# Patient Record
Sex: Female | Born: 1950 | Race: White | Hispanic: No | Smoking: Never smoker
Health system: Southern US, Community
[De-identification: ages and names within clinical notes are randomized; demographics above are authoritative.]

## PROBLEM LIST (undated history)

## (undated) DIAGNOSIS — K148 Other diseases of tongue: Secondary | ICD-10-CM

## (undated) DIAGNOSIS — M87051 Idiopathic aseptic necrosis of right femur: Secondary | ICD-10-CM

## (undated) DIAGNOSIS — F1020 Alcohol dependence, uncomplicated: Secondary | ICD-10-CM

## (undated) DIAGNOSIS — I10 Essential (primary) hypertension: Secondary | ICD-10-CM

## (undated) DIAGNOSIS — M87052 Idiopathic aseptic necrosis of left femur: Secondary | ICD-10-CM

## (undated) DIAGNOSIS — D649 Anemia, unspecified: Secondary | ICD-10-CM

## (undated) DIAGNOSIS — E785 Hyperlipidemia, unspecified: Secondary | ICD-10-CM

## (undated) DIAGNOSIS — S0285XA Fracture of orbit, unspecified, initial encounter for closed fracture: Secondary | ICD-10-CM

## (undated) DIAGNOSIS — F01518 Vascular dementia, unspecified severity, with other behavioral disturbance: Secondary | ICD-10-CM

## (undated) DIAGNOSIS — F819 Developmental disorder of scholastic skills, unspecified: Secondary | ICD-10-CM

## (undated) HISTORY — PX: OTHER SURGICAL HISTORY: SHX169

---

## 1999-09-01 ENCOUNTER — Ambulatory Visit (HOSPITAL_COMMUNITY): Admission: RE | Admit: 1999-09-01 | Discharge: 1999-09-01 | Payer: Self-pay | Admitting: Family Medicine

## 1999-09-01 ENCOUNTER — Encounter: Payer: Self-pay | Admitting: Family Medicine

## 1999-09-05 ENCOUNTER — Other Ambulatory Visit: Admission: RE | Admit: 1999-09-05 | Discharge: 1999-09-05 | Payer: Self-pay | Admitting: *Deleted

## 1999-09-05 ENCOUNTER — Encounter: Admission: RE | Admit: 1999-09-05 | Discharge: 1999-09-05 | Payer: Self-pay | Admitting: Obstetrics & Gynecology

## 1999-09-06 ENCOUNTER — Ambulatory Visit (HOSPITAL_COMMUNITY): Admission: RE | Admit: 1999-09-06 | Discharge: 1999-09-06 | Payer: Self-pay | Admitting: Family Medicine

## 1999-09-06 ENCOUNTER — Encounter: Payer: Self-pay | Admitting: Family Medicine

## 2002-04-06 ENCOUNTER — Ambulatory Visit (HOSPITAL_COMMUNITY): Admission: RE | Admit: 2002-04-06 | Discharge: 2002-04-06 | Payer: Self-pay | Admitting: Family Medicine

## 2002-04-06 ENCOUNTER — Encounter: Payer: Self-pay | Admitting: Family Medicine

## 2002-04-28 ENCOUNTER — Ambulatory Visit (HOSPITAL_COMMUNITY): Admission: RE | Admit: 2002-04-28 | Discharge: 2002-04-28 | Payer: Self-pay | Admitting: Family Medicine

## 2002-04-28 ENCOUNTER — Encounter: Payer: Self-pay | Admitting: Family Medicine

## 2007-03-11 ENCOUNTER — Emergency Department (HOSPITAL_COMMUNITY): Admission: EM | Admit: 2007-03-11 | Discharge: 2007-03-11 | Payer: Self-pay | Admitting: Emergency Medicine

## 2012-05-21 ENCOUNTER — Other Ambulatory Visit (HOSPITAL_COMMUNITY): Payer: Self-pay | Admitting: Family Medicine

## 2012-05-21 DIAGNOSIS — Z1231 Encounter for screening mammogram for malignant neoplasm of breast: Secondary | ICD-10-CM

## 2012-06-05 ENCOUNTER — Ambulatory Visit (HOSPITAL_COMMUNITY): Payer: Self-pay

## 2012-06-18 ENCOUNTER — Ambulatory Visit (HOSPITAL_COMMUNITY)
Admission: RE | Admit: 2012-06-18 | Discharge: 2012-06-18 | Disposition: A | Discharge status: Home / Self Care | Payer: Medicare Other | Source: Ambulatory Visit | Attending: Family Medicine | Admitting: Family Medicine

## 2012-06-18 DIAGNOSIS — Z1231 Encounter for screening mammogram for malignant neoplasm of breast: Secondary | ICD-10-CM

## 2015-10-03 ENCOUNTER — Other Ambulatory Visit: Payer: Self-pay | Admitting: Family Medicine

## 2015-10-03 DIAGNOSIS — Z1231 Encounter for screening mammogram for malignant neoplasm of breast: Secondary | ICD-10-CM

## 2015-10-20 ENCOUNTER — Ambulatory Visit: Payer: Medicare Other

## 2016-10-17 DIAGNOSIS — I10 Essential (primary) hypertension: Secondary | ICD-10-CM | POA: Diagnosis not present

## 2017-02-20 ENCOUNTER — Encounter (HOSPITAL_COMMUNITY): Payer: Self-pay | Admitting: Emergency Medicine

## 2017-02-20 ENCOUNTER — Emergency Department (HOSPITAL_COMMUNITY)
Admission: EM | Admit: 2017-02-20 | Discharge: 2017-02-20 | Disposition: A | Discharge status: Home / Self Care | Payer: Medicare Other | Attending: Emergency Medicine | Admitting: Emergency Medicine

## 2017-02-20 ENCOUNTER — Emergency Department (HOSPITAL_COMMUNITY): Payer: Medicare Other

## 2017-02-20 DIAGNOSIS — Y939 Activity, unspecified: Secondary | ICD-10-CM | POA: Insufficient documentation

## 2017-02-20 DIAGNOSIS — M25551 Pain in right hip: Secondary | ICD-10-CM | POA: Diagnosis not present

## 2017-02-20 DIAGNOSIS — Y92009 Unspecified place in unspecified non-institutional (private) residence as the place of occurrence of the external cause: Secondary | ICD-10-CM | POA: Insufficient documentation

## 2017-02-20 DIAGNOSIS — M25552 Pain in left hip: Secondary | ICD-10-CM

## 2017-02-20 DIAGNOSIS — I10 Essential (primary) hypertension: Secondary | ICD-10-CM | POA: Insufficient documentation

## 2017-02-20 DIAGNOSIS — Y999 Unspecified external cause status: Secondary | ICD-10-CM | POA: Diagnosis not present

## 2017-02-20 DIAGNOSIS — W1839XA Other fall on same level, initial encounter: Secondary | ICD-10-CM | POA: Insufficient documentation

## 2017-02-20 DIAGNOSIS — S79911A Unspecified injury of right hip, initial encounter: Secondary | ICD-10-CM | POA: Diagnosis not present

## 2017-02-20 HISTORY — DX: Essential (primary) hypertension: I10

## 2017-02-20 HISTORY — DX: Hyperlipidemia, unspecified: E78.5

## 2017-02-20 LAB — CBC WITH DIFFERENTIAL/PLATELET
BASOS ABS: 0 10*3/uL (ref 0.0–0.1)
BASOS PCT: 1 %
Eosinophils Absolute: 0.3 10*3/uL (ref 0.0–0.7)
Eosinophils Relative: 5 %
HCT: 31.9 % — ABNORMAL LOW (ref 36.0–46.0)
HEMOGLOBIN: 10.5 g/dL — AB (ref 12.0–15.0)
LYMPHS PCT: 33 %
Lymphs Abs: 1.8 10*3/uL (ref 0.7–4.0)
MCH: 27.3 pg (ref 26.0–34.0)
MCHC: 32.9 g/dL (ref 30.0–36.0)
MCV: 82.9 fL (ref 78.0–100.0)
MONOS PCT: 12 %
Monocytes Absolute: 0.7 10*3/uL (ref 0.1–1.0)
NEUTROS ABS: 2.7 10*3/uL (ref 1.7–7.7)
Neutrophils Relative %: 49 %
Platelets: 290 10*3/uL (ref 150–400)
RBC: 3.85 MIL/uL — AB (ref 3.87–5.11)
RDW: 13.3 % (ref 11.5–15.5)
WBC: 5.5 10*3/uL (ref 4.0–10.5)

## 2017-02-20 LAB — BASIC METABOLIC PANEL
ANION GAP: 8 (ref 5–15)
BUN: 21 mg/dL — ABNORMAL HIGH (ref 6–20)
CHLORIDE: 99 mmol/L — AB (ref 101–111)
CO2: 25 mmol/L (ref 22–32)
Calcium: 9.4 mg/dL (ref 8.9–10.3)
Creatinine, Ser: 1.05 mg/dL — ABNORMAL HIGH (ref 0.44–1.00)
GFR calc non Af Amer: 54 mL/min — ABNORMAL LOW (ref >60)
Glucose, Bld: 111 mg/dL — ABNORMAL HIGH (ref 65–99)
POTASSIUM: 4.4 mmol/L (ref 3.5–5.1)
SODIUM: 132 mmol/L — AB (ref 135–145)

## 2017-02-20 LAB — SEDIMENTATION RATE: SED RATE: 28 mm/h — AB (ref 0–22)

## 2017-02-20 NOTE — Progress Notes (Signed)
CS[facility].

## 2017-02-20 NOTE — Discharge Instructions (Signed)
I recommend taking 600 mg ibuprofen every 6 hours as needed for pain relief. You may also apply ice to affected area for 10-15 minutes 3-4 times daily. I recommend fine up with your primary care provider within the next week for follow-up evaluation. I have also listed the contact information for an orthopedic clinic below where recommend calling to schedule a follow-up appointment for follow-up evaluation and further management of your hip pain related to osteoarthritis. Return to emergency department if symptoms worsen or new onset of fever, redness, swelling, numbness, weakness, decreased range of motion, unable to ambulate.

## 2017-02-20 NOTE — ED Provider Notes (Signed)
[facility]-EMERGENCY DEPT Provider Note   CSN: [id number] Arrival date & time: 02/20/17  1459     History   Chief Complaint Chief Complaint  Patient presents with  . Hip Pain  . Alcohol Problem    HPI Norma Ayers is a 66 y.o. female.  HPI   Patient is a 66 year old female with history of hypertension, hyperlipidemia, alcohol abuse and mental impairment who presents the ED accompanied by her sister with complaint of right hip pain. Sister reports patient has been complaining of right hip pain for the past few weeks after she had a fall while drinking at home. Patient was evaluated by her PCP yesterday when she had an x-ray performed. Syrup reports they were notified by her PCP to come to the ED for further imaging of her right hip and evaluation of her alcohol abuse. Patient reports only having pain to her right hip with movement or when bearing weight. Denies radiation. Denies headache, neck pain, back pain, chest pain, abdominal pain, numbness, weakness. Patient's sister otherwise denies any recent falls or injury. Denies any use of anticoagulants. Patient denies taking any medications at home for her symptoms.  Level V caveat- mental impairment  Past Medical History:  Diagnosis Date  . Hyperlipidemia   . Hypertension     There are no active problems to display for this patient.   No past surgical history on file.  OB History    No data available       Home Medications    Prior to Admission medications   Medication Sig Start Date End Date Taking? Authorizing Provider  hydrOXYzine (ATARAX/VISTARIL) 25 MG tablet Take 25 mg by mouth daily.   Yes [provider]  lisinopril-hydrochlorothiazide (PRINZIDE,ZESTORETIC) 20-25 MG tablet Take 1 tablet by mouth daily.   Yes [provider]  lovastatin (MEVACOR) 40 MG tablet Take 40 mg by mouth daily.   Yes [provider]  meloxicam (MOBIC) 15 MG tablet Take 15 mg by mouth daily.   Yes [provider]    Family History No family history on file.  Social History Social History  Substance Use Topics  . Smoking status: Not on file  . Smokeless tobacco: Not on file  . Alcohol use Not on file     Allergies   Patient has no known allergies.   Review of Systems Review of Systems  Musculoskeletal: Positive for arthralgias (right hip).  All other systems reviewed and are negative.    Physical Exam Updated Vital Signs BP (!) 158/85 (BP Location: Right Arm)   Pulse 97   Temp 97.7 F (36.5 C) (Oral)   Resp 18   SpO2 100%   Physical Exam  Constitutional: She appears well-developed and well-nourished. No distress.  HENT:  Head: Normocephalic and atraumatic.  Eyes: Conjunctivae and EOM are normal. Right eye exhibits no discharge. Left eye exhibits no discharge. No scleral icterus.  Neck: Normal range of motion. Neck supple.  Cardiovascular: Normal rate, regular rhythm, normal heart sounds and intact distal pulses.   Pulmonary/Chest: Effort normal and breath sounds normal. No respiratory distress. She has no wheezes. She has no rales. She exhibits no tenderness.  Abdominal: Soft. Bowel sounds are normal. She exhibits no distension and no mass. There is no tenderness. There is no rebound and no guarding. No hernia.  Musculoskeletal: She exhibits tenderness. She exhibits no edema or deformity.       Right hip: She exhibits decreased range of motion, tenderness and crepitus. She  exhibits normal strength, no swelling, no deformity and no laceration.       Right knee: Normal.       Right ankle: Normal.       Right upper leg: Normal.       Right lower leg: Normal.       Right foot: Normal.  No midline C, T, or L tenderness. Full range of motion of neck and back. Full range of motion of bilateral upper and left lower extremities, with 5/5 strength. Mild TTP over right lateral hip, dec external rotation of right hip with crepitus present over right hip, FROM with internal rotation,  flexion and extension. 5/5 strength of right LE. Sensation intact. 2+ radial and PT pulses. Cap refill <2 seconds. Patient able to stand and ambulate without assistance but limps due to reported pain.    Neurological: She is alert.  Alert and oriented to person and place only, sister reports is pt's baseline.  Skin: Skin is warm and dry. She is not diaphoretic.  Nursing note and vitals reviewed.    ED Treatments / Results  Labs (all labs ordered are listed, but only abnormal results are displayed) Labs Reviewed  CBC WITH DIFFERENTIAL/PLATELET - Abnormal; Notable for the following:       Result Value   RBC 3.85 (*)    Hemoglobin 10.5 (*)    HCT 31.9 (*)    All other components within normal limits  BASIC METABOLIC PANEL - Abnormal; Notable for the following:    Sodium 132 (*)    Chloride 99 (*)    Glucose, Bld 111 (*)    BUN 21 (*)    Creatinine, Ser 1.05 (*)    GFR calc non Af Amer 54 (*)    All other components within normal limits  SEDIMENTATION RATE - Abnormal; Notable for the following:    Sed Rate 28 (*)    All other components within normal limits    EKG  EKG Interpretation None       Radiology Ct Hip Right Wo Contrast  Result Date: 02/20/2017 CLINICAL DATA:  Right hip pain after fall last week. EXAM: CT OF THE RIGHT HIP WITHOUT CONTRAST TECHNIQUE: Multidetector CT imaging of the right hip was performed according to the standard protocol. Multiplanar CT image reconstructions were also generated. COMPARISON:  Radiographs earlier this day. Pelvic radiograph 02/28/2016 FINDINGS: Bones/Joint/Cartilage Complete femoroacetabular joint space loss with subchondral cysts and scleroses sac and flattening of the femoral head. There is periarticular fragmentation. No evidence of acute fracture. Small to moderate joint effusion. Pubic rami are intact. Ligaments Suboptimally assessed by CT. Muscles and Tendons No intramuscular fluid collection. Soft tissues Mild lateral soft tissue  edema, no confluent soft tissue hematoma. Mild atherosclerosis of visualized vascular structures. IMPRESSION: 1. No evidence of acute fracture of the right hip. 2. Progression of joint space loss, subchondral cystic change and scleroses over the past year. This may be progressive osteoarthritis, or sequela of avascular necrosis. Septic arthropathy is not excluded on imaging findings alone, recommend clinical correlation. There is a nonspecific joint effusion. Electronically Signed   By: Rubye Oaks M.D.   On: 02/20/2017 18:12    Procedures Procedures (including critical care time)  Medications Ordered in ED Medications - No data to display   Initial Impression / Assessment and Plan / ED Course  I have reviewed the triage vital signs and the nursing notes.  Pertinent labs & imaging results that were available during my care of the patient  were reviewed by me and considered in my medical decision making (see chart for details).    Pt presents with continued right hip pain since falling a few weeks ago. Reports having xray performed by PCP today and was advised to come to ED for further imaging. Denies fever, weakness, numbness. VSS. Exam showed TTP over right lateral hip with dec ROM with external rotation and crepitus present to right hip. BLE otherwise neurovascularly intact.   Chart review shows right hip xray performed on 02/20/17- "Grossly abnormal appearance of the right hip with flattening and fragmentation of the femoral head, and superior subluxation. The constellation of findings may represent progression of severe osteoarthritis, sequela of prior osteomyelitis or avascular necrosis of the femoral head. Acute fracture however superimposed on severe arthritic changes is difficult to exclude. If the patient continues to be symptomatic, and more detailed evaluation of the right hip is desired, CT of the hip may be considered." PCP also advised for pt to come to ED for social services  since pt had hx of alcohol abuse with multiple falls and lives at home by herself.   CT right hip showed no acute fx, progression of joint space loss, subchondral cystic changes and scleroses over past year suggesting progressive arthritis, nonspecific small joint effusion present. No not suspect septic joint, pt has remained afebrile in ED and no erythema warmth or swelling present on exam. Consulted social worker regarding placement as requested by pt's sister (POA). Family given resources. Discussed pt with Dr. Freida BusmanAllen. Plan to order basic labs for further evaluation of possible septic joint. No leukocytosis. Mild elevation of sed rate, however due to pt remaining afebrile without swelling, erythema or warmth on exam and pt reporting constant unchanged pain for past 3 weeks I do not suspect septic joint. Suspect pt's findings are due to inflammatory changes related to osteoarthritis. Plan to d/c pt home with symptomatic tx including NSAIDs and ortho follow up. discussed results and plan for d/c with pt and family.   Final Clinical Impressions(s) / ED Diagnoses   Final diagnoses:  Left hip pain    New Prescriptions Discharge Medication List as of 02/20/2017  9:39 PM       Barrett HenleNadeau, Rashmi Tallent Elizabeth, PA-C 02/20/17 2150    Lorre NickAllen, Anthony, MD 02/21/17 1710

## 2017-02-20 NOTE — ED Triage Notes (Signed)
Pt c/o right hip pain after fall last week. Family member states PCP obtained x-ray but x-ray was unable to visualize hip sufficiently, may need MRI, per family member. Family has paper from PCP stating pt is danger to herself, abuses alcohol. A&O to self and location.

## 2017-03-01 DIAGNOSIS — M167 Other unilateral secondary osteoarthritis of hip: Secondary | ICD-10-CM | POA: Diagnosis not present

## 2017-03-01 DIAGNOSIS — M87051 Idiopathic aseptic necrosis of right femur: Secondary | ICD-10-CM | POA: Diagnosis not present

## 2017-09-07 ENCOUNTER — Emergency Department (HOSPITAL_COMMUNITY): Payer: Medicare Other

## 2017-09-07 ENCOUNTER — Inpatient Hospital Stay (HOSPITAL_COMMUNITY)
Admission: EM | Admit: 2017-09-07 | Discharge: 2017-09-12 | DRG: 641 | Disposition: A | Discharge status: Skilled Nursing Facility | Payer: Medicare Other | Attending: Internal Medicine | Admitting: Internal Medicine

## 2017-09-07 ENCOUNTER — Encounter (HOSPITAL_COMMUNITY): Payer: Self-pay | Admitting: Internal Medicine

## 2017-09-07 DIAGNOSIS — F79 Unspecified intellectual disabilities: Secondary | ICD-10-CM | POA: Diagnosis present

## 2017-09-07 DIAGNOSIS — F819 Developmental disorder of scholastic skills, unspecified: Secondary | ICD-10-CM | POA: Diagnosis not present

## 2017-09-07 DIAGNOSIS — Y92009 Unspecified place in unspecified non-institutional (private) residence as the place of occurrence of the external cause: Secondary | ICD-10-CM | POA: Diagnosis not present

## 2017-09-07 DIAGNOSIS — W010XXA Fall on same level from slipping, tripping and stumbling without subsequent striking against object, initial encounter: Secondary | ICD-10-CM | POA: Diagnosis present

## 2017-09-07 DIAGNOSIS — S0231XA Fracture of orbital floor, right side, initial encounter for closed fracture: Secondary | ICD-10-CM | POA: Diagnosis present

## 2017-09-07 DIAGNOSIS — H219 Unspecified disorder of iris and ciliary body: Secondary | ICD-10-CM | POA: Diagnosis not present

## 2017-09-07 DIAGNOSIS — S79911A Unspecified injury of right hip, initial encounter: Secondary | ICD-10-CM | POA: Diagnosis not present

## 2017-09-07 DIAGNOSIS — E785 Hyperlipidemia, unspecified: Secondary | ICD-10-CM | POA: Diagnosis present

## 2017-09-07 DIAGNOSIS — S0993XA Unspecified injury of face, initial encounter: Secondary | ICD-10-CM | POA: Diagnosis not present

## 2017-09-07 DIAGNOSIS — M87851 Other osteonecrosis, right femur: Secondary | ICD-10-CM | POA: Diagnosis present

## 2017-09-07 DIAGNOSIS — S0230XB Fracture of orbital floor, unspecified side, initial encounter for open fracture: Secondary | ICD-10-CM | POA: Diagnosis not present

## 2017-09-07 DIAGNOSIS — F0390 Unspecified dementia without behavioral disturbance: Secondary | ICD-10-CM | POA: Diagnosis present

## 2017-09-07 DIAGNOSIS — S0990XA Unspecified injury of head, initial encounter: Secondary | ICD-10-CM | POA: Diagnosis not present

## 2017-09-07 DIAGNOSIS — E876 Hypokalemia: Secondary | ICD-10-CM | POA: Diagnosis present

## 2017-09-07 DIAGNOSIS — D509 Iron deficiency anemia, unspecified: Secondary | ICD-10-CM | POA: Diagnosis present

## 2017-09-07 DIAGNOSIS — S0285XA Fracture of orbit, unspecified, initial encounter for closed fracture: Secondary | ICD-10-CM

## 2017-09-07 DIAGNOSIS — E878 Other disorders of electrolyte and fluid balance, not elsewhere classified: Secondary | ICD-10-CM | POA: Diagnosis present

## 2017-09-07 DIAGNOSIS — F1020 Alcohol dependence, uncomplicated: Secondary | ICD-10-CM | POA: Diagnosis present

## 2017-09-07 DIAGNOSIS — H9190 Unspecified hearing loss, unspecified ear: Secondary | ICD-10-CM | POA: Diagnosis present

## 2017-09-07 DIAGNOSIS — Z8249 Family history of ischemic heart disease and other diseases of the circulatory system: Secondary | ICD-10-CM

## 2017-09-07 DIAGNOSIS — T502X5A Adverse effect of carbonic-anhydrase inhibitors, benzothiadiazides and other diuretics, initial encounter: Secondary | ICD-10-CM | POA: Diagnosis present

## 2017-09-07 DIAGNOSIS — E860 Dehydration: Secondary | ICD-10-CM | POA: Diagnosis present

## 2017-09-07 DIAGNOSIS — K148 Other diseases of tongue: Secondary | ICD-10-CM | POA: Diagnosis not present

## 2017-09-07 DIAGNOSIS — E871 Hypo-osmolality and hyponatremia: Principal | ICD-10-CM | POA: Diagnosis present

## 2017-09-07 DIAGNOSIS — H5704 Mydriasis: Secondary | ICD-10-CM | POA: Diagnosis not present

## 2017-09-07 DIAGNOSIS — M87852 Other osteonecrosis, left femur: Secondary | ICD-10-CM | POA: Diagnosis present

## 2017-09-07 DIAGNOSIS — Z791 Long term (current) use of non-steroidal anti-inflammatories (NSAID): Secondary | ICD-10-CM | POA: Diagnosis not present

## 2017-09-07 DIAGNOSIS — G8911 Acute pain due to trauma: Secondary | ICD-10-CM | POA: Diagnosis not present

## 2017-09-07 DIAGNOSIS — E7800 Pure hypercholesterolemia, unspecified: Secondary | ICD-10-CM | POA: Diagnosis not present

## 2017-09-07 DIAGNOSIS — S01111A Laceration without foreign body of right eyelid and periocular area, initial encounter: Secondary | ICD-10-CM | POA: Diagnosis present

## 2017-09-07 DIAGNOSIS — S098XXA Other specified injuries of head, initial encounter: Secondary | ICD-10-CM | POA: Diagnosis not present

## 2017-09-07 DIAGNOSIS — R7989 Other specified abnormal findings of blood chemistry: Secondary | ICD-10-CM | POA: Diagnosis present

## 2017-09-07 DIAGNOSIS — M160 Bilateral primary osteoarthritis of hip: Secondary | ICD-10-CM | POA: Diagnosis present

## 2017-09-07 DIAGNOSIS — K1321 Leukoplakia of oral mucosa, including tongue: Secondary | ICD-10-CM | POA: Diagnosis present

## 2017-09-07 DIAGNOSIS — I10 Essential (primary) hypertension: Secondary | ICD-10-CM | POA: Diagnosis present

## 2017-09-07 DIAGNOSIS — S0500XA Injury of conjunctiva and corneal abrasion without foreign body, unspecified eye, initial encounter: Secondary | ICD-10-CM | POA: Diagnosis not present

## 2017-09-07 DIAGNOSIS — R220 Localized swelling, mass and lump, head: Secondary | ICD-10-CM | POA: Diagnosis not present

## 2017-09-07 DIAGNOSIS — S0181XA Laceration without foreign body of other part of head, initial encounter: Secondary | ICD-10-CM | POA: Diagnosis not present

## 2017-09-07 DIAGNOSIS — E782 Mixed hyperlipidemia: Secondary | ICD-10-CM | POA: Diagnosis present

## 2017-09-07 DIAGNOSIS — Z79899 Other long term (current) drug therapy: Secondary | ICD-10-CM

## 2017-09-07 DIAGNOSIS — M87052 Idiopathic aseptic necrosis of left femur: Secondary | ICD-10-CM

## 2017-09-07 DIAGNOSIS — M87051 Idiopathic aseptic necrosis of right femur: Secondary | ICD-10-CM

## 2017-09-07 DIAGNOSIS — S199XXA Unspecified injury of neck, initial encounter: Secondary | ICD-10-CM | POA: Diagnosis not present

## 2017-09-07 DIAGNOSIS — M25552 Pain in left hip: Secondary | ICD-10-CM | POA: Diagnosis not present

## 2017-09-07 DIAGNOSIS — S299XXA Unspecified injury of thorax, initial encounter: Secondary | ICD-10-CM | POA: Diagnosis not present

## 2017-09-07 DIAGNOSIS — M25551 Pain in right hip: Secondary | ICD-10-CM | POA: Diagnosis not present

## 2017-09-07 HISTORY — DX: Idiopathic aseptic necrosis of right femur: M87.051

## 2017-09-07 HISTORY — DX: Idiopathic aseptic necrosis of left femur: M87.052

## 2017-09-07 HISTORY — DX: Fracture of orbit, unspecified, initial encounter for closed fracture: S02.85XA

## 2017-09-07 HISTORY — DX: Other diseases of tongue: K14.8

## 2017-09-07 HISTORY — DX: Developmental disorder of scholastic skills, unspecified: F81.9

## 2017-09-07 HISTORY — DX: Alcohol dependence, uncomplicated: F10.20

## 2017-09-07 LAB — BASIC METABOLIC PANEL
ANION GAP: 13 (ref 5–15)
ANION GAP: 11 (ref 5–15)
BUN: 21 mg/dL — ABNORMAL HIGH (ref 6–20)
BUN: 19 mg/dL (ref 6–20)
CALCIUM: 8.4 mg/dL — AB (ref 8.9–10.3)
CHLORIDE: 85 mmol/L — AB (ref 101–111)
CO2: 23 mmol/L (ref 22–32)
CO2: 20 mmol/L — AB (ref 22–32)
CREATININE: 1.18 mg/dL — AB (ref 0.44–1.00)
Calcium: 8.9 mg/dL (ref 8.9–10.3)
Chloride: 80 mmol/L — ABNORMAL LOW (ref 101–111)
Creatinine, Ser: 1.04 mg/dL — ABNORMAL HIGH (ref 0.44–1.00)
GFR calc Af Amer: 54 mL/min — ABNORMAL LOW (ref >60)
GFR calc Af Amer: >60 mL/min (ref >60)
GFR calc non Af Amer: 46 mL/min — ABNORMAL LOW (ref >60)
GFR calc non Af Amer: 54 mL/min — ABNORMAL LOW (ref >60)
GLUCOSE: 103 mg/dL — AB (ref 65–99)
Glucose, Bld: 105 mg/dL — ABNORMAL HIGH (ref 65–99)
POTASSIUM: 3.2 mmol/L — AB (ref 3.5–5.1)
Potassium: 3.2 mmol/L — ABNORMAL LOW (ref 3.5–5.1)
SODIUM: 116 mmol/L — AB (ref 135–145)
Sodium: 116 mmol/L — CL (ref 135–145)

## 2017-09-07 LAB — URINALYSIS, ROUTINE W REFLEX MICROSCOPIC
Bacteria, UA: NONE SEEN
Bilirubin Urine: NEGATIVE
GLUCOSE, UA: NEGATIVE mg/dL
Hgb urine dipstick: NEGATIVE
KETONES UR: 5 mg/dL — AB
Nitrite: NEGATIVE
PH: 5 (ref 5.0–8.0)
Protein, ur: NEGATIVE mg/dL
Specific Gravity, Urine: 1.01 (ref 1.005–1.030)

## 2017-09-07 LAB — CK: CK TOTAL: 437 U/L — AB (ref 38–234)

## 2017-09-07 LAB — CBC WITH DIFFERENTIAL/PLATELET
BASOS ABS: 0 10*3/uL (ref 0.0–0.1)
Basophils Relative: 0 %
EOS ABS: 0.1 10*3/uL (ref 0.0–0.7)
EOS PCT: 1 %
HCT: 30.4 % — ABNORMAL LOW (ref 36.0–46.0)
Hemoglobin: 10.8 g/dL — ABNORMAL LOW (ref 12.0–15.0)
LYMPHS PCT: 13 %
Lymphs Abs: 0.9 10*3/uL (ref 0.7–4.0)
MCH: 27.1 pg (ref 26.0–34.0)
MCHC: 35.5 g/dL (ref 30.0–36.0)
MCV: 76.2 fL — AB (ref 78.0–100.0)
Monocytes Absolute: 0.5 10*3/uL (ref 0.1–1.0)
Monocytes Relative: 6 %
NEUTROS PCT: 80 %
Neutro Abs: 6.1 10*3/uL (ref 1.7–7.7)
PLATELETS: 298 10*3/uL (ref 150–400)
RBC: 3.99 MIL/uL (ref 3.87–5.11)
RDW: 12.2 % (ref 11.5–15.5)
WBC: 7.5 10*3/uL (ref 4.0–10.5)

## 2017-09-07 LAB — CBC
HCT: 28 % — ABNORMAL LOW (ref 36.0–46.0)
Hemoglobin: 10 g/dL — ABNORMAL LOW (ref 12.0–15.0)
MCH: 27.2 pg (ref 26.0–34.0)
MCHC: 35.7 g/dL (ref 30.0–36.0)
MCV: 76.1 fL — AB (ref 78.0–100.0)
PLATELETS: 252 10*3/uL (ref 150–400)
RBC: 3.68 MIL/uL — ABNORMAL LOW (ref 3.87–5.11)
RDW: 11.8 % (ref 11.5–15.5)
WBC: 6.6 10*3/uL (ref 4.0–10.5)

## 2017-09-07 LAB — SODIUM, URINE, RANDOM: SODIUM UR: 14 mmol/L

## 2017-09-07 LAB — OSMOLALITY, URINE: Osmolality, Ur: 345 mOsm/kg (ref 300–900)

## 2017-09-07 MED ORDER — FOLIC ACID 1 MG PO TABS
1.0000 mg | ORAL_TABLET | Freq: Every day | ORAL | Status: DC
  Administered 2017-09-08 – 2017-09-12 (×5): 1 mg via ORAL
  Filled 2017-09-07 (×5): qty 1

## 2017-09-07 MED ORDER — SODIUM CHLORIDE 0.9 % IV BOLUS (SEPSIS)
500.0000 mL | Freq: Once | INTRAVENOUS | Status: AC
  Administered 2017-09-07: 500 mL via INTRAVENOUS

## 2017-09-07 MED ORDER — ONDANSETRON HCL 4 MG/2ML IJ SOLN: 4.0000 mg | Freq: Four times a day (QID) | INTRAMUSCULAR | Status: DC | PRN

## 2017-09-07 MED ORDER — ACETAMINOPHEN 325 MG PO TABS
650.0000 mg | ORAL_TABLET | Freq: Four times a day (QID) | ORAL | Status: DC | PRN
  Administered 2017-09-08: 650 mg via ORAL
  Filled 2017-09-07: qty 2

## 2017-09-07 MED ORDER — ACETAMINOPHEN 650 MG RE SUPP: 650.0000 mg | Freq: Four times a day (QID) | RECTAL | Status: DC | PRN

## 2017-09-07 MED ORDER — VITAMIN B-1 100 MG PO TABS
100.0000 mg | ORAL_TABLET | Freq: Every day | ORAL | Status: DC
  Administered 2017-09-08 – 2017-09-12 (×5): 100 mg via ORAL
  Filled 2017-09-07 (×5): qty 1

## 2017-09-07 MED ORDER — ONDANSETRON HCL 4 MG PO TABS: 4.0000 mg | ORAL_TABLET | Freq: Four times a day (QID) | ORAL | Status: DC | PRN

## 2017-09-07 MED ORDER — LACTATED RINGERS IV BOLUS (SEPSIS): 1000.0000 mL | Freq: Once | INTRAVENOUS | Status: DC

## 2017-09-07 MED ORDER — DESMOPRESSIN ACETATE 4 MCG/ML IJ SOLN
1.0000 ug | Freq: Three times a day (TID) | INTRAMUSCULAR | Status: DC
  Administered 2017-09-08 – 2017-09-09 (×6): 1 ug via SUBCUTANEOUS
  Filled 2017-09-07 (×4): qty 1

## 2017-09-07 MED ORDER — HEPARIN SODIUM (PORCINE) 5000 UNIT/ML IJ SOLN
5000.0000 [IU] | Freq: Three times a day (TID) | INTRAMUSCULAR | Status: DC
  Administered 2017-09-08: 5000 [IU] via SUBCUTANEOUS
  Filled 2017-09-07: qty 1

## 2017-09-07 MED ORDER — SODIUM CHLORIDE 0.9 % IV SOLN
Freq: Once | INTRAVENOUS | Status: AC
Start: 2017-09-07 — End: 2017-09-07
  Administered 2017-09-07: 20:00:00 via INTRAVENOUS

## 2017-09-07 MED ORDER — SODIUM CHLORIDE 0.9 % IV SOLN
INTRAVENOUS | Status: AC
  Administered 2017-09-08 (×2): via INTRAVENOUS

## 2017-09-07 MED ORDER — LACTATED RINGERS IV BOLUS (SEPSIS)
500.0000 mL | Freq: Once | INTRAVENOUS | Status: AC
  Administered 2017-09-07: 500 mL via INTRAVENOUS

## 2017-09-07 MED ORDER — ADULT MULTIVITAMIN W/MINERALS CH
1.0000 | ORAL_TABLET | Freq: Every day | ORAL | Status: DC
  Administered 2017-09-08 – 2017-09-12 (×5): 1 via ORAL
  Filled 2017-09-07 (×5): qty 1

## 2017-09-07 NOTE — ED Notes (Signed)
Family at bedside. EDP notified.

## 2017-09-07 NOTE — ED Notes (Signed)
Patient transported to CT 

## 2017-09-07 NOTE — ED Provider Notes (Signed)
[facility]   CSN: [id number] Arrival date & time: 09/07/17  1517     History   Chief Complaint Chief Complaint  Patient presents with  . Fall    HPI Norma Ayers is a 66 y.o. female.  The history is provided by the patient and the EMS personnel.  66yoF with hx of HLD and HTN who is not on anticoagulation presenting after an unwitnessed fall last night.  Per the family the patient is.  Patient is mentally disabled and does not answer questions appropriately and is altered at baseline.  The patient will only say what her name is but is unable to give any other history or information.  She lives alone and her son found her this morning with bruising to the right side of her face.  Does not know how she fell or if there was any loss of consciousness.  Unsure of when this actually happened.  She has not complained of pain to the family.  Per the family she is currently acting abdominal and answering questions at her baseline.  She has not had any recent fevers that they are aware of.  Unsure if she had any symptoms prior to the fall.  Past Medical History:  Diagnosis Date  . Hyperlipidemia   . Hypertension     Patient Active Problem List   Diagnosis Date Noted  . Hyponatremia 09/07/2017    OB History    No data available       Home Medications    Prior to Admission medications   Medication Sig Start Date End Date Taking? Authorizing Provider  hydrOXYzine (ATARAX/VISTARIL) 25 MG tablet Take 25 mg by mouth daily.    [provider]  lisinopril-hydrochlorothiazide (PRINZIDE,ZESTORETIC) 20-25 MG tablet Take 1 tablet by mouth daily.    [provider]  lovastatin (MEVACOR) 40 MG tablet Take 40 mg by mouth daily.    [provider]  meloxicam (MOBIC) 15 MG tablet Take 15 mg by mouth daily.    [provider]    Family History No family history on file.  Social History Social History     Tobacco Use  . Smoking status: Not on file  Substance Use Topics  . Alcohol use: Not on file  . Drug use: Not on file     Allergies   Patient has no known allergies.   Review of Systems Review of Systems  Unable to perform ROS: Other  Pt unable to answer questions appropriately   Physical Exam Updated Vital Signs BP (!) 144/85   Pulse 89   Temp 98.5 F (36.9 C) (Oral)   Resp 17   SpO2 100%   Physical Exam  Constitutional: She appears well-developed and well-nourished. No distress.  HENT:  Head: Normocephalic. Head is with contusion.  eccymosis and swelling to R orbit. No proptosis noted.  TTP to the R orbit no TTP elsewhere. 2cm laceration to R upper eyelid that is hemostatic and appears healing.  Eyes: Conjunctivae are normal. Right pupil is reactive. Left pupil is not reactive. Pupils are unequal.  Pt difficult to examine as she does not follow commands, does move the right eye in all directions but not far. No obvious signs of entrapment.  Neck: Trachea normal. Neck supple. No spinous process tenderness present. No neck rigidity. No tracheal deviation present.  Cardiovascular: Normal rate, regular rhythm, S1 normal, S2 normal, normal heart sounds, intact distal pulses and normal pulses.  Pulmonary/Chest:  Effort normal and breath sounds normal. No tachypnea. No respiratory distress. She has no decreased breath sounds. She has no rhonchi. She exhibits no tenderness and no crepitus.  Abdominal: Soft. She exhibits no distension. There is no tenderness.  Musculoskeletal:  No obvious trauma to the extremities. No pelvic instability. Does have mild TTP to the R hip.  Neurological: She is alert. She is disoriented. No cranial nerve deficit. GCS eye subscore is 4. GCS verbal subscore is 4. GCS motor subscore is 5.  Moves all extremities appropriately but does not follow commands.  Nursing note and vitals reviewed.    ED Treatments / Results  Labs (all labs ordered are  listed, but only abnormal results are displayed) Labs Reviewed  CBC WITH DIFFERENTIAL/PLATELET - Abnormal; Notable for the following components:      Result Value   Hemoglobin 10.8 (*)    HCT 30.4 (*)    MCV 76.2 (*)    All other components within normal limits  BASIC METABOLIC PANEL - Abnormal; Notable for the following components:   Sodium 116 (*)    Potassium 3.2 (*)    Chloride 80 (*)    Glucose, Bld 105 (*)    BUN 21 (*)    Creatinine, Ser 1.18 (*)    GFR calc non Af Amer 46 (*)    GFR calc Af Amer 54 (*)    All other components within normal limits  CK - Abnormal; Notable for the following components:   Total CK 437 (*)    All other components within normal limits  URINALYSIS, ROUTINE W REFLEX MICROSCOPIC  BASIC METABOLIC PANEL  SODIUM, URINE, RANDOM  OSMOLALITY, URINE    EKG  EKG Interpretation None       Radiology Dg Chest 1 View  Result Date: 09/07/2017 CLINICAL DATA:  66 year old female status post fall last night. EXAM: CHEST 1 VIEW COMPARISON:  None. FINDINGS: The heart size and mediastinal contours are within normal limits. Both lungs are clear. The visualized skeletal structures are unremarkable. IMPRESSION: No active disease. Electronically Signed   By: Sande BrothersSerena  Chacko M.D.   On: 09/07/2017 16:42   Ct Head Wo Contrast  Result Date: 09/07/2017 CLINICAL DATA:  Patient fell last evening with bruising and swelling of the right eye. Laceration of the eyebrow. EXAM: CT HEAD WITHOUT CONTRAST CT MAXILLOFACIAL WITHOUT CONTRAST CT CERVICAL SPINE WITHOUT CONTRAST TECHNIQUE: Multidetector CT imaging of the head, cervical spine, and maxillofacial structures were performed using the standard protocol without intravenous contrast. Multiplanar CT image reconstructions of the cervical spine and maxillofacial structures were also generated. COMPARISON:  None. FINDINGS: CT HEAD FINDINGS Brain: Mild sulcal and moderate ventricular prominence consistent with superficial and  central atrophy. Chronic appearing mild to moderate small vessel ischemic disease of periventricular white matter. No acute intracranial hemorrhage, midline shift or edema. No large vascular territory infarct. No extra-axial collections. Vascular: No hyperdense vessels. Moderate atherosclerosis of the cavernous internal carotids and both vertebral arteries. Skull: Intact zygomatic arches.  Intact bony calvarium. Other: None CT MAXILLOFACIAL FINDINGS Osseous: Acute right orbital floor fracture with fracture also involving the canal for the right maxillary nerve. 3 mm of caudal displacement of the fracture fragment is identified. Herniation of orbital fat through the fracture is identified with slight herniation of the nerve as well. Associated medial wall fracture of the orbit is also noted. Intact zygomatic arches. Intact pterygoid plates. The temporomandibular joints are maintained. No mandibular fracture. Nasal bones are intact. Anterior maxillary process is intact. Orbits:  Medial right orbital extraconal hemorrhage/edema measuring 2 mm in thickness, series 13, image 31. Small focus of intraconal hemorrhage also noted, series 13, image 34 with retrobulbar or mild edema. The globes appear intact. Sinuses: Hyperdense blood products and herniated retrobulbar fat is noted within the right maxillary sinus. Soft tissues: Right periorbital soft tissue swelling is noted. CT CERVICAL SPINE FINDINGS Alignment: Slight straightening of cervical lordosis. Minimal grade 1 anterolisthesis of C4 on C5. Intact craniocervical relationship. Osteoarthritis of the atlantodental interval. Skull base and vertebrae: No acute fracture. No primary bone lesion or focal pathologic process. Soft tissues and spinal canal: No prevertebral fluid or swelling. No visible canal hematoma. Disc levels: Mild disc space narrowing C4-5, C5-6 and C6-7. No focal disc herniations or significant canal stenosis. Facet arthropathy on the left with posterior  marginal osteophytes at C4-5 contribute to left-sided neural foraminal encroachment. Upper chest: No acute abnormality Other: None IMPRESSION: 1. Acute right orbital floor and medial orbital wall fractures with fracture involving the canal for the right maxillary nerve seen along the medial orbital floor. There is 3 mm of caudal displacement of the orbital floor fracture with herniation of periorbital fat and minimal herniation of the right maxillary nerve. 2. Associated with the right orbital fractures are a small amount of medial extraconal hemorrhage/fluid as well as small amount of lateral intraconal hemorrhage. Hyperdense fluid in the right maxillary sinus would be in keeping with hemorrhage/blood products. Right periorbital soft tissue swelling is noted. 3. No acute intracranial abnormality. 4. Mild cervical spondylosis without acute cervical spine fracture. Electronically Signed   By: Tollie Eth M.D.   On: 09/07/2017 16:46   Ct Cervical Spine Wo Contrast  Result Date: 09/07/2017 CLINICAL DATA:  Patient fell last evening with bruising and swelling of the right eye. Laceration of the eyebrow. EXAM: CT HEAD WITHOUT CONTRAST CT MAXILLOFACIAL WITHOUT CONTRAST CT CERVICAL SPINE WITHOUT CONTRAST TECHNIQUE: Multidetector CT imaging of the head, cervical spine, and maxillofacial structures were performed using the standard protocol without intravenous contrast. Multiplanar CT image reconstructions of the cervical spine and maxillofacial structures were also generated. COMPARISON:  None. FINDINGS: CT HEAD FINDINGS Brain: Mild sulcal and moderate ventricular prominence consistent with superficial and central atrophy. Chronic appearing mild to moderate small vessel ischemic disease of periventricular white matter. No acute intracranial hemorrhage, midline shift or edema. No large vascular territory infarct. No extra-axial collections. Vascular: No hyperdense vessels. Moderate atherosclerosis of the cavernous  internal carotids and both vertebral arteries. Skull: Intact zygomatic arches.  Intact bony calvarium. Other: None CT MAXILLOFACIAL FINDINGS Osseous: Acute right orbital floor fracture with fracture also involving the canal for the right maxillary nerve. 3 mm of caudal displacement of the fracture fragment is identified. Herniation of orbital fat through the fracture is identified with slight herniation of the nerve as well. Associated medial wall fracture of the orbit is also noted. Intact zygomatic arches. Intact pterygoid plates. The temporomandibular joints are maintained. No mandibular fracture. Nasal bones are intact. Anterior maxillary process is intact. Orbits: Medial right orbital extraconal hemorrhage/edema measuring 2 mm in thickness, series 13, image 31. Small focus of intraconal hemorrhage also noted, series 13, image 34 with retrobulbar or mild edema. The globes appear intact. Sinuses: Hyperdense blood products and herniated retrobulbar fat is noted within the right maxillary sinus. Soft tissues: Right periorbital soft tissue swelling is noted. CT CERVICAL SPINE FINDINGS Alignment: Slight straightening of cervical lordosis. Minimal grade 1 anterolisthesis of C4 on C5. Intact craniocervical relationship. Osteoarthritis of the atlantodental  interval. Skull base and vertebrae: No acute fracture. No primary bone lesion or focal pathologic process. Soft tissues and spinal canal: No prevertebral fluid or swelling. No visible canal hematoma. Disc levels: Mild disc space narrowing C4-5, C5-6 and C6-7. No focal disc herniations or significant canal stenosis. Facet arthropathy on the left with posterior marginal osteophytes at C4-5 contribute to left-sided neural foraminal encroachment. Upper chest: No acute abnormality Other: None IMPRESSION: 1. Acute right orbital floor and medial orbital wall fractures with fracture involving the canal for the right maxillary nerve seen along the medial orbital floor. There  is 3 mm of caudal displacement of the orbital floor fracture with herniation of periorbital fat and minimal herniation of the right maxillary nerve. 2. Associated with the right orbital fractures are a small amount of medial extraconal hemorrhage/fluid as well as small amount of lateral intraconal hemorrhage. Hyperdense fluid in the right maxillary sinus would be in keeping with hemorrhage/blood products. Right periorbital soft tissue swelling is noted. 3. No acute intracranial abnormality. 4. Mild cervical spondylosis without acute cervical spine fracture. Electronically Signed   By: Tollie Eth M.D.   On: 09/07/2017 16:46   Ct Pelvis Wo Contrast  Result Date: 09/07/2017 CLINICAL DATA:  Bilateral hip pain after fall yesterday. EXAM: CT PELVIS WITHOUT CONTRAST TECHNIQUE: Multidetector CT imaging of the pelvis was performed following the standard protocol without intravenous contrast. COMPARISON:  Radiograph earlier this day.  Right hip CT 02/20/2017 FINDINGS: Urinary Tract: Bladder physiologically distended. Distal ureters are decompressed. Bowel: No acute abnormality. Distal colonic diverticulosis. No bowel inflammation. Vascular/Lymphatic: Aorto bi-iliac atherosclerosis. No distal aneurysm. Small bilateral external iliac nodes. Reproductive: Unchanged 2.1 cm cyst in the right ovary. Uterine calcification likely fibroid. Left ovary is quiescent. Other:  No pelvic free fluid. Musculoskeletal: No acute fracture. Advanced arthropathy of bilateral hips with femoral head flattening and collapse, subchondral cystic change and scleroses, and bony fragmentation. Bilateral hip joint effusions. Findings in the right hip have progressed from prior CT. Pubic rami are intact. Sacroiliac joints and pubic symphysis are congruent. IMPRESSION: 1. No acute fracture of the pelvis or hips. 2. Bilateral hip AVN and advanced osteoarthritis, findings have progressed in the right hip from CT 6 months prior. Electronically Signed    By: Rubye Oaks M.D.   On: 09/07/2017 19:56   Dg Hip Unilat W Or Wo Pelvis 2-3 Views Right  Result Date: 09/07/2017 CLINICAL DATA:  66 year old female with history of trauma from a fall yesterday evening. Right-sided hip pain. EXAM: DG HIP (WITH OR WITHOUT PELVIS) 2-3V RIGHT COMPARISON:  02/20/2017. 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 Flattening of the left femoral head has significantly progressed compared to the prior study. There is also extensive joint space narrowing, subchondral sclerosis, subchondral cyst formation and osteophyte formation in the hip joints bilaterally compatible with advanced bilateral osteoarthritis. Chronic superior and lateral subluxation of the right femoral head is similar to the prior study. No acute displaced fracture. IMPRESSION: 1. No acute abnormality of the bony pelvis or the right hip. 2. 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 Electronically Signed   By: Trudie Reed M.D.   On: 09/07/2017 16:52   Ct Maxillofacial Wo Contrast  Result Date: 09/07/2017 CLINICAL DATA:  Patient fell last evening with bruising and swelling of the right eye. Laceration of the eyebrow. EXAM: CT HEAD WITHOUT CONTRAST CT MAXILLOFACIAL WITHOUT CONTRAST CT CERVICAL SPINE WITHOUT CONTRAST TECHNIQUE: Multidetector CT imaging of the head, cervical spine, and maxillofacial structures were performed using the standard protocol without intravenous contrast. Multiplanar CT image reconstructions of the cervical spine and maxillofacial structures were also generated. COMPARISON:  None. FINDINGS: CT HEAD FINDINGS Brain: Mild  sulcal and moderate ventricular prominence consistent with superficial and central atrophy. Chronic appearing mild to moderate small vessel ischemic disease of periventricular white matter. No acute intracranial hemorrhage, midline shift or edema. No large vascular territory infarct. No extra-axial collections. Vascular: No hyperdense vessels. Moderate atherosclerosis of the cavernous internal carotids and both vertebral arteries. Skull: Intact zygomatic arches.  Intact bony calvarium. Other: None CT MAXILLOFACIAL FINDINGS Osseous: Acute right orbital floor fracture with fracture also involving the canal for the right maxillary nerve. 3 mm of caudal displacement of the fracture fragment is identified. Herniation of orbital fat through the fracture is identified with slight herniation of the nerve as well. Associated medial wall fracture of the orbit is also noted. Intact zygomatic arches. Intact pterygoid plates. The temporomandibular joints are maintained. No mandibular fracture. Nasal bones are intact. Anterior maxillary process is intact. Orbits: Medial right orbital extraconal hemorrhage/edema measuring 2 mm in thickness, series 13, image 31. Small focus of intraconal hemorrhage also noted, series 13, image 34 with retrobulbar or mild edema. The globes appear intact. Sinuses: Hyperdense blood products and herniated retrobulbar fat is noted within the right maxillary sinus. Soft tissues: Right periorbital soft tissue swelling is noted. CT CERVICAL SPINE FINDINGS Alignment: Slight straightening of cervical lordosis. Minimal grade 1 anterolisthesis of C4 on C5. Intact craniocervical relationship. Osteoarthritis of the atlantodental interval. Skull base and vertebrae: No acute fracture. No primary bone lesion or focal pathologic process. Soft tissues and spinal canal: No prevertebral fluid or swelling. No visible canal hematoma. Disc levels: Mild disc space narrowing C4-5, C5-6 and C6-7. No focal disc herniations or  significant canal stenosis. Facet arthropathy on the left with posterior marginal osteophytes at C4-5 contribute to left-sided neural foraminal encroachment. Upper chest: No acute abnormality Other: None IMPRESSION: 1. Acute right orbital floor and medial orbital wall fractures with fracture involving the canal for the right maxillary nerve seen along the medial orbital floor. There is 3 mm of caudal displacement of the orbital floor fracture with herniation of periorbital fat and minimal herniation of the right maxillary nerve. 2. Associated with the right orbital fractures are a small amount of medial extraconal hemorrhage/fluid as well as small amount of lateral intraconal hemorrhage. Hyperdense fluid in the right maxillary sinus would be in keeping with hemorrhage/blood products. Right periorbital soft tissue swelling is noted. 3. No acute intracranial abnormality. 4. Mild cervical spondylosis without acute cervical spine fracture. Electronically Signed   By: Tollie Eth M.D.   On: 09/07/2017 16:46    Procedures Procedures (including critical care time)  Medications Ordered in ED Medications  lactated ringers bolus 500 mL (0 mLs Intravenous Stopped 09/07/17 2019)  0.9 %  sodium chloride infusion ( Intravenous New Bag/Given 09/07/17 2020)  sodium chloride 0.9 % bolus 500 mL (500 mLs Intravenous Bolus from Bag 09/07/17 2149)     Initial Impression / Assessment and Plan / ED Course  I have reviewed the triage vital signs and the nursing notes.  Pertinent labs & imaging results that were available during  my care of the patient were reviewed by me and considered in my medical decision making (see chart for details).     66 year old female with a history of mental handicap per the family presenting with an unwitnessed fall sometime yesterday.  Unsure when she fell.  Unknown if there was loss of consciousness.  On arrival she is afebrile and hemodynamic stable.  ABCs intact.  Exam is as above.  The  patient is unable to provide any further history, CT of the head, face, C-spine as well as x-ray of the chest and hip ordered.  CBC, BMP, CK and UA ordered.  Family at bedside states that she is mentally at her baseline and acting normally.  She has a 2 cm laceration to her right eyelid that is hemostatic and appears that it is healing and more than 1 day old therefore will not be repaired.  Skin is notable for a right orbital floor and medial orbital wall fracture with 3 mm of caudal displacement of the orbital floor.  No obvious signs of entrapment on exam although patient does not participate in the exam very well.  ENT trauma consulted and will see the patient during her admission. Chest x-ray and hip x-ray unremarkable although she still complained of hip pain therefore a CT pelvis ordered. Labs notable for hyponatremia to 116, hypochloremia.  CK 437. Initially an LR bolus was ordered by accident however the patient did not receive any LR and the order was changed to normal saline.  She received a 500 cc saline bolus.   Patient discussed with the hospital service and will be admitted for further management of hyponatremia. Family amenable with plan.     Final Clinical Impressions(s) / ED Diagnoses   Final diagnoses:  Hyponatremia    ED Discharge Orders    None       Krithika Tome [place], MD 09/07/17 1610    Blane Ohara, MD 09/07/17 2326

## 2017-09-07 NOTE — ED Notes (Signed)
Delay in lab draw,  MD at bedside. 

## 2017-09-07 NOTE — H&P (Signed)
History and Physical    Norma Ayers ZOX:[id number] DOB: 14-Oct-1951 DOA: 09/07/2017  PCP: Richmond Campbell., PA-C  Patient coming from: Home   Chief Complaint: found down  HPI: Norma Ayers is a 66 y.o. female with medical history significant of HTN, HLD, chronic itching who presents after being found down.  Norma Ayers lives alone and was found down by her son today.  The family thinks that she was down for about a day.  Patient reports that she tripped and fell face first and then could not get up.  The patient is extremely hard of hearing and responds yes to most questions.  Family also has a difficult time getting her to understand them.  There is also reported cognitive delay.  Sister was in the room, [name], and reported that the patient has been having chronic hip pain from bad osteoarthritis and the family thinks this is what caused her to trip.  From what she knows of having seen her over the holidays, Norma Ayers had expressed no recent illness, no vomiting, fever, chills or any issues.  Norma Ayers notes that Norma Ayers does not eat well and she is an alcoholic.  The family has been trying to cut down on her ETOH intake, but they know she continues to drink at least 2 times per week.  Her son is also known to bring her ETOH semi regularly, so it is not clear if she is drinking daily currently.  This has been an ongoing issue for at least 1 year.  She does appear to be taking her medications, [name] will fill a pill box for her weekly and it is returned without the medications.  Her medications include a thiazide diuretic.    ED Course: In the ED, she was found to have orbital fractures on the right.  She was also found to have a Na of 116, chronicity unclear at this time.  Her last Na from 6 months ago was 132.  She had a CK of around 400.    Review of Systems: As per HPI otherwise 10 point review of systems negative, though this was completed with the help of her sister who had not seen her in at  least 24 hours.   Past Medical History:  Diagnosis Date  . Hyperlipidemia   . Hypertension     Past Surgical History:  Procedure Laterality Date  . gum tumor     removed     reports that  has never smoked. She does not have any smokeless tobacco history on file. She reports that she drinks alcohol. She reports that she does not use drugs.  No Known Allergies  Family History  Problem Relation Age of Onset  . Emphysema Mother   . Hypertension Sister   . Heart attack Brother     Prior to Admission medications   Medication Sig Start Date End Date Taking? Authorizing Provider  hydrOXYzine (ATARAX/VISTARIL) 25 MG tablet Take 25 mg by mouth daily.    [provider]  lisinopril-hydrochlorothiazide (PRINZIDE,ZESTORETIC) 20-25 MG tablet Take 1 tablet by mouth daily.    [provider]  lovastatin (MEVACOR) 40 MG tablet Take 40 mg by mouth daily.    [provider]  meloxicam (MOBIC) 15 MG tablet Take 15 mg by mouth daily.    [provider]    Physical Exam: Vitals:   09/07/17 2200 09/07/17 2215 09/07/17 2230 09/07/17 2258  BP: (!) 148/86 (!) 149/85 127/81 (!) 147/87  Pulse: Marland Kitchen)  101 88 91 81  Resp: 20 15 16 18   Temp:    98.5 F (36.9 C)  TempSrc:    Oral  SpO2: 99% 100% 97% 100%    Constitutional: Patient lying in bed, seems comfortable, very hard of hearing, answering yes to most questions.  Per family, at baseline.  Vitals:   09/07/17 2200 09/07/17 2215 09/07/17 2230 09/07/17 2258  BP: (!) 148/86 (!) 149/85 127/81 (!) 147/87  Pulse: (!) 101 88 91 81  Resp: 20 15 16 18   Temp:    98.5 F (36.9 C)  TempSrc:    Oral  SpO2: 99% 100% 97% 100%   Eyes: She has ecchymosis around right eye with swelling.  Eye moves with manual opening of eyelid, possible restriction of lateral movement, but patient was not obeying many commands.  Mild ecchymosis under left eye.  ENMT: Mucous membranes are moist. Posterior pharynx clear of any exudate or  lesions.  She has a patch of leukoplakia on the left lateral tongue, does not scrape off.   Neck: normal, supple, no masses, no thyromegaly Respiratory: clear to auscultation bilaterally. Normal respiratory effort. No accessory muscle use.  Cardiovascular: Regular rate and rhythm, no murmurs / rubs / gallops. No extremity edema. 2+ pedal pulses.  Abdomen: mild tenderness over the suprapubic area. Bowel sounds positive.  Musculoskeletal: no clubbing / cyanosis. Normal muscle tone.  Skin: no rashes, lesions, ulcers.  Neurologic: Grossly intact, moving all extremities.   Psychiatric: difficult to assess, she answers most questions yes, knew she was in hospital and who her sister was.  Remembered some aspects of falling.    Labs on Admission: I have personally reviewed following labs and imaging studies  CBC: Recent Labs  Lab 09/07/17 1720  WBC 7.5  NEUTROABS 6.1  HGB 10.8*  HCT 30.4*  MCV 76.2*  PLT 298   Basic Metabolic Panel: Recent Labs  Lab 09/07/17 1720 09/07/17 2203  NA 116* 116*  K 3.2* 3.2*  CL 80* 85*  CO2 23 20*  GLUCOSE 105* 103*  BUN 21* 19  CREATININE 1.18* 1.04*  CALCIUM 8.9 8.4*   GFR: CrCl cannot be calculated (Unknown ideal weight.). Liver Function Tests: No results for input(s): AST, ALT, ALKPHOS, BILITOT, PROT, ALBUMIN in the last 168 hours. No results for input(s): LIPASE, AMYLASE in the last 168 hours. No results for input(s): AMMONIA in the last 168 hours. Coagulation Profile: No results for input(s): INR, PROTIME in the last 168 hours. Cardiac Enzymes: Recent Labs  Lab 09/07/17 1807  CKTOTAL 437*   BNP (last 3 results) No results for input(s): PROBNP in the last 8760 hours. HbA1C: No results for input(s): HGBA1C in the last 72 hours. CBG: No results for input(s): GLUCAP in the last 168 hours. Lipid Profile: No results for input(s): CHOL, HDL, LDLCALC, TRIG, CHOLHDL, LDLDIRECT in the last 72 hours. Thyroid Function Tests: No results for  input(s): TSH, T4TOTAL, FREET4, T3FREE, THYROIDAB in the last 72 hours. Anemia Panel: No results for input(s): VITAMINB12, FOLATE, FERRITIN, TIBC, IRON, RETICCTPCT in the last 72 hours. Urine analysis:    Component Value Date/Time   COLORURINE YELLOW 09/07/2017 1741   APPEARANCEUR CLEAR 09/07/2017 1741   LABSPEC 1.010 09/07/2017 1741   PHURINE 5.0 09/07/2017 1741   GLUCOSEU NEGATIVE 09/07/2017 1741   HGBUR NEGATIVE 09/07/2017 1741   BILIRUBINUR NEGATIVE 09/07/2017 1741   KETONESUR 5 (A) 09/07/2017 1741   PROTEINUR NEGATIVE 09/07/2017 1741   NITRITE NEGATIVE 09/07/2017 1741   LEUKOCYTESUR TRACE (A)  09/07/2017 1741    Radiological Exams on Admission: Dg Chest 1 View  Result Date: 09/07/2017 CLINICAL DATA:  66 year old female status post fall last night. EXAM: CHEST 1 VIEW COMPARISON:  None. FINDINGS: The heart size and mediastinal contours are within normal limits. Both lungs are clear. The visualized skeletal structures are unremarkable. IMPRESSION: No active disease. Electronically Signed   By: Sande Brothers M.D.   On: 09/07/2017 16:42   Ct Head Wo Contrast  Result Date: 09/07/2017 CLINICAL DATA:  Patient fell last evening with bruising and swelling of the right eye. Laceration of the eyebrow. EXAM: CT HEAD WITHOUT CONTRAST CT MAXILLOFACIAL WITHOUT CONTRAST CT CERVICAL SPINE WITHOUT CONTRAST TECHNIQUE: Multidetector CT imaging of the head, cervical spine, and maxillofacial structures were performed using the standard protocol without intravenous contrast. Multiplanar CT image reconstructions of the cervical spine and maxillofacial structures were also generated. COMPARISON:  None. FINDINGS: CT HEAD FINDINGS Brain: Mild sulcal and moderate ventricular prominence consistent with superficial and central atrophy. Chronic appearing mild to moderate small vessel ischemic disease of periventricular white matter. No acute intracranial hemorrhage, midline shift or edema. No large vascular  territory infarct. No extra-axial collections. Vascular: No hyperdense vessels. Moderate atherosclerosis of the cavernous internal carotids and both vertebral arteries. Skull: Intact zygomatic arches.  Intact bony calvarium. Other: None CT MAXILLOFACIAL FINDINGS Osseous: Acute right orbital floor fracture with fracture also involving the canal for the right maxillary nerve. 3 mm of caudal displacement of the fracture fragment is identified. Herniation of orbital fat through the fracture is identified with slight herniation of the nerve as well. Associated medial wall fracture of the orbit is also noted. Intact zygomatic arches. Intact pterygoid plates. The temporomandibular joints are maintained. No mandibular fracture. Nasal bones are intact. Anterior maxillary process is intact. Orbits: Medial right orbital extraconal hemorrhage/edema measuring 2 mm in thickness, series 13, image 31. Small focus of intraconal hemorrhage also noted, series 13, image 34 with retrobulbar or mild edema. The globes appear intact. Sinuses: Hyperdense blood products and herniated retrobulbar fat is noted within the right maxillary sinus. Soft tissues: Right periorbital soft tissue swelling is noted. CT CERVICAL SPINE FINDINGS Alignment: Slight straightening of cervical lordosis. Minimal grade 1 anterolisthesis of C4 on C5. Intact craniocervical relationship. Osteoarthritis of the atlantodental interval. Skull base and vertebrae: No acute fracture. No primary bone lesion or focal pathologic process. Soft tissues and spinal canal: No prevertebral fluid or swelling. No visible canal hematoma. Disc levels: Mild disc space narrowing C4-5, C5-6 and C6-7. No focal disc herniations or significant canal stenosis. Facet arthropathy on the left with posterior marginal osteophytes at C4-5 contribute to left-sided neural foraminal encroachment. Upper chest: No acute abnormality Other: None IMPRESSION: 1. Acute right orbital floor and medial orbital  wall fractures with fracture involving the canal for the right maxillary nerve seen along the medial orbital floor. There is 3 mm of caudal displacement of the orbital floor fracture with herniation of periorbital fat and minimal herniation of the right maxillary nerve. 2. Associated with the right orbital fractures are a small amount of medial extraconal hemorrhage/fluid as well as small amount of lateral intraconal hemorrhage. Hyperdense fluid in the right maxillary sinus would be in keeping with hemorrhage/blood products. Right periorbital soft tissue swelling is noted. 3. No acute intracranial abnormality. 4. Mild cervical spondylosis without acute cervical spine fracture. Electronically Signed   By: Tollie Eth M.D.   On: 09/07/2017 16:46   Ct Cervical Spine Wo Contrast  Result Date: 09/07/2017  CLINICAL DATA:  Patient fell last evening with bruising and swelling of the right eye. Laceration of the eyebrow. EXAM: CT HEAD WITHOUT CONTRAST CT MAXILLOFACIAL WITHOUT CONTRAST CT CERVICAL SPINE WITHOUT CONTRAST TECHNIQUE: Multidetector CT imaging of the head, cervical spine, and maxillofacial structures were performed using the standard protocol without intravenous contrast. Multiplanar CT image reconstructions of the cervical spine and maxillofacial structures were also generated. COMPARISON:  None. FINDINGS: CT HEAD FINDINGS Brain: Mild sulcal and moderate ventricular prominence consistent with superficial and central atrophy. Chronic appearing mild to moderate small vessel ischemic disease of periventricular white matter. No acute intracranial hemorrhage, midline shift or edema. No large vascular territory infarct. No extra-axial collections. Vascular: No hyperdense vessels. Moderate atherosclerosis of the cavernous internal carotids and both vertebral arteries. Skull: Intact zygomatic arches.  Intact bony calvarium. Other: None CT MAXILLOFACIAL FINDINGS Osseous: Acute right orbital floor fracture with  fracture also involving the canal for the right maxillary nerve. 3 mm of caudal displacement of the fracture fragment is identified. Herniation of orbital fat through the fracture is identified with slight herniation of the nerve as well. Associated medial wall fracture of the orbit is also noted. Intact zygomatic arches. Intact pterygoid plates. The temporomandibular joints are maintained. No mandibular fracture. Nasal bones are intact. Anterior maxillary process is intact. Orbits: Medial right orbital extraconal hemorrhage/edema measuring 2 mm in thickness, series 13, image 31. Small focus of intraconal hemorrhage also noted, series 13, image 34 with retrobulbar or mild edema. The globes appear intact. Sinuses: Hyperdense blood products and herniated retrobulbar fat is noted within the right maxillary sinus. Soft tissues: Right periorbital soft tissue swelling is noted. CT CERVICAL SPINE FINDINGS Alignment: Slight straightening of cervical lordosis. Minimal grade 1 anterolisthesis of C4 on C5. Intact craniocervical relationship. Osteoarthritis of the atlantodental interval. Skull base and vertebrae: No acute fracture. No primary bone lesion or focal pathologic process. Soft tissues and spinal canal: No prevertebral fluid or swelling. No visible canal hematoma. Disc levels: Mild disc space narrowing C4-5, C5-6 and C6-7. No focal disc herniations or significant canal stenosis. Facet arthropathy on the left with posterior marginal osteophytes at C4-5 contribute to left-sided neural foraminal encroachment. Upper chest: No acute abnormality Other: None IMPRESSION: 1. Acute right orbital floor and medial orbital wall fractures with fracture involving the canal for the right maxillary nerve seen along the medial orbital floor. There is 3 mm of caudal displacement of the orbital floor fracture with herniation of periorbital fat and minimal herniation of the right maxillary nerve. 2. Associated with the right orbital  fractures are a small amount of medial extraconal hemorrhage/fluid as well as small amount of lateral intraconal hemorrhage. Hyperdense fluid in the right maxillary sinus would be in keeping with hemorrhage/blood products. Right periorbital soft tissue swelling is noted. 3. No acute intracranial abnormality. 4. Mild cervical spondylosis without acute cervical spine fracture. Electronically Signed   By: Tollie Ethavid  Kwon M.D.   On: 09/07/2017 16:46   Ct Pelvis Wo Contrast  Result Date: 09/07/2017 CLINICAL DATA:  Bilateral hip pain after fall yesterday. EXAM: CT PELVIS WITHOUT CONTRAST TECHNIQUE: Multidetector CT imaging of the pelvis was performed following the standard protocol without intravenous contrast. COMPARISON:  Radiograph earlier this day.  Right hip CT 02/20/2017 FINDINGS: Urinary Tract: Bladder physiologically distended. Distal ureters are decompressed. Bowel: No acute abnormality. Distal colonic diverticulosis. No bowel inflammation. Vascular/Lymphatic: Aorto bi-iliac atherosclerosis. No distal aneurysm. Small bilateral external iliac nodes. Reproductive: Unchanged 2.1 cm cyst in the right ovary. Uterine calcification  likely fibroid. Left ovary is quiescent. Other:  No pelvic free fluid. Musculoskeletal: No acute fracture. Advanced arthropathy of bilateral hips with femoral head flattening and collapse, subchondral cystic change and scleroses, and bony fragmentation. Bilateral hip joint effusions. Findings in the right hip have progressed from prior CT. Pubic rami are intact. Sacroiliac joints and pubic symphysis are congruent. IMPRESSION: 1. No acute fracture of the pelvis or hips. 2. Bilateral hip AVN and advanced osteoarthritis, findings have progressed in the right hip from CT 6 months prior. Electronically Signed   By: Rubye Oaks M.D.   On: 09/07/2017 19:56   Dg Hip Unilat W Or Wo Pelvis 2-3 Views Right  Result Date: 09/07/2017 CLINICAL DATA:  66 year old female with history of trauma from  a fall yesterday evening. Right-sided hip pain. EXAM: DG HIP (WITH OR WITHOUT PELVIS) 2-3V RIGHT COMPARISON:  02/20/2017. 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 Flattening of the left femoral head has significantly progressed compared to the prior study. There is also extensive joint space narrowing, subchondral sclerosis, subchondral cyst formation and osteophyte formation in the hip joints bilaterally compatible with advanced bilateral osteoarthritis. Chronic superior and lateral subluxation of the right femoral head is similar to the prior study. No acute displaced fracture. IMPRESSION: 1. No acute abnormality of the bony pelvis or the right hip. 2. 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 Electronically Signed   By: Trudie Reed M.D.   On: 09/07/2017 16:52   Ct Maxillofacial Wo Contrast  Result Date: 09/07/2017 CLINICAL DATA:  Patient fell last evening with bruising and swelling of the right eye. Laceration of the eyebrow. EXAM: CT HEAD WITHOUT CONTRAST CT MAXILLOFACIAL WITHOUT CONTRAST CT CERVICAL SPINE WITHOUT CONTRAST TECHNIQUE: Multidetector CT imaging of the head, cervical spine, and maxillofacial structures were performed using the standard protocol without intravenous contrast. Multiplanar CT image reconstructions of the cervical spine and maxillofacial structures were also generated. COMPARISON:  None. FINDINGS: CT HEAD FINDINGS Brain: Mild sulcal and moderate ventricular prominence consistent with superficial and central atrophy. Chronic appearing mild to moderate small vessel ischemic disease of periventricular white matter.  No acute intracranial hemorrhage, midline shift or edema. No large vascular territory infarct. No extra-axial collections. Vascular: No hyperdense vessels. Moderate atherosclerosis of the cavernous internal carotids and both vertebral arteries. Skull: Intact zygomatic arches.  Intact bony calvarium. Other: None CT MAXILLOFACIAL FINDINGS Osseous: Acute right orbital floor fracture with fracture also involving the canal for the right maxillary nerve. 3 mm of caudal displacement of the fracture fragment is identified. Herniation of orbital fat through the fracture is identified with slight herniation of the nerve as well. Associated medial wall fracture of the orbit is also noted. Intact zygomatic arches. Intact pterygoid plates. The temporomandibular joints are maintained. No mandibular fracture. Nasal bones are intact. Anterior maxillary process is intact. Orbits: Medial right orbital extraconal hemorrhage/edema measuring 2 mm in thickness, series 13, image 31. Small focus of intraconal hemorrhage also noted, series 13, image 34 with retrobulbar or mild edema. The globes appear intact. Sinuses: Hyperdense blood products and herniated retrobulbar fat is noted within the right maxillary sinus. Soft tissues: Right periorbital soft tissue swelling is noted. CT CERVICAL SPINE FINDINGS Alignment:  Slight straightening of cervical lordosis. Minimal grade 1 anterolisthesis of C4 on C5. Intact craniocervical relationship. Osteoarthritis of the atlantodental interval. Skull base and vertebrae: No acute fracture. No primary bone lesion or focal pathologic process. Soft tissues and spinal canal: No prevertebral fluid or swelling. No visible canal hematoma. Disc levels: Mild disc space narrowing C4-5, C5-6 and C6-7. No focal disc herniations or significant canal stenosis. Facet arthropathy on the left with posterior marginal osteophytes at C4-5 contribute to left-sided neural foraminal encroachment. Upper chest: No acute  abnormality Other: None IMPRESSION: 1. Acute right orbital floor and medial orbital wall fractures with fracture involving the canal for the right maxillary nerve seen along the medial orbital floor. There is 3 mm of caudal displacement of the orbital floor fracture with herniation of periorbital fat and minimal herniation of the right maxillary nerve. 2. Associated with the right orbital fractures are a small amount of medial extraconal hemorrhage/fluid as well as small amount of lateral intraconal hemorrhage. Hyperdense fluid in the right maxillary sinus would be in keeping with hemorrhage/blood products. Right periorbital soft tissue swelling is noted. 3. No acute intracranial abnormality. 4. Mild cervical spondylosis without acute cervical spine fracture. Electronically Signed   By: Tollie Ethavid  Kwon M.D.   On: 09/07/2017 16:46      Assessment/Plan  Severe Hyponatremia - Na 116, confirmed on repeat BMET.  Urine Na 14, urnine - Received 500cc of NS in the ED and possibly 100cc of LR which was discontinued.   - Urine Na and Osm sent, likely causes included alcoholism vs. Low solute diet.  Could also be SIADH or other cause.  Urine Na 14 - Given severe nature, treatment could be hypertonic saline.  I called and discussed patient at length with critical care team.  She has a somewhat difficult to assess mental status, but is at baseline per family.  She has a poor diet, is noted to be an alcoholic per family and also is on a thiazide diuretic.  It is likely safe to improve her sodium slowly with DDAVP and normal saline with q 4 hour bmets in the SDU (5W progressive unit).  Plan will be NS at 50cc/hr, DDAVP 1mcg q 8 hours and q 4 hour NA evaluations.  Will also consider free water restriction.  If Na starts correcting too rapidly will stop DDAVP.  If patient does not improve or mental status worsens overnight, call critical care team back (discussed with Dr. Delton CoombesByrum) - Neuro checks q 4 hours.   Elevated CK -  Mildly elevated after fall at 437 - Unable to give high rate of fluids at this time given Na - Trend, will check in the AM  Hypokalemia - Replete with oral K - 40 meq    Hypertension - Hold lisinopril-hctz and monitor blood pressure - Hydralazine PRN for SBP > 180    Hyperlipidemia - Hold statin until improved  ETOH use - CIWA protocol  - Folate, thiamine and MVI  Microcytic anemia - Iron studies, possibly from malnutrition  Orbital fracture - [facility] - Dr. Kenney Housemanrab, consulted by ED and will see patient in the AM  Leukoplakia of tongue  - Consider review by OMFS - may need evaluation as an outpatient.    DVT prophylaxis: Heparin SQ Code Status: Full Family Communication: Sister at bedside Disposition Plan: Admit to SDU Consults called: Discussed case with Dr. Delton CoombesByrum in CCM Admission status: SDU, telemetry   Debe CoderMULLEN, Toniann Dickerson MD [facility] Pager [phone]  If 7PM-7AM, please  contact night-coverage [email or web address] Password [facility]  09/07/2017, 11:24 PM

## 2017-09-07 NOTE — ED Triage Notes (Signed)
Pt arrived from home after falling last night. Pt has bruising and swelling to the right eye. Laceration also noted to eyebrow but the wound is no longer bleeding. Pt states she tripped and fell. Pt is alert; oriented to place and name only.

## 2017-09-08 ENCOUNTER — Encounter (HOSPITAL_COMMUNITY): Payer: Self-pay

## 2017-09-08 ENCOUNTER — Other Ambulatory Visit: Payer: Self-pay

## 2017-09-08 DIAGNOSIS — F1020 Alcohol dependence, uncomplicated: Secondary | ICD-10-CM

## 2017-09-08 DIAGNOSIS — K148 Other diseases of tongue: Secondary | ICD-10-CM

## 2017-09-08 DIAGNOSIS — M87051 Idiopathic aseptic necrosis of right femur: Secondary | ICD-10-CM

## 2017-09-08 DIAGNOSIS — S0285XA Fracture of orbit, unspecified, initial encounter for closed fracture: Secondary | ICD-10-CM | POA: Insufficient documentation

## 2017-09-08 DIAGNOSIS — F819 Developmental disorder of scholastic skills, unspecified: Secondary | ICD-10-CM

## 2017-09-08 DIAGNOSIS — M87052 Idiopathic aseptic necrosis of left femur: Secondary | ICD-10-CM

## 2017-09-08 HISTORY — DX: Alcohol dependence, uncomplicated: F10.20

## 2017-09-08 HISTORY — DX: Idiopathic aseptic necrosis of right femur: M87.051

## 2017-09-08 HISTORY — DX: Developmental disorder of scholastic skills, unspecified: F81.9

## 2017-09-08 HISTORY — DX: Fracture of orbit, unspecified, initial encounter for closed fracture: S02.85XA

## 2017-09-08 HISTORY — DX: Other diseases of tongue: K14.8

## 2017-09-08 LAB — BASIC METABOLIC PANEL
ANION GAP: 11 (ref 5–15)
ANION GAP: 10 (ref 5–15)
ANION GAP: 9 (ref 5–15)
ANION GAP: 8 (ref 5–15)
ANION GAP: 7 (ref 5–15)
BUN: 19 mg/dL (ref 6–20)
BUN: 17 mg/dL (ref 6–20)
BUN: 16 mg/dL (ref 6–20)
BUN: 16 mg/dL (ref 6–20)
BUN: 13 mg/dL (ref 6–20)
CALCIUM: 8.2 mg/dL — AB (ref 8.9–10.3)
CALCIUM: 8 mg/dL — AB (ref 8.9–10.3)
CHLORIDE: 87 mmol/L — AB (ref 101–111)
CHLORIDE: 87 mmol/L — AB (ref 101–111)
CHLORIDE: 85 mmol/L — AB (ref 101–111)
CO2: 22 mmol/L (ref 22–32)
CO2: 21 mmol/L — AB (ref 22–32)
CO2: 22 mmol/L (ref 22–32)
CO2: 20 mmol/L — ABNORMAL LOW (ref 22–32)
CO2: 20 mmol/L — ABNORMAL LOW (ref 22–32)
Calcium: 8.4 mg/dL — ABNORMAL LOW (ref 8.9–10.3)
Calcium: 7.8 mg/dL — ABNORMAL LOW (ref 8.9–10.3)
Calcium: 7.7 mg/dL — ABNORMAL LOW (ref 8.9–10.3)
Chloride: 84 mmol/L — ABNORMAL LOW (ref 101–111)
Chloride: 88 mmol/L — ABNORMAL LOW (ref 101–111)
Creatinine, Ser: 1.06 mg/dL — ABNORMAL HIGH (ref 0.44–1.00)
Creatinine, Ser: 0.99 mg/dL (ref 0.44–1.00)
Creatinine, Ser: 0.96 mg/dL (ref 0.44–1.00)
Creatinine, Ser: 0.96 mg/dL (ref 0.44–1.00)
Creatinine, Ser: 0.99 mg/dL (ref 0.44–1.00)
GFR calc Af Amer: >60 mL/min (ref >60)
GFR calc Af Amer: >60 mL/min (ref >60)
GFR calc Af Amer: >60 mL/min (ref >60)
GFR calc non Af Amer: 57 mL/min — ABNORMAL LOW (ref >60)
GFR calc non Af Amer: 59 mL/min — ABNORMAL LOW (ref >60)
GFR calc non Af Amer: 59 mL/min — ABNORMAL LOW (ref >60)
GFR, EST NON AFRICAN AMERICAN: 53 mL/min — AB (ref >60)
GFR, EST NON AFRICAN AMERICAN: 57 mL/min — AB (ref >60)
GLUCOSE: 91 mg/dL (ref 65–99)
GLUCOSE: 89 mg/dL (ref 65–99)
GLUCOSE: 89 mg/dL (ref 65–99)
Glucose, Bld: 92 mg/dL (ref 65–99)
Glucose, Bld: 92 mg/dL (ref 65–99)
POTASSIUM: 3.4 mmol/L — AB (ref 3.5–5.1)
POTASSIUM: 4.3 mmol/L (ref 3.5–5.1)
POTASSIUM: 4.8 mmol/L (ref 3.5–5.1)
Potassium: 2.9 mmol/L — ABNORMAL LOW (ref 3.5–5.1)
Potassium: 3.4 mmol/L — ABNORMAL LOW (ref 3.5–5.1)
SODIUM: 115 mmol/L — AB (ref 135–145)
SODIUM: 112 mmol/L — AB (ref 135–145)
Sodium: 117 mmol/L — CL (ref 135–145)
Sodium: 119 mmol/L — CL (ref 135–145)
Sodium: 118 mmol/L — CL (ref 135–145)

## 2017-09-08 LAB — GLUCOSE, CAPILLARY: Glucose-Capillary: 98 mg/dL (ref 65–99)

## 2017-09-08 LAB — TSH: TSH: 1.378 u[IU]/mL (ref 0.350–4.500)

## 2017-09-08 LAB — CREATININE, SERUM
Creatinine, Ser: 1.08 mg/dL — ABNORMAL HIGH (ref 0.44–1.00)
GFR calc Af Amer: 60 mL/min — ABNORMAL LOW (ref >60)
GFR calc non Af Amer: 52 mL/min — ABNORMAL LOW (ref >60)

## 2017-09-08 LAB — MAGNESIUM: Magnesium: 1.2 mg/dL — ABNORMAL LOW (ref 1.7–2.4)

## 2017-09-08 MED ORDER — LORAZEPAM 2 MG/ML IJ SOLN: 2.0000 mg | INTRAMUSCULAR | Status: DC | PRN

## 2017-09-08 MED ORDER — MAGNESIUM SULFATE 2 GM/50ML IV SOLN
2.0000 g | Freq: Once | INTRAVENOUS | Status: AC
  Administered 2017-09-08: 2 g via INTRAVENOUS
  Filled 2017-09-08: qty 50

## 2017-09-08 MED ORDER — POTASSIUM CHLORIDE CRYS ER 20 MEQ PO TBCR
40.0000 meq | EXTENDED_RELEASE_TABLET | ORAL | Status: AC
  Administered 2017-09-08 (×3): 40 meq via ORAL
  Filled 2017-09-08 (×4): qty 2

## 2017-09-08 MED ORDER — HYDRALAZINE HCL 20 MG/ML IJ SOLN
5.0000 mg | Freq: Three times a day (TID) | INTRAMUSCULAR | Status: DC | PRN
  Administered 2017-09-10: 5 mg via INTRAVENOUS
  Filled 2017-09-08: qty 1

## 2017-09-08 MED ORDER — SODIUM CHLORIDE 0.9 % IV SOLN
INTRAVENOUS | Status: DC
  Administered 2017-09-08: 22:00:00 via INTRAVENOUS

## 2017-09-08 NOTE — Progress Notes (Signed)
CRITICAL VALUE ALERT  Critical Value:  Sodium: 119  Date & Time Notied: 09/08/17 0837  Provider Notified: Dr. Irene LimboGoodrich   Orders Received/Actions taken: Awaiting orders.
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
## 2017-09-08 NOTE — Progress Notes (Signed)
CRITICAL VALUE ALERT  Critical Value:  [id number]  Date & Time Notied:  09/08/17 & 2:40 am  Provider Notified: T. Opyd  Orders Received/Actions taken: New orders received; continue to monitor

## 2017-09-08 NOTE — Progress Notes (Signed)
Patient received from ED. Alert and oriented x2. Vital signs are stable.skin assessment done with another nurse. Patient given instruction about call bell and phone.bed in low position and side rail up x2. Call bell in reach.
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
## 2017-09-08 NOTE — Progress Notes (Addendum)
CRITICAL VALUE ALERT  Critical Value:  Sodium 118  Date & Time Notied:  11:45 09/08/2017  Provider Notified: Dr. Irene LimboGoodrich  Orders Received/Actions taken: Will continue to monitor

## 2017-09-08 NOTE — Plan of Care (Signed)
Patient progressing 

## 2017-09-08 NOTE — Progress Notes (Signed)
CRITICAL VALUE ALERT  Critical Value:  Sodium 115  Date & Time Notied:  1735 09/08/2017  Provider Notified: Dr. Irene LimboGoodrich  Orders Received/Actions taken: Awaiting orders

## 2017-09-08 NOTE — Plan of Care (Signed)
  Health Behavior/Discharge Planning: Ability to manage health-related needs will improve 09/08/2017 2243 - Progressing by Ruel FavorsBrown-Staples, Lamberto Dinapoli, RN   Pain Managment: General experience of comfort will improve 09/08/2017 2243 - Progressing by Ruel FavorsBrown-Staples, Larinda Herter, RN   Safety: Ability to remain free from injury will improve 09/08/2017 2243 - Progressing by Ruel FavorsBrown-Staples, Annaelle Kasel, RN   Skin Integrity: Risk for impaired skin integrity will decrease 09/08/2017 2243 - Progressing by Ruel FavorsBrown-Staples, Jerlisa Diliberto, RN
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
## 2017-09-08 NOTE — Progress Notes (Signed)
Patient transfer to [facility] [address] as per MD order. Report given to RN taking care of patient.

## 2017-09-09 ENCOUNTER — Other Ambulatory Visit: Payer: Self-pay

## 2017-09-09 LAB — BASIC METABOLIC PANEL
ANION GAP: 6 (ref 5–15)
ANION GAP: 6 (ref 5–15)
ANION GAP: 7 (ref 5–15)
BUN: 9 mg/dL (ref 6–20)
BUN: 7 mg/dL (ref 6–20)
BUN: 7 mg/dL (ref 6–20)
CALCIUM: 8 mg/dL — AB (ref 8.9–10.3)
CALCIUM: 8.2 mg/dL — AB (ref 8.9–10.3)
CALCIUM: 7.7 mg/dL — AB (ref 8.9–10.3)
CHLORIDE: 87 mmol/L — AB (ref 101–111)
CHLORIDE: 85 mmol/L — AB (ref 101–111)
CO2: 22 mmol/L (ref 22–32)
CO2: 23 mmol/L (ref 22–32)
CO2: 22 mmol/L (ref 22–32)
Chloride: 85 mmol/L — ABNORMAL LOW (ref 101–111)
Creatinine, Ser: 0.82 mg/dL (ref 0.44–1.00)
Creatinine, Ser: 0.76 mg/dL (ref 0.44–1.00)
Creatinine, Ser: 0.73 mg/dL (ref 0.44–1.00)
GFR calc non Af Amer: >60 mL/min (ref >60)
GFR calc non Af Amer: >60 mL/min (ref >60)
GFR calc non Af Amer: >60 mL/min (ref >60)
GLUCOSE: 98 mg/dL (ref 65–99)
GLUCOSE: 95 mg/dL (ref 65–99)
Glucose, Bld: 96 mg/dL (ref 65–99)
POTASSIUM: 4.1 mmol/L (ref 3.5–5.1)
POTASSIUM: 4.3 mmol/L (ref 3.5–5.1)
POTASSIUM: 3.7 mmol/L (ref 3.5–5.1)
Sodium: 115 mmol/L — CL (ref 135–145)
Sodium: 114 mmol/L — CL (ref 135–145)
Sodium: 114 mmol/L — CL (ref 135–145)

## 2017-09-09 LAB — SODIUM, URINE, RANDOM: SODIUM UR: 104 mmol/L

## 2017-09-09 LAB — GLUCOSE, CAPILLARY: Glucose-Capillary: 80 mg/dL (ref 65–99)

## 2017-09-09 LAB — MAGNESIUM
MAGNESIUM: 1.5 mg/dL — AB (ref 1.7–2.4)
Magnesium: 2.2 mg/dL (ref 1.7–2.4)

## 2017-09-09 LAB — PHOSPHORUS: PHOSPHORUS: 1.9 mg/dL — AB (ref 2.5–4.6)

## 2017-09-09 LAB — CORTISOL: Cortisol, Plasma: 15.1 ug/dL

## 2017-09-09 LAB — OSMOLALITY: OSMOLALITY: 237 mosm/kg — AB (ref 275–295)

## 2017-09-09 LAB — OSMOLALITY, URINE: OSMOLALITY UR: 287 mosm/kg — AB (ref 300–900)

## 2017-09-09 LAB — URIC ACID: URIC ACID, SERUM: 4.2 mg/dL (ref 2.3–6.6)

## 2017-09-09 MED ORDER — FLUORESCEIN-BENOXINATE 0.25-0.4 % OP SOLN
2.0000 [drp] | Freq: Once | OPHTHALMIC | Status: AC
  Administered 2017-09-09: 2 [drp] via OPHTHALMIC
  Filled 2017-09-09 (×2): qty 5

## 2017-09-09 MED ORDER — DESMOPRESSIN ACETATE 4 MCG/ML IJ SOLN
1.0000 ug | Freq: Three times a day (TID) | INTRAMUSCULAR | Status: DC
  Filled 2017-09-09: qty 1

## 2017-09-09 MED ORDER — MAGNESIUM SULFATE 4 GM/100ML IV SOLN
4.0000 g | Freq: Once | INTRAVENOUS | Status: AC
  Administered 2017-09-09: 4 g via INTRAVENOUS
  Filled 2017-09-09: qty 100

## 2017-09-09 MED ORDER — SODIUM CHLORIDE 1 G PO TABS
2.0000 g | ORAL_TABLET | Freq: Three times a day (TID) | ORAL | Status: DC
  Administered 2017-09-09 – 2017-09-11 (×8): 2 g via ORAL
  Filled 2017-09-09 (×9): qty 2

## 2017-09-09 MED ORDER — DESMOPRESSIN ACETATE 4 MCG/ML IJ SOLN
1.0000 ug | Freq: Three times a day (TID) | INTRAMUSCULAR | Status: DC
  Administered 2017-09-10: 1 ug via SUBCUTANEOUS
  Filled 2017-09-09 (×2): qty 1

## 2017-09-09 MED ORDER — K PHOS MONO-SOD PHOS DI & MONO 155-852-130 MG PO TABS
250.0000 mg | ORAL_TABLET | Freq: Three times a day (TID) | ORAL | Status: DC
  Administered 2017-09-09 – 2017-09-10 (×6): 250 mg via ORAL
  Filled 2017-09-09 (×7): qty 1

## 2017-09-09 NOTE — Progress Notes (Addendum)
CRITICAL VALUE ALERT  Critical Value:  Serum osmo 237  Date & Time Notied:  09/09/17, 1205  Provider Notified: Leodis BinetP. Patel, MD  Orders Received/Actions taken: new orders received
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
## 2017-09-09 NOTE — Progress Notes (Addendum)
CRITICAL VALUE ALERT  Critical Value:  Sodium 114  Date & Time Notied:  09/09/17, 1205  Provider Notified: Leodis BinetP. Patel, MD  Orders Received/Actions taken: new orders received
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
## 2017-09-10 LAB — BASIC METABOLIC PANEL
ANION GAP: 6 (ref 5–15)
ANION GAP: 7 (ref 5–15)
ANION GAP: 6 (ref 5–15)
Anion gap: 9 (ref 5–15)
Anion gap: 6 (ref 5–15)
BUN: 5 mg/dL — ABNORMAL LOW (ref 6–20)
BUN: <5 mg/dL — ABNORMAL LOW (ref 6–20)
CALCIUM: 7.9 mg/dL — AB (ref 8.9–10.3)
CALCIUM: 8 mg/dL — AB (ref 8.9–10.3)
CALCIUM: 7.6 mg/dL — AB (ref 8.9–10.3)
CALCIUM: 7.8 mg/dL — AB (ref 8.9–10.3)
CO2: 21 mmol/L — ABNORMAL LOW (ref 22–32)
CO2: 22 mmol/L (ref 22–32)
CO2: 23 mmol/L (ref 22–32)
CO2: 23 mmol/L (ref 22–32)
CO2: 22 mmol/L (ref 22–32)
CREATININE: 0.69 mg/dL (ref 0.44–1.00)
Calcium: 7.8 mg/dL — ABNORMAL LOW (ref 8.9–10.3)
Chloride: 84 mmol/L — ABNORMAL LOW (ref 101–111)
Chloride: 84 mmol/L — ABNORMAL LOW (ref 101–111)
Chloride: 86 mmol/L — ABNORMAL LOW (ref 101–111)
Chloride: 86 mmol/L — ABNORMAL LOW (ref 101–111)
Chloride: 86 mmol/L — ABNORMAL LOW (ref 101–111)
Creatinine, Ser: 0.76 mg/dL (ref 0.44–1.00)
Creatinine, Ser: 0.68 mg/dL (ref 0.44–1.00)
Creatinine, Ser: 0.67 mg/dL (ref 0.44–1.00)
Creatinine, Ser: 0.72 mg/dL (ref 0.44–1.00)
GFR calc Af Amer: >60 mL/min (ref >60)
GFR calc Af Amer: >60 mL/min (ref >60)
GFR calc non Af Amer: >60 mL/min (ref >60)
GLUCOSE: 91 mg/dL (ref 65–99)
Glucose, Bld: 121 mg/dL — ABNORMAL HIGH (ref 65–99)
Glucose, Bld: 91 mg/dL (ref 65–99)
Glucose, Bld: 103 mg/dL — ABNORMAL HIGH (ref 65–99)
Glucose, Bld: 99 mg/dL (ref 65–99)
POTASSIUM: 3.3 mmol/L — AB (ref 3.5–5.1)
POTASSIUM: 3.4 mmol/L — AB (ref 3.5–5.1)
POTASSIUM: 3.5 mmol/L (ref 3.5–5.1)
POTASSIUM: 3.5 mmol/L (ref 3.5–5.1)
Potassium: 3.4 mmol/L — ABNORMAL LOW (ref 3.5–5.1)
SODIUM: 115 mmol/L — AB (ref 135–145)
SODIUM: 116 mmol/L — AB (ref 135–145)
SODIUM: 114 mmol/L — AB (ref 135–145)
Sodium: 114 mmol/L — CL (ref 135–145)
Sodium: 112 mmol/L — CL (ref 135–145)

## 2017-09-10 LAB — CBC
HEMATOCRIT: 26.6 % — AB (ref 36.0–46.0)
Hemoglobin: 9.6 g/dL — ABNORMAL LOW (ref 12.0–15.0)
MCH: 27.3 pg (ref 26.0–34.0)
MCHC: 36.1 g/dL — AB (ref 30.0–36.0)
MCV: 75.6 fL — ABNORMAL LOW (ref 78.0–100.0)
Platelets: 260 10*3/uL (ref 150–400)
RBC: 3.52 MIL/uL — ABNORMAL LOW (ref 3.87–5.11)
RDW: 11.7 % (ref 11.5–15.5)
WBC: 7.7 10*3/uL (ref 4.0–10.5)

## 2017-09-10 MED ORDER — SODIUM CHLORIDE 0.9 % IV SOLN
INTRAVENOUS | Status: DC
  Administered 2017-09-10 – 2017-09-11 (×2): via INTRAVENOUS

## 2017-09-10 NOTE — Consult Note (Signed)
Reason for Consult: Orbital fractures Referring Physician: ED  Norma Ayers is an 66 y.o. female.  HPI: 80yoF with hx of HLD and HTN who is not on anticoagulation presenting after an unwitnessed fall last night.  Per the family the patient is mentally disabled and does not answer questions appropriately and is altered at baseline. Per ED provider, stated that exam was difficult due to patient cooperation. CT scan c/w right orbital fractures of the floor and medial wall. OMS consulted for evaluation of orbital fractures. Currently, patient does not acknowledge any complaints, though nursing states that she answers yes to all questions.    Past Medical History:  Diagnosis Date  . Alcoholism ([place]) 09/08/2017  . Avascular necrosis of bones of both hips ([place]) 09/08/2017  . Cognitive developmental delay 09/08/2017  . Hyperlipidemia   . Hypertension   . Right orbit fracture ([place]) 09/08/2017  . Tongue lesion 09/08/2017    Past Surgical History:  Procedure Laterality Date  . gum tumor     removed    Family History  Problem Relation Age of Onset  . Emphysema Mother   . Hypertension Sister   . Heart attack Brother     Social History:  reports that  has never smoked. she has never used smokeless tobacco. She reports that she drinks alcohol. She reports that she does not use drugs.  Allergies: Not on File  Medications: I have reviewed the patient's current medications.  Results for orders placed or performed during the hospital encounter of 09/07/17 (from the past 48 hour(s))  Basic metabolic panel     Status: Abnormal   Collection Time: 09/08/17 10:17 PM  Result Value Ref Range   Sodium 112 (LL) 135 - 145 mmol/L    Comment: CRITICAL RESULT CALLED TO, READ BACK BY AND VERIFIED WITH: GARDNER D,RN 09/08/17 2257 WAYK    Potassium 4.8 3.5 - 5.1 mmol/L   Chloride 85 (L) 101 - 111 mmol/L   CO2 20 (L) 22 - 32 mmol/L   Glucose, Bld 92 65 - 99 mg/dL   BUN 13 6 - 20 mg/dL   Creatinine,  Ser 0.99 0.44 - 1.00 mg/dL   Calcium 7.7 (L) 8.9 - 10.3 mg/dL   GFR calc non Af Amer 57 (L) >60 mL/min   GFR calc Af Amer >60 >60 mL/min    Comment: (NOTE) The eGFR has been calculated using the CKD EPI equation. This calculation has not been validated in all clinical situations. eGFR's persistently <60 mL/min signify possible Chronic Kidney Disease.    Anion gap 7 5 - 15  Magnesium     Status: Abnormal   Collection Time: 09/09/17  5:24 AM  Result Value Ref Range   Magnesium 1.5 (L) 1.7 - 2.4 mg/dL  Phosphorus     Status: Abnormal   Collection Time: 09/09/17  5:24 AM  Result Value Ref Range   Phosphorus 1.9 (L) 2.5 - 4.6 mg/dL  Basic metabolic panel     Status: Abnormal   Collection Time: 09/09/17  5:24 AM  Result Value Ref Range   Sodium 115 (LL) 135 - 145 mmol/L    Comment: CRITICAL RESULT CALLED TO, READ BACK BY AND VERIFIED WITH: T.IRBY,RN 2353 09/09/17 CLARK,S    Potassium 4.1 3.5 - 5.1 mmol/L   Chloride 87 (L) 101 - 111 mmol/L   CO2 22 22 - 32 mmol/L   Glucose, Bld 98 65 - 99 mg/dL   BUN 9 6 - 20 mg/dL   Creatinine, Ser  0.82 0.44 - 1.00 mg/dL   Calcium 8.0 (L) 8.9 - 10.3 mg/dL   GFR calc non Af Amer >60 >60 mL/min   GFR calc Af Amer >60 >60 mL/min    Comment: (NOTE) The eGFR has been calculated using the CKD EPI equation. This calculation has not been validated in all clinical situations. eGFR's persistently <60 mL/min signify possible Chronic Kidney Disease.    Anion gap 6 5 - 15  Glucose, capillary     Status: None   Collection Time: 09/09/17  6:11 AM  Result Value Ref Range   Glucose-Capillary 80 65 - 99 mg/dL  Cortisol     Status: None   Collection Time: 09/09/17  8:41 AM  Result Value Ref Range   Cortisol, Plasma 15.1 ug/dL    Comment: (NOTE) AM    6.7 - 22.6 ug/dL PM   <10.0       ug/dL   Basic metabolic panel     Status: Abnormal   Collection Time: 09/09/17  9:59 AM  Result Value Ref Range   Sodium 114 (LL) 135 - 145 mmol/L    Comment: CRITICAL  RESULT CALLED TO, READ BACK BY AND VERIFIED WITH: JENNIFER JOYCE,RN AT 1203 09/09/17 BY ZBEECH.    Potassium 4.3 3.5 - 5.1 mmol/L   Chloride 85 (L) 101 - 111 mmol/L   CO2 23 22 - 32 mmol/L   Glucose, Bld 95 65 - 99 mg/dL   BUN 7 6 - 20 mg/dL   Creatinine, Ser 0.76 0.44 - 1.00 mg/dL   Calcium 8.2 (L) 8.9 - 10.3 mg/dL   GFR calc non Af Amer >60 >60 mL/min   GFR calc Af Amer >60 >60 mL/min    Comment: (NOTE) The eGFR has been calculated using the CKD EPI equation. This calculation has not been validated in all clinical situations. eGFR's persistently <60 mL/min signify possible Chronic Kidney Disease.    Anion gap 6 5 - 15  Osmolality     Status: Abnormal   Collection Time: 09/09/17  9:59 AM  Result Value Ref Range   Osmolality 237 (LL) 275 - 295 mOsm/kg    Comment: REPEATED TO VERIFY CRITICAL RESULT CALLED TO, READ BACK BY AND VERIFIED WITH: CALLED TO J. JOYCE,RN AT 1156 10/09/2017 BY V THOMPSON   Uric acid     Status: None   Collection Time: 09/09/17  9:59 AM  Result Value Ref Range   Uric Acid, Serum 4.2 2.3 - 6.6 mg/dL  Osmolality, urine     Status: Abnormal   Collection Time: 09/09/17  4:42 PM  Result Value Ref Range   Osmolality, Ur 287 (L) 300 - 900 mOsm/kg  Sodium, urine, random     Status: None   Collection Time: 09/09/17  4:43 PM  Result Value Ref Range   Sodium, Ur 104 mmol/L  Basic metabolic panel     Status: Abnormal   Collection Time: 09/09/17  7:04 PM  Result Value Ref Range   Sodium 114 (LL) 135 - 145 mmol/L    Comment: CRITICAL RESULT CALLED TO, READ BACK BY AND VERIFIED WITH: R AGUIRRE,RN 1956 09/09/17 D BRADLEY    Potassium 3.7 3.5 - 5.1 mmol/L   Chloride 85 (L) 101 - 111 mmol/L   CO2 22 22 - 32 mmol/L   Glucose, Bld 96 65 - 99 mg/dL   BUN 7 6 - 20 mg/dL   Creatinine, Ser 0.73 0.44 - 1.00 mg/dL   Calcium 7.7 (L) 8.9 - 10.3 mg/dL  GFR calc non Af Amer >60 >60 mL/min   GFR calc Af Amer >60 >60 mL/min    Comment: (NOTE) The eGFR has been  calculated using the CKD EPI equation. This calculation has not been validated in all clinical situations. eGFR's persistently <60 mL/min signify possible Chronic Kidney Disease.    Anion gap 7 5 - 15  Magnesium     Status: None   Collection Time: 09/09/17  7:04 PM  Result Value Ref Range   Magnesium 2.2 1.7 - 2.4 mg/dL  Basic metabolic panel     Status: Abnormal   Collection Time: 09/10/17  2:43 AM  Result Value Ref Range   Sodium 114 (LL) 135 - 145 mmol/L    Comment: CRITICAL RESULT CALLED TO, READ BACK BY AND VERIFIED WITH: AGUIRRE R,RN 09/10/17 0322 WAYK    Potassium 3.3 (L) 3.5 - 5.1 mmol/L   Chloride 84 (L) 101 - 111 mmol/L   CO2 21 (L) 22 - 32 mmol/L   Glucose, Bld 91 65 - 99 mg/dL   BUN <5 (L) 6 - 20 mg/dL   Creatinine, Ser 0.76 0.44 - 1.00 mg/dL   Calcium 7.9 (L) 8.9 - 10.3 mg/dL   GFR calc non Af Amer >60 >60 mL/min   GFR calc Af Amer >60 >60 mL/min    Comment: (NOTE) The eGFR has been calculated using the CKD EPI equation. This calculation has not been validated in all clinical situations. eGFR's persistently <60 mL/min signify possible Chronic Kidney Disease.    Anion gap 9 5 - 15  CBC     Status: Abnormal   Collection Time: 09/10/17  2:43 AM  Result Value Ref Range   WBC 7.7 4.0 - 10.5 K/uL   RBC 3.52 (L) 3.87 - 5.11 MIL/uL   Hemoglobin 9.6 (L) 12.0 - 15.0 g/dL   HCT 26.6 (L) 36.0 - 46.0 %   MCV 75.6 (L) 78.0 - 100.0 fL   MCH 27.3 26.0 - 34.0 pg   MCHC 36.1 (H) 30.0 - 36.0 g/dL   RDW 11.7 11.5 - 15.5 %   Platelets 260 150 - 400 K/uL  Basic metabolic panel     Status: Abnormal   Collection Time: 09/10/17  9:57 AM  Result Value Ref Range   Sodium 112 (LL) 135 - 145 mmol/L    Comment: CRITICAL RESULT CALLED TO, READ BACK BY AND VERIFIED WITH: J.JOYCE,RN 09/10/17 1056 B.DAVIS    Potassium 3.4 (L) 3.5 - 5.1 mmol/L   Chloride 84 (L) 101 - 111 mmol/L   CO2 22 22 - 32 mmol/L   Glucose, Bld 121 (H) 65 - 99 mg/dL   BUN <5 (L) 6 - 20 mg/dL   Creatinine, Ser  0.69 0.44 - 1.00 mg/dL   Calcium 8.0 (L) 8.9 - 10.3 mg/dL   GFR calc non Af Amer >60 >60 mL/min   GFR calc Af Amer >60 >60 mL/min    Comment: (NOTE) The eGFR has been calculated using the CKD EPI equation. This calculation has not been validated in all clinical situations. eGFR's persistently <60 mL/min signify possible Chronic Kidney Disease.    Anion gap 6 5 - 15  Basic metabolic panel     Status: Abnormal   Collection Time: 09/10/17  2:51 PM  Result Value Ref Range   Sodium 115 (LL) 135 - 145 mmol/L    Comment: CRITICAL RESULT CALLED TO, READ BACK BY AND VERIFIED WITH: Lavada Mesi 1602 09/10/2017 WBOND    Potassium 3.4 (L) 3.5 -  5.1 mmol/L   Chloride 86 (L) 101 - 111 mmol/L   CO2 23 22 - 32 mmol/L   Glucose, Bld 91 65 - 99 mg/dL   BUN 5 (L) 6 - 20 mg/dL   Creatinine, Ser 0.68 0.44 - 1.00 mg/dL   Calcium 7.6 (L) 8.9 - 10.3 mg/dL   GFR calc non Af Amer >60 >60 mL/min   GFR calc Af Amer >60 >60 mL/min    Comment: (NOTE) The eGFR has been calculated using the CKD EPI equation. This calculation has not been validated in all clinical situations. eGFR's persistently <60 mL/min signify possible Chronic Kidney Disease.    Anion gap 6 5 - 15  Basic metabolic panel     Status: Abnormal   Collection Time: 09/10/17  6:44 PM  Result Value Ref Range   Sodium 116 (LL) 135 - 145 mmol/L    Comment: CRITICAL RESULT CALLED TO, READ BACK BY AND VERIFIED WITH: AGUIRRE,R RN 09/10/2017 2017 JORDANS    Potassium 3.5 3.5 - 5.1 mmol/L   Chloride 86 (L) 101 - 111 mmol/L   CO2 23 22 - 32 mmol/L   Glucose, Bld 103 (H) 65 - 99 mg/dL   BUN <5 (L) 6 - 20 mg/dL   Creatinine, Ser 0.67 0.44 - 1.00 mg/dL   Calcium 7.8 (L) 8.9 - 10.3 mg/dL   GFR calc non Af Amer >60 >60 mL/min   GFR calc Af Amer >60 >60 mL/min    Comment: (NOTE) The eGFR has been calculated using the CKD EPI equation. This calculation has not been validated in all clinical situations. eGFR's persistently <60 mL/min signify possible  Chronic Kidney Disease.    Anion gap 7 5 - 15    No results found.  ROS: unable to perform Blood pressure 129/89, pulse 82, temperature 98.2 F (36.8 C), temperature source Axillary, resp. rate 17, height 5' 2"  (1.575 m), weight 74.4 kg (164 lb 0.4 oz), SpO2 99 %. Physical Exam  Gen: patient appears alert, nad HEENT: mild-mod right periorbital and right malar edema with contusion. There appears to be a healing laceration approx 2 cm on the right brow. No appreciable periorbital bony stepoff defects. No proptosis. Mod right subconjuctival heme. anisocoria note with right dilated pupil with minimal reaction. Left pupil is reactive. EOM difficult to assess as she does not follow commands. Able to move right eye, but appears limited in right lateral gaze. Unable to assess visual acuity.  Maxilla/mandible stable. Neck supple, trachea midline.  Assessment/Plan: Mildly diplaced right orbital floor and medial wall fractures. No obvious entrapment on scan. There is no acute surgical intervention warranted for the orbital fractures. Movements limited likely due to edema. Recommend ophthalmology consult. At this time recommend decadron 27m q8h x 3 doses to combat edema given CT findings. Patient to follow up in office in 7-10 days to reassess EOM once edema has resolved.   *contact [phone] questions/consers regarding her orbital fractures. Thank you.  JMichael Litter DMD Oral & Maxillofacial Surgery 09/10/2017, 8:31 PM

## 2017-09-10 NOTE — Progress Notes (Addendum)
CRITICAL VALUE ALERT  Critical Value:  Sodium 112  Date & Time Notied:  09/10/17, 1110  Provider Notified: Leodis BinetP. Patel MD  Orders Received/Actions taken: new orders received as necessary
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
## 2017-09-10 NOTE — Progress Notes (Addendum)
CRITICAL VALUE ALERT  Critical Value:Na -114  Date & Time Notied:09/10/17/,0320  Provider Notified: K Schorr  Orders Received/Actions taken:None
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
## 2017-09-11 LAB — BASIC METABOLIC PANEL
Anion gap: 7 (ref 5–15)
Anion gap: 5 (ref 5–15)
Anion gap: 6 (ref 5–15)
BUN: <5 mg/dL — ABNORMAL LOW (ref 6–20)
BUN: 5 mg/dL — AB (ref 6–20)
BUN: 6 mg/dL (ref 6–20)
CALCIUM: 8.1 mg/dL — AB (ref 8.9–10.3)
CHLORIDE: 100 mmol/L — AB (ref 101–111)
CHLORIDE: 101 mmol/L (ref 101–111)
CO2: 22 mmol/L (ref 22–32)
CO2: 23 mmol/L (ref 22–32)
CO2: 23 mmol/L (ref 22–32)
CREATININE: 0.67 mg/dL (ref 0.44–1.00)
CREATININE: 0.84 mg/dL (ref 0.44–1.00)
Calcium: 8 mg/dL — ABNORMAL LOW (ref 8.9–10.3)
Calcium: 8.1 mg/dL — ABNORMAL LOW (ref 8.9–10.3)
Chloride: 89 mmol/L — ABNORMAL LOW (ref 101–111)
Creatinine, Ser: 0.83 mg/dL (ref 0.44–1.00)
GFR calc Af Amer: >60 mL/min (ref >60)
GFR calc Af Amer: >60 mL/min (ref >60)
GFR calc non Af Amer: >60 mL/min (ref >60)
GFR calc non Af Amer: >60 mL/min (ref >60)
GFR calc non Af Amer: >60 mL/min (ref >60)
GLUCOSE: 120 mg/dL — AB (ref 65–99)
Glucose, Bld: 88 mg/dL (ref 65–99)
Glucose, Bld: 138 mg/dL — ABNORMAL HIGH (ref 65–99)
POTASSIUM: 3.8 mmol/L (ref 3.5–5.1)
Potassium: 3.5 mmol/L (ref 3.5–5.1)
Potassium: 3.9 mmol/L (ref 3.5–5.1)
SODIUM: 128 mmol/L — AB (ref 135–145)
Sodium: 118 mmol/L — CL (ref 135–145)
Sodium: 130 mmol/L — ABNORMAL LOW (ref 135–145)

## 2017-09-11 LAB — CBC
HCT: 25.8 % — ABNORMAL LOW (ref 36.0–46.0)
Hemoglobin: 9.1 g/dL — ABNORMAL LOW (ref 12.0–15.0)
MCH: 26.8 pg (ref 26.0–34.0)
MCHC: 35.3 g/dL (ref 30.0–36.0)
MCV: 75.9 fL — ABNORMAL LOW (ref 78.0–100.0)
PLATELETS: 277 10*3/uL (ref 150–400)
RBC: 3.4 MIL/uL — AB (ref 3.87–5.11)
RDW: 11.8 % (ref 11.5–15.5)
WBC: 7.1 10*3/uL (ref 4.0–10.5)

## 2017-09-11 LAB — MAGNESIUM
MAGNESIUM: 1.2 mg/dL — AB (ref 1.7–2.4)
Magnesium: 2.1 mg/dL (ref 1.7–2.4)

## 2017-09-11 LAB — PHOSPHORUS: Phosphorus: 3.5 mg/dL (ref 2.5–4.6)

## 2017-09-11 LAB — GLUCOSE, CAPILLARY: Glucose-Capillary: 85 mg/dL (ref 65–99)

## 2017-09-11 MED ORDER — MAGNESIUM SULFATE 4 GM/100ML IV SOLN
4.0000 g | Freq: Once | INTRAVENOUS | Status: AC
  Administered 2017-09-11: 4 g via INTRAVENOUS
  Filled 2017-09-11: qty 100

## 2017-09-11 MED ORDER — POTASSIUM CHLORIDE CRYS ER 20 MEQ PO TBCR
40.0000 meq | EXTENDED_RELEASE_TABLET | Freq: Once | ORAL | Status: AC
  Administered 2017-09-11: 40 meq via ORAL
  Filled 2017-09-11: qty 2

## 2017-09-11 MED ORDER — ENSURE ENLIVE PO LIQD
237.0000 mL | Freq: Three times a day (TID) | ORAL | Status: DC
  Administered 2017-09-11: 237 mL via ORAL

## 2017-09-11 NOTE — Care Management Important Message (Signed)
Important Message  Patient Details  Name: Norma Ayers MRN: [id number] Date of Birth: 04/14/1951   Medicare Important Message Given:  Yes    Galo Sayed Abena 09/11/2017, 12:30 PM
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
## 2017-09-11 NOTE — Consult Note (Signed)
Reason for Consult: Orbital Floor Fracture Right Eye  Referring Physician: Dr. Tilda Burrow is an 66 y.o. female.  HPI: 66 y.o female who was admitted after falling at home. Review of the records reveal that the patient has a pmh of cognitive impairment and alcoholism. The patient has profound hyponatremia. Patient was imaged and was found to have a right orbital floor fracture. Currently the patient has no complaints of double vision but the patient reports that her eye's hurt upon questioning.   Past Medical History:  Diagnosis Date  . Alcoholism ([place]) 09/08/2017  . Avascular necrosis of bones of both hips ([place]) 09/08/2017  . Cognitive developmental delay 09/08/2017  . Hyperlipidemia   . Hypertension   . Right orbit fracture ([place]) 09/08/2017  . Tongue lesion 09/08/2017    Past Surgical History:  Procedure Laterality Date  . gum tumor     removed    Family History  Problem Relation Age of Onset  . Emphysema Mother   . Hypertension Sister   . Heart attack Brother     Social History:  reports that  has never smoked. she has never used smokeless tobacco. She reports that she drinks alcohol. She reports that she does not use drugs.  Allergies: Not on File  Medications: I have reviewed the patient's current medications.  Results for orders placed or performed during the hospital encounter of 09/07/17 (from the past 48 hour(s))  Osmolality, urine     Status: Abnormal   Collection Time: 09/09/17  4:42 PM  Result Value Ref Range   Osmolality, Ur 287 (L) 300 - 900 mOsm/kg  Sodium, urine, random     Status: None   Collection Time: 09/09/17  4:43 PM  Result Value Ref Range   Sodium, Ur 104 mmol/L  Basic metabolic panel     Status: Abnormal   Collection Time: 09/09/17  7:04 PM  Result Value Ref Range   Sodium 114 (LL) 135 - 145 mmol/L    Comment: CRITICAL RESULT CALLED TO, READ BACK BY AND VERIFIED WITH: R AGUIRRE,RN 1956 09/09/17 D BRADLEY    Potassium 3.7 3.5 - 5.1  mmol/L   Chloride 85 (L) 101 - 111 mmol/L   CO2 22 22 - 32 mmol/L   Glucose, Bld 96 65 - 99 mg/dL   BUN 7 6 - 20 mg/dL   Creatinine, Ser 0.73 0.44 - 1.00 mg/dL   Calcium 7.7 (L) 8.9 - 10.3 mg/dL   GFR calc non Af Amer >60 >60 mL/min   GFR calc Af Amer >60 >60 mL/min    Comment: (NOTE) The eGFR has been calculated using the CKD EPI equation. This calculation has not been validated in all clinical situations. eGFR's persistently <60 mL/min signify possible Chronic Kidney Disease.    Anion gap 7 5 - 15  Magnesium     Status: None   Collection Time: 09/09/17  7:04 PM  Result Value Ref Range   Magnesium 2.2 1.7 - 2.4 mg/dL  Basic metabolic panel     Status: Abnormal   Collection Time: 09/10/17  2:43 AM  Result Value Ref Range   Sodium 114 (LL) 135 - 145 mmol/L    Comment: CRITICAL RESULT CALLED TO, READ BACK BY AND VERIFIED WITH: AGUIRRE R,RN 09/10/17 0322 WAYK    Potassium 3.3 (L) 3.5 - 5.1 mmol/L   Chloride 84 (L) 101 - 111 mmol/L   CO2 21 (L) 22 - 32 mmol/L   Glucose, Bld 91 65 -  99 mg/dL   BUN <5 (L) 6 - 20 mg/dL   Creatinine, Ser 0.76 0.44 - 1.00 mg/dL   Calcium 7.9 (L) 8.9 - 10.3 mg/dL   GFR calc non Af Amer >60 >60 mL/min   GFR calc Af Amer >60 >60 mL/min    Comment: (NOTE) The eGFR has been calculated using the CKD EPI equation. This calculation has not been validated in all clinical situations. eGFR's persistently <60 mL/min signify possible Chronic Kidney Disease.    Anion gap 9 5 - 15  CBC     Status: Abnormal   Collection Time: 09/10/17  2:43 AM  Result Value Ref Range   WBC 7.7 4.0 - 10.5 K/uL   RBC 3.52 (L) 3.87 - 5.11 MIL/uL   Hemoglobin 9.6 (L) 12.0 - 15.0 g/dL   HCT 26.6 (L) 36.0 - 46.0 %   MCV 75.6 (L) 78.0 - 100.0 fL   MCH 27.3 26.0 - 34.0 pg   MCHC 36.1 (H) 30.0 - 36.0 g/dL   RDW 11.7 11.5 - 15.5 %   Platelets 260 150 - 400 K/uL  Basic metabolic panel     Status: Abnormal   Collection Time: 09/10/17  9:57 AM  Result Value Ref Range   Sodium 112  (LL) 135 - 145 mmol/L    Comment: CRITICAL RESULT CALLED TO, READ BACK BY AND VERIFIED WITH: J.JOYCE,RN 09/10/17 1056 B.DAVIS    Potassium 3.4 (L) 3.5 - 5.1 mmol/L   Chloride 84 (L) 101 - 111 mmol/L   CO2 22 22 - 32 mmol/L   Glucose, Bld 121 (H) 65 - 99 mg/dL   BUN <5 (L) 6 - 20 mg/dL   Creatinine, Ser 0.69 0.44 - 1.00 mg/dL   Calcium 8.0 (L) 8.9 - 10.3 mg/dL   GFR calc non Af Amer >60 >60 mL/min   GFR calc Af Amer >60 >60 mL/min    Comment: (NOTE) The eGFR has been calculated using the CKD EPI equation. This calculation has not been validated in all clinical situations. eGFR's persistently <60 mL/min signify possible Chronic Kidney Disease.    Anion gap 6 5 - 15  Basic metabolic panel     Status: Abnormal   Collection Time: 09/10/17  2:51 PM  Result Value Ref Range   Sodium 115 (LL) 135 - 145 mmol/L    Comment: CRITICAL RESULT CALLED TO, READ BACK BY AND VERIFIED WITH: Lavada Mesi 1602 09/10/2017 WBOND    Potassium 3.4 (L) 3.5 - 5.1 mmol/L   Chloride 86 (L) 101 - 111 mmol/L   CO2 23 22 - 32 mmol/L   Glucose, Bld 91 65 - 99 mg/dL   BUN 5 (L) 6 - 20 mg/dL   Creatinine, Ser 0.68 0.44 - 1.00 mg/dL   Calcium 7.6 (L) 8.9 - 10.3 mg/dL   GFR calc non Af Amer >60 >60 mL/min   GFR calc Af Amer >60 >60 mL/min    Comment: (NOTE) The eGFR has been calculated using the CKD EPI equation. This calculation has not been validated in all clinical situations. eGFR's persistently <60 mL/min signify possible Chronic Kidney Disease.    Anion gap 6 5 - 15  Basic metabolic panel     Status: Abnormal   Collection Time: 09/10/17  6:44 PM  Result Value Ref Range   Sodium 116 (LL) 135 - 145 mmol/L    Comment: CRITICAL RESULT CALLED TO, READ BACK BY AND VERIFIED WITH: AGUIRRE,R RN 09/10/2017 2017 JORDANS    Potassium 3.5 3.5 -  5.1 mmol/L   Chloride 86 (L) 101 - 111 mmol/L   CO2 23 22 - 32 mmol/L   Glucose, Bld 103 (H) 65 - 99 mg/dL   BUN <5 (L) 6 - 20 mg/dL   Creatinine, Ser 0.67 0.44 - 1.00  mg/dL   Calcium 7.8 (L) 8.9 - 10.3 mg/dL   GFR calc non Af Amer >60 >60 mL/min   GFR calc Af Amer >60 >60 mL/min    Comment: (NOTE) The eGFR has been calculated using the CKD EPI equation. This calculation has not been validated in all clinical situations. eGFR's persistently <60 mL/min signify possible Chronic Kidney Disease.    Anion gap 7 5 - 15  Basic metabolic panel     Status: Abnormal   Collection Time: 09/10/17  9:47 PM  Result Value Ref Range   Sodium 114 (LL) 135 - 145 mmol/L    Comment: CRITICAL RESULT CALLED TO, READ BACK BY AND VERIFIED WITH: R AGURRIE,RN 2238 09/10/2017 WBOND    Potassium 3.5 3.5 - 5.1 mmol/L   Chloride 86 (L) 101 - 111 mmol/L   CO2 22 22 - 32 mmol/L   Glucose, Bld 99 65 - 99 mg/dL   BUN <5 (L) 6 - 20 mg/dL   Creatinine, Ser 0.72 0.44 - 1.00 mg/dL   Calcium 7.8 (L) 8.9 - 10.3 mg/dL   GFR calc non Af Amer >60 >60 mL/min   GFR calc Af Amer >60 >60 mL/min    Comment: (NOTE) The eGFR has been calculated using the CKD EPI equation. This calculation has not been validated in all clinical situations. eGFR's persistently <60 mL/min signify possible Chronic Kidney Disease.    Anion gap 6 5 - 15  Basic metabolic panel     Status: Abnormal   Collection Time: 09/11/17  4:06 AM  Result Value Ref Range   Sodium 118 (LL) 135 - 145 mmol/L    Comment: CRITICAL RESULT CALLED TO, READ BACK BY AND VERIFIED WITH: R.AGUIRRE,RN 0458 09/11/17 M.CAMPBELL    Potassium 3.5 3.5 - 5.1 mmol/L   Chloride 89 (L) 101 - 111 mmol/L   CO2 22 22 - 32 mmol/L   Glucose, Bld 88 65 - 99 mg/dL   BUN <5 (L) 6 - 20 mg/dL   Creatinine, Ser 0.67 0.44 - 1.00 mg/dL   Calcium 8.1 (L) 8.9 - 10.3 mg/dL   GFR calc non Af Amer >60 >60 mL/min   GFR calc Af Amer >60 >60 mL/min    Comment: (NOTE) The eGFR has been calculated using the CKD EPI equation. This calculation has not been validated in all clinical situations. eGFR's persistently <60 mL/min signify possible Chronic  Kidney Disease.    Anion gap 7 5 - 15  CBC     Status: Abnormal   Collection Time: 09/11/17  4:06 AM  Result Value Ref Range   WBC 7.1 4.0 - 10.5 K/uL   RBC 3.40 (L) 3.87 - 5.11 MIL/uL   Hemoglobin 9.1 (L) 12.0 - 15.0 g/dL   HCT 25.8 (L) 36.0 - 46.0 %   MCV 75.9 (L) 78.0 - 100.0 fL   MCH 26.8 26.0 - 34.0 pg   MCHC 35.3 30.0 - 36.0 g/dL   RDW 11.8 11.5 - 15.5 %   Platelets 277 150 - 400 K/uL  Magnesium     Status: Abnormal   Collection Time: 09/11/17  4:06 AM  Result Value Ref Range   Magnesium 1.2 (L) 1.7 - 2.4 mg/dL  Phosphorus  Status: None   Collection Time: 09/11/17  4:06 AM  Result Value Ref Range   Phosphorus 3.5 2.5 - 4.6 mg/dL  Glucose, capillary     Status: None   Collection Time: 09/11/17  6:00 AM  Result Value Ref Range   Glucose-Capillary 85 65 - 99 mg/dL    No results found.   IMAGING:  CT MAX FACE IMPRESSION: 1. Acute right orbital floor and medial orbital wall fractures with fracture involving the canal for the right maxillary nerve seen along the medial orbital floor. There is 3 mm of caudal displacement of the orbital floor fracture with herniation of periorbital fat and minimal herniation of the right maxillary nerve. 2. Associated with the right orbital fractures are a small amount of medial extraconal hemorrhage/fluid as well as small amount of lateral intraconal hemorrhage. Hyperdense fluid in the right maxillary sinus would be in keeping with hemorrhage/blood products. Right periorbital soft tissue swelling is noted. 3. No acute intracranial abnormality. 4. Mild cervical spondylosis without acute cervical spine fracture.   Review of Systems  Eyes: Positive for pain. Negative for blurred vision, double vision, discharge and redness.   Blood pressure 117/77, pulse 80, temperature 98.7 F (37.1 C), temperature source Oral, resp. rate (!) 22, height 5' 2"  (1.575 m), weight 75.7 kg (166 lb 14.2 oz), SpO2 99 %. Physical Exam  Eyes: Right eye  exhibits chemosis and discharge. Right eye exhibits no exudate and no hordeolum. No foreign body present in the right eye. Left eye exhibits no chemosis, no discharge, no exudate and no hordeolum. No foreign body present in the left eye. Right conjunctiva is injected. Right conjunctiva has a hemorrhage. Left conjunctiva is not injected. Left conjunctiva has no hemorrhage. No scleral icterus. Right eye exhibits abnormal extraocular motion. Right eye exhibits no nystagmus. Left eye exhibits abnormal extraocular motion. Left eye exhibits no nystagmus. Right pupil is not reactive. Right pupil is round. Left pupil is not round. Left pupil is reactive. Pupils are unequal.  Fundoscopic exam:      The right eye shows no arteriolar narrowing, no AV nicking, no exudate, no hemorrhage and no papilledema. The right eye shows red reflex.       The left eye shows no arteriolar narrowing, no AV nicking, no exudate, no hemorrhage and no papilledema. The left eye shows red reflex.  Slit lamp exam:      The right eye shows fluorescein uptake. The right eye shows no corneal abrasion, no corneal flare, no corneal ulcer, no foreign body, no hyphema, no hypopyon and no anterior chamber bulge.       The left eye shows no corneal abrasion, no corneal flare, no corneal ulcer, no foreign body, no hyphema, no hypopyon, no fluorescein uptake and no anterior chamber bulge.  Poor compliance, attention wavered and inconsistent answers IOP - OD 25 with patient squeezing, patient would not allow me to check IOP OS SLE- OS tear drop appearance to pupil with constriction OU VA 20/800 [place] with near card but when checked separately the patient reported that she could not see anything EOM- OD limit in abduction and elevation, OS EOM full grossly  Fundus- hard to fully evaluate with patients cooperation OU nerve cd .3 with healthy rim  slight vascular tortuosity noted ou otherwise wnl         Assessment/Plan: Right Orbital Floor  Fracture - upon review of the imaging there appears to be mild entrapment of the inferior rectus muscle. On exam the patient has  some abduction limitation as well as supra duction deficit consistent with imaging. This may resolve with time as swelling decreases -Recommend repeat exam in 3 weeks after discharge for a repeat exam to evaluate for sequela of the fracture, patient does not seem to be symptomatic at this point but with patient's state of mind not completely sure if that is the case Right Non Reactive Pupil unsure wether this was present prior to admission, nerve appears to be healthy but if the injury was recent in can take several days to appreciate true pallor, but no papilledema is noted and neuro imaging was wnl.  OS abnormal pupil appears to be a sphincter tear inferiorly Will observe for now.  Recommend f/u w/ ophthalmology in 3 weeks after discharge for reassesment   Clista Bernhardt 09/11/2017, 2:26 PM

## 2017-09-11 NOTE — [place] FL2 (Signed)
[place] MEDICAID FL2 LEVEL OF CARE SCREENING TOOL     IDENTIFICATION  Patient Name: Norma Ayers Birthdate: 11/12/1950 Sex: female Admission Date (Current Location): 09/07/2017  [place]   Facility and Address:  [facility] [place], [address], [place], [place] [address]      Provider Number: [id number]  Attending Physician Name and Address:  Rolly SalterPatel, Pranav M, MD  Relative Name and Phone Number:  Kandis Nabeggy Kline, [phone]    Current Level of Care: Hospital Recommended Level of Care: Skilled Nursing Facility Prior Approval Number:    Date Approved/Denied:   PASRR Number: [id number]  Discharge Plan: SNF    Current Diagnoses: Patient Active Problem List   Diagnosis Date Noted  . Right orbit fracture (HCC) 09/08/2017  . Alcoholism (HCC) 09/08/2017  . Tongue lesion 09/08/2017  . Cognitive developmental delay 09/08/2017  . Avascular necrosis of bones of both hips (HCC) 09/08/2017  . Hyponatremia 09/07/2017  . Hyperlipidemia     Orientation RESPIRATION BLADDER Height & Weight     Self(has cognitive delay)  Normal External catheter Weight: 166 lb 14.2 oz (75.7 kg) Height:  5\' 2"  (157.5 cm)  BEHAVIORAL SYMPTOMS/MOOD NEUROLOGICAL BOWEL NUTRITION STATUS      Continent Diet(regular)  AMBULATORY STATUS COMMUNICATION OF NEEDS Skin     Verbally(answers yes to everything and has delays) Bruising(bruising left side of face)                       Personal Care Assistance Level of Assistance              Functional Limitations Info  Sight, Hearing, Speech Sight Info: Impaired Hearing Info: Impaired Speech Info: Impaired    SPECIAL CARE FACTORS FREQUENCY  PT (By licensed PT), OT (By licensed OT)     PT Frequency: 5x wk OT Frequency: 5x wk            Contractures Contractures Info: Not present    Additional Factors Info  Code Status, Allergies Code Status Info: Full Code Allergies Info: Not on  file           Current Medications (09/11/2017):  This is the current hospital active medication list Current Facility-Administered Medications  Medication Dose Route Frequency Provider Last Rate Last Dose  . 0.9 %  sodium chloride infusion   Intravenous Continuous Rolly SalterPatel, Pranav M, MD 75 mL/hr at 09/11/17 1040    . acetaminophen (TYLENOL) tablet 650 mg  650 mg Oral Q6H PRN Debe CoderMullen, Emily B, MD   650 mg at 09/08/17 1552   Or  . acetaminophen (TYLENOL) suppository 650 mg  650 mg Rectal Q6H PRN Inez CatalinaMullen, Emily B, MD      . folic acid (FOLVITE) tablet 1 mg  1 mg Oral Daily Debe CoderMullen, Emily B, MD   1 mg at 09/11/17 0841  . hydrALAZINE (APRESOLINE) injection 5 mg  5 mg Intravenous Q8H PRN Debe CoderMullen, Emily B, MD   5 mg at 09/10/17 0500  . LORazepam (ATIVAN) injection 2-3 mg  2-3 mg Intravenous Q1H PRN Debe CoderMullen, Emily B, MD      . multivitamin with minerals tablet 1 tablet  1 tablet Oral Daily Inez CatalinaMullen, Emily B, MD   1 tablet at 09/11/17 0840  . ondansetron (ZOFRAN) tablet 4 mg  4 mg Oral Q6H PRN Inez CatalinaMullen, Emily B, MD       Or  . ondansetron [facility](ZOFRAN) injection 4 mg  4 mg Intravenous Q6H PRN  Debe CoderMullen, Emily B, MD      . sodium chloride tablet 2 g  2 g Oral TID WC Rolly SalterPatel, Pranav M, MD   2 g at 09/11/17 0840  . thiamine (VITAMIN B-1) tablet 100 mg  100 mg Oral Daily Debe CoderMullen, Emily B, MD   100 mg at 09/11/17 0840     Discharge Medications: Please see discharge summary for a list of discharge medications.  Relevant Imaging Results:  Relevant Lab Results:   Additional Information SSN: [id number]  Abigail ButtsSusan Khira Cudmore, LCSW
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
## 2017-09-11 NOTE — Plan of Care (Signed)
Care plan updated.

## 2017-09-11 NOTE — Progress Notes (Addendum)
Initial Nutrition Assessment  DOCUMENTATION CODES:   Not applicable  INTERVENTION:    Ensure Enlive po TID, each supplement provides 350 kcal and 20 grams of protein  NUTRITION DIAGNOSIS:   Increased nutrient needs related to chronic illness as evidenced by estimated needs  GOAL:   Patient will meet greater than or equal to 90% of their needs  MONITOR:   PO intake, Supplement acceptance, Labs, Skin, I & O's  REASON FOR ASSESSMENT:   Consult Assessment of nutrition requirement/status  ASSESSMENT:   66 yo with PMH of cognitive impairment, alcoholism presented after reported fall at home. Workup revealed right orbital fxs and profound hyponatremia.   RD briefly spoke with pt's sister. Case Manager in with pt at time of visit. Sister reports pt has had poor PO intake PTA. States "she doesn't eat, she drinks". Also reveals she'll try and bring pt her favorite foods but pt still does not consume.   RD suspects some degree of malnutrition. Unable to identify at this time. Medications reviewed and include folvite, MVI and Vitamin B-1. Labs reviewed. Na 118 (L). Mg 1.2 (L). CBG's 80-85.  NUTRITION - FOCUSED PHYSICAL EXAM:    Most Recent Value  Orbital Region  Unable to assess  Upper Arm Region  Unable to assess  Thoracic and Lumbar Region  Unable to assess  Buccal Region  Unable to assess  Temple Region  Unable to assess  Clavicle Bone Region  Unable to assess  Clavicle and Acromion Bone Region  Unable to assess  Scapular Bone Region  Unable to assess  Dorsal Hand  Unable to assess  Patellar Region  Unable to assess  Anterior Thigh Region  Unable to assess  Posterior Calf Region  Unable to assess  Edema (RD Assessment)  Unable to assess    Diet Order:  Diet regular Room service appropriate? Yes; Fluid consistency: Thin; Fluid restriction: Other (see comments)  EDUCATION NEEDS:   No education needs have been identified at this time  Skin:  Skin Assessment:  Reviewed RN Assessment  Last BM:  11/25  Height:   Ht Readings from Last 1 Encounters:  09/09/17 5\' 2"  (1.575 m)   Weight:   Wt Readings from Last 1 Encounters:  09/11/17 166 lb 14.2 oz (75.7 kg)   Ideal Body Weight:  50 kg  BMI:  Body mass index is 30.52 kg/m.  Estimated Nutritional Needs:   Kcal:  1800-2000  Protein:  90-105 gm  Fluid:  1.8-2.0 L  Maureen ChattersKatie Bryna Razavi, RD, LDN Pager #: [phone] After-Hours Pager #: [phone]

## 2017-09-12 DIAGNOSIS — F819 Developmental disorder of scholastic skills, unspecified: Secondary | ICD-10-CM

## 2017-09-12 DIAGNOSIS — S0230XB Fracture of orbital floor, unspecified side, initial encounter for open fracture: Secondary | ICD-10-CM | POA: Diagnosis not present

## 2017-09-12 DIAGNOSIS — F1020 Alcohol dependence, uncomplicated: Secondary | ICD-10-CM

## 2017-09-12 DIAGNOSIS — M87052 Idiopathic aseptic necrosis of left femur: Secondary | ICD-10-CM

## 2017-09-12 DIAGNOSIS — I10 Essential (primary) hypertension: Secondary | ICD-10-CM

## 2017-09-12 DIAGNOSIS — M87051 Idiopathic aseptic necrosis of right femur: Secondary | ICD-10-CM

## 2017-09-12 DIAGNOSIS — E871 Hypo-osmolality and hyponatremia: Principal | ICD-10-CM

## 2017-09-12 DIAGNOSIS — H219 Unspecified disorder of iris and ciliary body: Secondary | ICD-10-CM | POA: Diagnosis not present

## 2017-09-12 DIAGNOSIS — H5704 Mydriasis: Secondary | ICD-10-CM | POA: Diagnosis not present

## 2017-09-12 LAB — BASIC METABOLIC PANEL
Anion gap: 5 (ref 5–15)
BUN: 6 mg/dL (ref 6–20)
CO2: 24 mmol/L (ref 22–32)
CREATININE: 0.87 mg/dL (ref 0.44–1.00)
Calcium: 8.6 mg/dL — ABNORMAL LOW (ref 8.9–10.3)
Chloride: 101 mmol/L (ref 101–111)
Glucose, Bld: 104 mg/dL — ABNORMAL HIGH (ref 65–99)
Potassium: 4.1 mmol/L (ref 3.5–5.1)
SODIUM: 130 mmol/L — AB (ref 135–145)

## 2017-09-12 LAB — CBC
HEMATOCRIT: 25.2 % — AB (ref 36.0–46.0)
Hemoglobin: 8.9 g/dL — ABNORMAL LOW (ref 12.0–15.0)
MCH: 27.7 pg (ref 26.0–34.0)
MCHC: 35.3 g/dL (ref 30.0–36.0)
MCV: 78.5 fL (ref 78.0–100.0)
Platelets: 275 10*3/uL (ref 150–400)
RBC: 3.21 MIL/uL — AB (ref 3.87–5.11)
RDW: 12.2 % (ref 11.5–15.5)
WBC: 6.9 10*3/uL (ref 4.0–10.5)

## 2017-09-12 LAB — GLUCOSE, CAPILLARY
Glucose-Capillary: 101 mg/dL — ABNORMAL HIGH (ref 65–99)
Glucose-Capillary: 111 mg/dL — ABNORMAL HIGH (ref 65–99)

## 2017-09-12 MED ORDER — THIAMINE HCL 100 MG PO TABS: 100.0000 mg | ORAL_TABLET | Freq: Every day | ORAL | Status: DC

## 2017-09-12 MED ORDER — FOLIC ACID 1 MG PO TABS: 1.0000 mg | ORAL_TABLET | Freq: Every day | ORAL | Status: AC
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
## 2017-09-12 NOTE — Progress Notes (Signed)
Patient will discharge to [facility]. Anticipated discharge date: 09/12/17 Family notified: Kandis Nabeggy Kline, sister Transportation by: [facility]  Nurse to call report to [phone]. Patient will go to room 219B at the facility.   CSW signing off.  Abigail ButtsSusan Saban Heinlen, LCSWA  Clinical Social Worker

## 2017-09-12 NOTE — Clinical Social Work Placement (Signed)
   CLINICAL SOCIAL WORK PLACEMENT  NOTE  Date:  09/12/2017  Patient Details  Name: Norma Ayers MRN: [id number] Date of Birth: 04/17/1951  Clinical Social Work is seeking post-discharge placement for this patient at the Skilled  Nursing Facility level of care (*CSW will initial, date and re-position this form in  chart as items are completed):  Yes   Patient/family provided with [facility] Clinical Social Work Department's list of facilities offering this level of care within the geographic area requested by the patient (or if unable, by the patient's family).  Yes   Patient/family informed of their freedom to choose among providers that offer the needed level of care, that participate in Medicare, Medicaid or managed care program needed by the patient, have an available bed and are willing to accept the patient.  Yes   Patient/family informed of [facility]'s ownership interest in [facility] and [facility], as well as of the fact that they are under no obligation to receive care at these facilities.  PASRR submitted to EDS on 09/11/17     PASRR number received on 09/11/17     Existing PASRR number confirmed on       FL2 transmitted to all facilities in geographic area requested by pt/family on 09/11/17     FL2 transmitted to all facilities within larger geographic area on       Patient informed that his/her managed care company has contracts with or will negotiate with certain facilities, including the following:  [facility]         Patient/family informed of bed offers received.  [facility]     Physician recommends and patient chooses bed at      [facility] on 09/12/17.  Patient to be transferred to facility by [facility]     Patient family notified on 09/12/17 of transfer.  Name of family member notified:  Kandis Nabeggy Kline,  sister     PHYSICIAN Please prepare priority discharge summary, including medications, Please prepare prescriptions     Additional Comment:    _______________________________________________ Abigail ButtsSusan Erza Mothershead, LCSW 09/12/2017, 11:36 AM

## 2017-09-12 NOTE — Plan of Care (Signed)
Patient discharged to SNF. PIV x1 removed per protocol. Telemetry removed and CCMD notified. All belongings sent with patient's family member. Report to be called to SNF.  Leanna BattlesEckelmann, Hayden Mabin Eileen, RN
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
## 2017-09-12 NOTE — Discharge Summary (Signed)
Physician Discharge Summary  Norma Ayers ZOX:[id number] DOB: Apr 14, 1951 DOA: 09/07/2017  PCP: Richmond Campbell., PA-C  Admit date: 09/07/2017 Discharge date: 09/12/2017  Time spent: 35 minutes  Recommendations for Outpatient Follow-up:  1. Please ensure follow-up with ophthalmology Dr.Abugo in 3 weeks 2. 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 Workup revealed right orbital fxs and profound hyponatremia    Hospital Course:   Hyponatremia,  multifactorial including dehydration, HCTZ and probable "beer drinker's potomania".  - mental status at baseline per family, hence asymptomatic, -improved with hydration, stopping HCTZ and a dose of demeclocycline -Plasma cortisol normal.  TSH normal.  -Na 130 and stable now  Right orbital fractures - ENT consult appreciated.  Done by Dr. Kenney Houseman.  No further workup and treatment recommended at present -Ophthalmology evaluation.Dr. Harvel Quale consulted, exam limited due to difficulty following commands, she recommended FU with her in 3weeks  Elevated CK s/p reported fall -improved with hydration  Alcoholism - no withdrawal noted, counseled  Leukoplakia tongue - outpatient ENT eval for biopsy  Essential HTN - stable, continue lisinopril. HCTZ discontinued  Cognitive delay/Dementia suspected - at baseline per sister. Patient lives alone. Family takes patient shopping  and enables her to buy alcohol. -pleasantly confused for the most part  Hypokalemia, hypomagnesemia, hypophosphatemia. -replaced  Severe degenerative disease, bilateral hip AVN   In addition to acute issues there is concern for living arrangements, enabling alcoholism. No definite abuse though some vague history suggested by sister involving son  Consulted PT, OT, dietician, CM and CSW. Request APS report be made.   Now plan for DC to SNF  Consultations:  Ophthalmology Dr.Abugo  Discharge Exam: Vitals:   09/12/17 0743 09/12/17 1223  BP: 114/83 (!) 123/91  Pulse: 94 89  Resp: 17 18  Temp: 97.9 F (36.6 C) 98.4 F (36.9 C)  SpO2: 98% 100%    General: alert, awake, pleasantly confused Cardiovascular: S1S2/RRR Respiratory: CTAB  Discharge Instructions   Discharge Instructions    Diet - low sodium heart healthy   Complete by:  As directed    Increase activity slowly   Complete by:  As directed      Allergies as of 09/12/2017   Not on File     Medication List    STOP taking these medications   hydrOXYzine 25 MG tablet Commonly known as:  ATARAX/VISTARIL   lisinopril-hydrochlorothiazide 20-25 MG tablet Commonly known as:  PRINZIDE,ZESTORETIC   meloxicam 15 MG tablet Commonly known as:  MOBIC     TAKE these medications   folic acid 1 MG tablet Commonly known as:  FOLVITE Take 1 tablet (1 mg total) by mouth daily. Start taking on:  09/13/2017   HYDROCORTISONE (TOPICAL) 1 % Gel Apply 1 application topically daily as needed (itching).   lovastatin 40 MG tablet Commonly known as:  MEVACOR Take 40 mg by mouth daily.  thiamine 100 MG tablet Take 1 tablet (100 mg total) by mouth daily. Start taking on:  09/13/2017      Not on File Contact information for after-discharge care    Destination    [facility] .   Service:  Skilled Nursing Contact information: [address] [place]  [phone]               The results of significant diagnostics from this hospitalization (including imaging, microbiology, ancillary and laboratory) are listed below for reference.    Significant Diagnostic Studies: Dg Chest 1 View  Result Date: 09/07/2017 CLINICAL DATA:  66 year old female status post fall last night. EXAM: CHEST 1 VIEW COMPARISON:  None. FINDINGS: The heart size and mediastinal contours are within normal limits. Both lungs are clear. The visualized skeletal structures are unremarkable. IMPRESSION: No active disease. Electronically Signed   By: Sande BrothersSerena  Chacko M.D.   On: 09/07/2017 16:42   Ct Head Wo Contrast  Result Date: 09/07/2017 CLINICAL DATA:  Patient fell last evening with bruising and swelling of the right eye. Laceration of the eyebrow. EXAM: CT HEAD WITHOUT CONTRAST CT MAXILLOFACIAL WITHOUT CONTRAST CT CERVICAL SPINE WITHOUT CONTRAST TECHNIQUE: Multidetector CT imaging of the head, cervical spine, and maxillofacial structures were performed using the standard protocol without intravenous contrast. Multiplanar CT image reconstructions of the cervical spine and maxillofacial structures were also generated. COMPARISON:  None. FINDINGS: CT HEAD FINDINGS Brain: Mild sulcal and moderate ventricular prominence consistent with superficial and central atrophy. Chronic appearing mild to moderate small vessel ischemic disease of periventricular white matter. No acute intracranial hemorrhage, midline shift or edema. No large vascular territory infarct. No extra-axial collections. Vascular: No hyperdense vessels. Moderate atherosclerosis of the cavernous internal carotids and both vertebral arteries. Skull: Intact zygomatic arches.  Intact bony calvarium. Other: None CT MAXILLOFACIAL FINDINGS Osseous: Acute right orbital floor fracture with fracture also involving the canal for the right maxillary nerve. 3 mm of caudal displacement of the fracture fragment is identified.  Herniation of orbital fat through the fracture is identified with slight herniation of the nerve as well. Associated medial wall fracture of the orbit is also noted. Intact zygomatic arches. Intact pterygoid plates. The temporomandibular joints are maintained. No mandibular fracture. Nasal bones are intact. Anterior maxillary process is intact. Orbits: Medial right orbital extraconal hemorrhage/edema measuring 2 mm in thickness, series 13, image 31. Small focus of intraconal hemorrhage also noted, series 13, image 34 with retrobulbar or mild edema. The globes appear intact. Sinuses: Hyperdense blood products and herniated retrobulbar fat is noted within the right maxillary sinus. Soft tissues: Right periorbital soft tissue swelling is noted. CT CERVICAL SPINE FINDINGS Alignment: Slight straightening of cervical lordosis. Minimal grade 1 anterolisthesis of C4 on C5. Intact craniocervical relationship. Osteoarthritis of the atlantodental interval. Skull base and vertebrae: No acute fracture. No primary bone lesion or focal pathologic process. Soft tissues and spinal canal: No prevertebral fluid or swelling. No visible canal hematoma. Disc levels: Mild disc space narrowing C4-5, C5-6 and C6-7. No focal disc herniations or significant canal stenosis. Facet arthropathy on the left with posterior marginal osteophytes at C4-5 contribute to left-sided neural foraminal encroachment. Upper chest: No acute abnormality Other: None IMPRESSION: 1. Acute right orbital floor and medial orbital wall fractures with fracture involving the canal for the right maxillary nerve seen along the medial orbital floor. There is 3 mm of caudal displacement of the orbital floor fracture with herniation of periorbital fat  and minimal herniation of the right maxillary nerve. 2. Associated with the right orbital fractures are a small amount of medial extraconal hemorrhage/fluid as well as small amount of lateral intraconal hemorrhage. Hyperdense  fluid in the right maxillary sinus would be in keeping with hemorrhage/blood products. Right periorbital soft tissue swelling is noted. 3. No acute intracranial abnormality. 4. Mild cervical spondylosis without acute cervical spine fracture. Electronically Signed   By: Tollie Ethavid  Kwon M.D.   On: 09/07/2017 16:46   Ct Cervical Spine Wo Contrast  Result Date: 09/07/2017 CLINICAL DATA:  Patient fell last evening with bruising and swelling of the right eye. Laceration of the eyebrow. EXAM: CT HEAD WITHOUT CONTRAST CT MAXILLOFACIAL WITHOUT CONTRAST CT CERVICAL SPINE WITHOUT CONTRAST TECHNIQUE: Multidetector CT imaging of the head, cervical spine, and maxillofacial structures were performed using the standard protocol without intravenous contrast. Multiplanar CT image reconstructions of the cervical spine and maxillofacial structures were also generated. COMPARISON:  None. FINDINGS: CT HEAD FINDINGS Brain: Mild sulcal and moderate ventricular prominence consistent with superficial and central atrophy. Chronic appearing mild to moderate small vessel ischemic disease of periventricular white matter. No acute intracranial hemorrhage, midline shift or edema. No large vascular territory infarct. No extra-axial collections. Vascular: No hyperdense vessels. Moderate atherosclerosis of the cavernous internal carotids and both vertebral arteries. Skull: Intact zygomatic arches.  Intact bony calvarium. Other: None CT MAXILLOFACIAL FINDINGS Osseous: Acute right orbital floor fracture with fracture also involving the canal for the right maxillary nerve. 3 mm of caudal displacement of the fracture fragment is identified. Herniation of orbital fat through the fracture is identified with slight herniation of the nerve as well. Associated medial wall fracture of the orbit is also noted. Intact zygomatic arches. Intact pterygoid plates. The temporomandibular joints are maintained. No mandibular fracture. Nasal bones are intact. Anterior  maxillary process is intact. Orbits: Medial right orbital extraconal hemorrhage/edema measuring 2 mm in thickness, series 13, image 31. Small focus of intraconal hemorrhage also noted, series 13, image 34 with retrobulbar or mild edema. The globes appear intact. Sinuses: Hyperdense blood products and herniated retrobulbar fat is noted within the right maxillary sinus. Soft tissues: Right periorbital soft tissue swelling is noted. CT CERVICAL SPINE FINDINGS Alignment: Slight straightening of cervical lordosis. Minimal grade 1 anterolisthesis of C4 on C5. Intact craniocervical relationship. Osteoarthritis of the atlantodental interval. Skull base and vertebrae: No acute fracture. No primary bone lesion or focal pathologic process. Soft tissues and spinal canal: No prevertebral fluid or swelling. No visible canal hematoma. Disc levels: Mild disc space narrowing C4-5, C5-6 and C6-7. No focal disc herniations or significant canal stenosis. Facet arthropathy on the left with posterior marginal osteophytes at C4-5 contribute to left-sided neural foraminal encroachment. Upper chest: No acute abnormality Other: None IMPRESSION: 1. Acute right orbital floor and medial orbital wall fractures with fracture involving the canal for the right maxillary nerve seen along the medial orbital floor. There is 3 mm of caudal displacement of the orbital floor fracture with herniation of periorbital fat and minimal herniation of the right maxillary nerve. 2. Associated with the right orbital fractures are a small amount of medial extraconal hemorrhage/fluid as well as small amount of lateral intraconal hemorrhage. Hyperdense fluid in the right maxillary sinus would be in keeping with hemorrhage/blood products. Right periorbital soft tissue swelling is noted. 3. No acute intracranial abnormality. 4. Mild cervical spondylosis without acute cervical spine fracture. Electronically Signed   By: Tollie Ethavid  Kwon M.D.   On: 09/07/2017 16:46  Ct  Pelvis Wo Contrast  Result Date: 09/07/2017 CLINICAL DATA:  Bilateral hip pain after fall yesterday. EXAM: CT PELVIS WITHOUT CONTRAST TECHNIQUE: Multidetector CT imaging of the pelvis was performed following the standard protocol without intravenous contrast. COMPARISON:  Radiograph earlier this day.  Right hip CT 02/20/2017 FINDINGS: Urinary Tract: Bladder physiologically distended. Distal ureters are decompressed. Bowel: No acute abnormality. Distal colonic diverticulosis. No bowel inflammation. Vascular/Lymphatic: Aorto bi-iliac atherosclerosis. No distal aneurysm. Small bilateral external iliac nodes. Reproductive: Unchanged 2.1 cm cyst in the right ovary. Uterine calcification likely fibroid. Left ovary is quiescent. Other:  No pelvic free fluid. Musculoskeletal: No acute fracture. Advanced arthropathy of bilateral hips with femoral head flattening and collapse, subchondral cystic change and scleroses, and bony fragmentation. Bilateral hip joint effusions. Findings in the right hip have progressed from prior CT. Pubic rami are intact. Sacroiliac joints and pubic symphysis are congruent. IMPRESSION: 1. No acute fracture of the pelvis or hips. 2. Bilateral hip AVN and advanced osteoarthritis, findings have progressed in the right hip from CT 6 months prior. Electronically Signed   By: Rubye Oaks M.D.   On: 09/07/2017 19:56   Dg Hip Unilat W Or Wo Pelvis 2-3 Views Right  Result Date: 09/07/2017 CLINICAL DATA:  66 year old female with history of trauma from a fall yesterday evening. Right-sided hip pain. EXAM: DG HIP (WITH OR WITHOUT PELVIS) 2-3V RIGHT COMPARISON:  02/20/2017. 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 Flattening of the left femoral head has significantly progressed compared to the prior study.  There is also extensive joint space narrowing, subchondral sclerosis, subchondral cyst formation and osteophyte formation in the hip joints bilaterally compatible with advanced bilateral osteoarthritis. Chronic superior and lateral subluxation of the right femoral head is similar to the prior study. No acute displaced fracture. IMPRESSION: 1. No acute abnormality of the bony pelvis or the right hip. 2. 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 Electronically Signed   By: Trudie Reed M.D.   On: 09/07/2017 16:52   Ct Maxillofacial Wo Contrast  Result Date: 09/07/2017 CLINICAL DATA:  Patient fell last evening with bruising and swelling of the right eye. Laceration of the eyebrow. EXAM: CT HEAD WITHOUT CONTRAST CT MAXILLOFACIAL WITHOUT CONTRAST CT CERVICAL SPINE WITHOUT CONTRAST TECHNIQUE: Multidetector CT imaging of the head, cervical spine, and maxillofacial structures were performed using the standard protocol without intravenous contrast. Multiplanar CT image reconstructions of the cervical spine and maxillofacial structures were also generated. COMPARISON:  None. FINDINGS: CT HEAD FINDINGS Brain: Mild sulcal and moderate ventricular prominence consistent with superficial and central atrophy. Chronic appearing mild to moderate small vessel ischemic disease of periventricular white matter. No acute intracranial hemorrhage, midline shift or edema. No large vascular territory infarct. No extra-axial collections. Vascular: No hyperdense vessels. Moderate atherosclerosis of the cavernous internal carotids and both vertebral arteries. Skull: Intact zygomatic arches.  Intact bony calvarium. Other: None CT MAXILLOFACIAL FINDINGS Osseous: Acute right orbital floor fracture with fracture also involving the canal for the right maxillary  nerve. 3 mm of caudal displacement of the fracture fragment is identified. Herniation of orbital fat through the fracture is identified with slight herniation of the nerve as well. Associated medial wall fracture  of the orbit is also noted. Intact zygomatic arches. Intact pterygoid plates. The temporomandibular joints are maintained. No mandibular fracture. Nasal bones are intact. Anterior maxillary process is intact. Orbits: Medial right orbital extraconal hemorrhage/edema measuring 2 mm in thickness, series 13, image 31. Small focus of intraconal hemorrhage also noted, series 13, image 34 with retrobulbar or mild edema. The globes appear intact. Sinuses: Hyperdense blood products and herniated retrobulbar fat is noted within the right maxillary sinus. Soft tissues: Right periorbital soft tissue swelling is noted. CT CERVICAL SPINE FINDINGS Alignment: Slight straightening of cervical lordosis. Minimal grade 1 anterolisthesis of C4 on C5. Intact craniocervical relationship. Osteoarthritis of the atlantodental interval. Skull base and vertebrae: No acute fracture. No primary bone lesion or focal pathologic process. Soft tissues and spinal canal: No prevertebral fluid or swelling. No visible canal hematoma. Disc levels: Mild disc space narrowing C4-5, C5-6 and C6-7. No focal disc herniations or significant canal stenosis. Facet arthropathy on the left with posterior marginal osteophytes at C4-5 contribute to left-sided neural foraminal encroachment. Upper chest: No acute abnormality Other: None IMPRESSION: 1. Acute right orbital floor and medial orbital wall fractures with fracture involving the canal for the right maxillary nerve seen along the medial orbital floor. There is 3 mm of caudal displacement of the orbital floor fracture with herniation of periorbital fat and minimal herniation of the right maxillary nerve. 2. Associated with the right orbital fractures are a small amount of medial extraconal  hemorrhage/fluid as well as small amount of lateral intraconal hemorrhage. Hyperdense fluid in the right maxillary sinus would be in keeping with hemorrhage/blood products. Right periorbital soft tissue swelling is noted. 3. No acute intracranial abnormality. 4. Mild cervical spondylosis without acute cervical spine fracture. Electronically Signed   By: Tollie Eth M.D.   On: 09/07/2017 16:46    Microbiology: No results found for this or any previous visit (from the past 240 hour(s)).   Labs: Basic Metabolic Panel: Recent Labs  Lab 09/08/17 1023  09/09/17 0524  09/09/17 1904  09/10/17 2147 09/11/17 0406 09/11/17 1826 09/11/17 2224 09/12/17 1204  NA 118*   < > 115*   < > 114*   < > 114* 118* 128* 130* 130*  K 3.4*   < > 4.1   < > 3.7   < > 3.5 3.5 3.9 3.8 4.1  CL 87*   < > 87*   < > 85*   < > 86* 89* 100* 101 101  CO2 22   < > 22   < > 22   < > 22 22 23 23 24   GLUCOSE 89   < > 98   < > 96   < > 99 88 138* 120* 104*  BUN 16   < > 9   < > 7   < > <5* <5* 5* 6 6  CREATININE 0.96   < > 0.82   < > 0.73   < > 0.72 0.67 0.84 0.83 0.87  CALCIUM 8.0*   < > 8.0*   < > 7.7*   < > 7.8* 8.1* 8.0* 8.1* 8.6*  MG 1.2*  --  1.5*  --  2.2  --   --  1.2* 2.1  --   --   PHOS  --   --  1.9*  --   --   --   --  3.5  --   --   --    < > = values in this interval not  displayed.   Liver Function Tests: No results for input(s): AST, ALT, ALKPHOS, BILITOT, PROT, ALBUMIN in the last 168 hours. No results for input(s): LIPASE, AMYLASE in the last 168 hours. No results for input(s): AMMONIA in the last 168 hours. CBC: Recent Labs  Lab 09/07/17 1720 09/07/17 2336 09/10/17 0243 09/11/17 0406 09/12/17 0234  WBC 7.5 6.6 7.7 7.1 6.9  NEUTROABS 6.1  --   --   --   --   HGB 10.8* 10.0* 9.6* 9.1* 8.9*  HCT 30.4* 28.0* 26.6* 25.8* 25.2*  MCV 76.2* 76.1* 75.6* 75.9* 78.5  PLT 298 252 260 277 275   Cardiac Enzymes: Recent Labs  Lab 09/07/17 1807  CKTOTAL 437*   BNP: BNP (last 3 results) No results for  input(s): BNP in the last 8760 hours.  ProBNP (last 3 results) No results for input(s): PROBNP in the last 8760 hours.  CBG: Recent Labs  Lab 09/08/17 0625 09/09/17 0611 09/11/17 0600 09/12/17 0615 09/12/17 0740  GLUCAP 98 80 85 101* 111*       Signed:  Zannie Cove MD.  [facility] 09/12/2017, 12:58 PM

## 2017-09-12 NOTE — Care Management Note (Signed)
Case Management Note Donn PieriniKristi Elnita Surprenant RN, BSN Unit 4E-Case Manager [phone]  Patient Details  Name: Norma Ayers MRN: [id number] Date of Birth: 09/07/1951  Subjective/Objective:   Pt found down at home by family admitted with hyponatremia                 Action/Plan: PTA pt lived at home alone- hx of cognitive delay - pt able to do basic ADLs. - hx of ETOH- CSW consulted for SNF placement- CM to follow.   Expected Discharge Date:  09/12/17               Expected Discharge Plan:  Skilled Nursing Facility  In-House Referral:  Clinical Social Work  Discharge planning Services  CM Consult  Post Acute Care Choice:  NA Choice offered to:  NA  DME Arranged:    DME Agency:     HH Arranged:    HH Agency:     Status of Service:  Completed, signed off  If discussed at [employer] Meetings, dates discussed:    Discharge Disposition: skilled facility   Additional Comments:  09/12/17- 1425- [place] Dodgen RN, CM- pt for d/c to SNF today- CSW following for placement needs.   Darrold SpanWebster, Sui Kasparek Hall, RN 09/12/2017, 2:37 PM

## 2017-09-13 ENCOUNTER — Non-Acute Institutional Stay (SKILLED_NURSING_FACILITY): Payer: Medicare Other | Admitting: Adult Health

## 2017-09-13 ENCOUNTER — Encounter: Payer: Self-pay | Admitting: Adult Health

## 2017-09-13 DIAGNOSIS — S0285XS Fracture of orbit, unspecified, sequela: Secondary | ICD-10-CM

## 2017-09-13 DIAGNOSIS — S0281XS Fracture of other specified skull and facial bones, right side, sequela: Secondary | ICD-10-CM

## 2017-09-13 DIAGNOSIS — M87052 Idiopathic aseptic necrosis of left femur: Secondary | ICD-10-CM | POA: Diagnosis not present

## 2017-09-13 DIAGNOSIS — E871 Hypo-osmolality and hyponatremia: Secondary | ICD-10-CM

## 2017-09-13 DIAGNOSIS — F1020 Alcohol dependence, uncomplicated: Secondary | ICD-10-CM

## 2017-09-13 DIAGNOSIS — F819 Developmental disorder of scholastic skills, unspecified: Secondary | ICD-10-CM

## 2017-09-13 DIAGNOSIS — K148 Other diseases of tongue: Secondary | ICD-10-CM

## 2017-09-13 DIAGNOSIS — M87051 Idiopathic aseptic necrosis of right femur: Secondary | ICD-10-CM

## 2017-09-13 NOTE — Progress Notes (Signed)
Location:   [facility] Room Number: 219 B Place of Service:  SNF (31)   CODE STATUS: Full Code  No Known Allergies  Chief Complaint  Patient presents with  . 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 She is intellectually challenged; has a long history of alcholism and has dementia. She is unable to participate in the hpi in the ros. The therapy staff reports that she has significant pain with right hip movement; there are no reports of change in appetite; no sleep disturbance last night.  She is here for short term rehab. She will require long term placement in either SNF or assisted living.   Past Medical History:  Diagnosis Date  . Alcoholism (HCC) 09/08/2017  . Avascular necrosis of bones of both hips (HCC) 09/08/2017  . Cognitive developmental delay 09/08/2017  . Hyperlipidemia   . Hypertension   . Right orbit fracture (HCC) 09/08/2017  . Tongue lesion 09/08/2017    Past Surgical History:  Procedure Laterality Date  . gum tumor     removed    Social History   Socioeconomic History  . Marital status: Widowed    Spouse name: Not on file  . Number of children: Not on file  . Years of education: Not on file  . Highest education level: Not on file  Social Needs  . Financial resource strain: Not on file  . Food insecurity - worry: Not on file  . Food insecurity - inability: Not on file  . Transportation needs - medical: Not on file  . Transportation needs - non-medical: Not on file  Occupational History  . Not on file  Tobacco Use  . Smoking status: Never Smoker  . Smokeless tobacco: Never Used  Substance and Sexual Activity  . Alcohol use: Yes    Comment: 2 times per week  . Drug use: No  . Sexual activity: Not on file  Other Topics Concern  . Not on file  Social History Narrative  . Not on file     Family History  Problem Relation Age of Onset  . Emphysema Mother   . Hypertension Sister   . Heart attack Brother       VITAL SIGNS BP 122/80   Pulse 91   Temp 98 F (36.7 C)   Resp 18   Ht 5\' 1"  (1.549 m)   Wt 158 lb 15.2 oz (72.1 kg)   SpO2 98%   BMI 30.03 kg/m   Outpatient Encounter Medications as of 09/13/2017  Medication Sig  . folic acid (FOLVITE) 1 MG tablet Take 1 tablet (1 mg total) by mouth daily.  Marland Kitchen HYDROCORTISONE, TOPICAL, 1 % GEL Apply 1 application topically daily as needed (itching).  Marland Kitchen lovastatin (MEVACOR) 40 MG tablet Take 40 mg by mouth daily.  Marland Kitchen thiamine 100 MG tablet Take 1 tablet (100 mg total) by mouth daily.   No facility-administered encounter medications on file as of 09/13/2017.      SIGNIFICANT DIAGNOSTIC EXAMS  TODAY:   09-07-17: chest x-ray: No active disease.   09-07-17: ct of head cervical spine; maxillofacial:  1. Acute right orbital floor and medial orbital wall fractures with fracture involving the canal for the right maxillary nerve seen along the medial orbital floor. There is 3 mm of caudal displacement of the orbital floor fracture with herniation of periorbital fat and  minimal herniation of the right maxillary nerve. 2. Associated with the right orbital fractures are a small amount of medial extraconal hemorrhage/fluid as well as small amount of lateral intraconal hemorrhage. Hyperdense fluid in the right maxillary sinus would be in keeping with hemorrhage/blood products. Right periorbital soft tissue swelling is noted. 3. No acute intracranial abnormality. 4. Mild cervical spondylosis without acute cervical spine fracture.  09-07-17: right hip x-ray:  1. No acute abnormality of the bony pelvis or the right hip. 2. Advanced degenerative changes in the hip joints bilaterally slightly progressive compared to the prior examination related to severe bilateral femoral head avascular necrosis and advanced bilateral hip joint  osteoarthritis.  09-07-17: ct of pelvis:  1. No acute fracture of the pelvis or hips. 2. Bilateral hip AVN and advanced osteoarthritis, findings have progressed in the right hip from CT 6 months prior.   LABS REVIEWED: TODAY:   09-07-17: wbc 7.5; hgb 10.8; hct 30.4; mcv 76.2; plt 298; glucose 105; bun 21; creat 1.18; k+ 3.2; na++ 116; ca 8.9 ck 437; tsh 1.378 09-09-17: glucose 98; bun 9; creat 0.82; k+ 4.1; na++ 115; ca 8.0; mag 1.5; phos 1.9; uric 4.2 09-10-17: wbc 7.7; hgb 9.6; hct 26.6; mcv 75.6 plt 260; glucose 121; bun <5; creat 0.69; k+ 3.4; na++ 112; ca 8.0 09-11-17: wbc 7.1; hgb 9.1; hct 25.8; mcv 75.9; plt 277; glucose 88; bun <5; creat 0.67; k+ 3.5; na++ 118; ca 8.441mag 1.2; phos 3.5  09-11-17: (repeat) glucose 120; bun 6; creat 0.83; k+ 3.8; na++ 130; ca 8.1 09-12-17: wbc 6.9; hgb 8.9; hct 25.2; mcv 78.5; plt 275; glucose 104; bun 6; creat 0.87; k+ 4.1; na++ 130; ca 8.6    Review of Systems  Unable to perform ROS: Other (intellectually challenged )    Physical Exam  Constitutional: No distress.  Frail   Eyes:  Fading bruises right side of face Right eye sclera red with clear drainage present   Neck: Normal range of motion. Neck supple. No thyromegaly present.  Cardiovascular: Normal rate, regular rhythm and intact distal pulses.  Murmur heard. 2/6  Pulmonary/Chest: Effort normal and breath sounds normal. No respiratory distress.  Abdominal: Soft. Bowel sounds are normal. She exhibits no distension. There is no tenderness.  Musculoskeletal: She exhibits no edema.  Able to move all extremities Has right hip crepitus and has pain with movement   Lymphadenopathy:    She has no cervical adenopathy.  Neurological: She is alert.  Skin: Skin is warm and dry. She is not diaphoretic.  Psychiatric: She has a normal mood and affect.     ASSESSMENT/ PLAN:  TODAY:   1. Avascular necrosis of both hips: will continue therapy as directed; her family states that she will need  a right hip replacement. Will begin a lidoderm patch to the right hip daily and will begin aleve 220 mg twice daily   2. Right orbit fracture: will have her follow up with Dr. Harvel QualeAbugo in the next 3 weeks. Will begin liquid tears to both eyes four times daily   3. Severe hyponatremia: her na++ level 130 upon discharge from hospital. Will continue to monitor her status.   4.  Cognitive delay with dementia: is currently not on medications; will not make changes at this time and will address as indicated  5. Dyslipidemia: stable will continue mevacor 20 mg daily   6. Alcoholism: is currently not consuming alcohol no signs of withdrawal present;will continue folic acid and thiamine  7. Leukoplakia tongue: patient  would not allow exam: will monitor    Will check cmp mag ck     MD is aware of resident's narcotic use and is in agreement with current plan of care. We will attempt to wean resident as apropriate   Synthia Innocenteborah Veroncia Jezek NP [facility]  Contact [phone] Monday through Friday 8am- 5pm  After hours call [phone]

## 2017-09-16 ENCOUNTER — Non-Acute Institutional Stay (SKILLED_NURSING_FACILITY): Payer: Medicare Other | Admitting: Internal Medicine

## 2017-09-16 ENCOUNTER — Encounter: Payer: Self-pay | Admitting: Internal Medicine

## 2017-09-16 DIAGNOSIS — R2681 Unsteadiness on feet: Secondary | ICD-10-CM

## 2017-09-16 DIAGNOSIS — F819 Developmental disorder of scholastic skills, unspecified: Secondary | ICD-10-CM | POA: Diagnosis not present

## 2017-09-16 DIAGNOSIS — S0281XS Fracture of other specified skull and facial bones, right side, sequela: Secondary | ICD-10-CM

## 2017-09-16 DIAGNOSIS — F1020 Alcohol dependence, uncomplicated: Secondary | ICD-10-CM

## 2017-09-16 DIAGNOSIS — M87052 Idiopathic aseptic necrosis of left femur: Secondary | ICD-10-CM | POA: Diagnosis not present

## 2017-09-16 DIAGNOSIS — S0285XS Fracture of orbit, unspecified, sequela: Secondary | ICD-10-CM

## 2017-09-16 DIAGNOSIS — D649 Anemia, unspecified: Secondary | ICD-10-CM | POA: Diagnosis not present

## 2017-09-16 DIAGNOSIS — M87051 Idiopathic aseptic necrosis of right femur: Secondary | ICD-10-CM

## 2017-09-16 NOTE — Progress Notes (Deleted)
Patient ID: Norma Ayers, female   DOB: July 08, 1951, 66 y.o.   MRN: [id number]     HISTORY AND PHYSICAL   DATE:  September 16, 2017  Location:   [facility] Room Number: 219 B Place of Service: SNF (31)   Extended Emergency Contact Information Primary Emergency Contact: Gabriel Cirri [place] Home Phone: [phone] Mobile Phone: [phone] Relation: Sister Secondary Emergency Contact: Bettye Boeck [place] Home Phone: [phone] Mobile Phone: [phone] Relation: Sister  Advanced Directive information Does Patient Have a Medical Advance Directive?: No, Would patient like information on creating a medical advance directive?: No - Patient declined  Chief Complaint  Patient presents with  . New Admit To SNF    Admission    HPI:  ***  Past Medical History:  Diagnosis Date  . Alcoholism (HCC) 09/08/2017  . Avascular necrosis of bones of both hips (HCC) 09/08/2017  . Cognitive developmental delay 09/08/2017  . Hyperlipidemia   . Hypertension   . Right orbit fracture (HCC) 09/08/2017  . Tongue lesion 09/08/2017    Past Surgical History:  Procedure Laterality Date  . gum tumor     removed    Patient Care Team: Gala Lewandowsky as PCP - General (Family Medicine)  Social History   Socioeconomic History  . Marital status: Widowed    Spouse name: Not on file  . Number of children: Not on file  . Years of education: Not on file  . Highest education level: Not on file  Social Needs  . Financial resource strain: Not on file  . Food insecurity - worry: Not on file  . Food insecurity - inability: Not on file  . Transportation needs - medical: Not on file  . Transportation needs - non-medical: Not on file  Occupational History  . Not on file  Tobacco Use  . Smoking status: Never Smoker  . Smokeless tobacco: Never Used  Substance and Sexual Activity  . Alcohol use: Yes    Comment: 2 times per week  .  Drug use: No  . Sexual activity: Not on file  Other Topics Concern  . Not on file  Social History Narrative  . Not on file     reports that  has never smoked. she has never used smokeless tobacco. She reports that she drinks alcohol. She reports that she does not use drugs.  Family History  Problem Relation Age of Onset  . Emphysema Mother   . Hypertension Sister   . Heart attack Brother    Family Status  Relation Name Status  . Mother  (Not Specified)  . Sister  (Not Specified)  . Brother  (Not Specified)    Immunization History  Administered Date(s) Administered  . Tdap 09/28/2014    No Known Allergies  Medications:   Medication List        Accurate as of 09/16/17  9:31 AM. Always use your most recent med list.          folic acid 1 MG tablet Commonly known as:  FOLVITE Take 1 tablet (1 mg total) by mouth daily.   Lidocaine 4 % Ptch   lovastatin 40 MG tablet Commonly known as:  MEVACOR   naproxen sodium 220 MG tablet Commonly known as:  ALEVE   thiamine 100 MG tablet Take 1 tablet (100 mg total) by mouth daily.       Review of Systems  Vitals:   09/16/17 0916  BP: 116/68  Pulse: 68  Resp: 16  Temp: (!) 97.3 F (36.3 C)  SpO2: 98%  Weight: 158 lb 15.2 oz (72.1 kg)  Height: 5\' 1"  (1.549 m)   Body mass index is 30.03 kg/m.  Physical Exam   Labs reviewed: Admission on 09/07/2017, Discharged on 09/12/2017  No results displayed because visit has over 200 results.      Dg Chest 1 View  Result Date: 09/07/2017 CLINICAL DATA:  66 year old female status post fall last night. EXAM: CHEST 1 VIEW COMPARISON:  None. FINDINGS: The heart size and mediastinal contours are within normal limits. Both lungs are clear. The visualized skeletal structures are unremarkable. IMPRESSION: No active disease. Electronically Signed   By: Sande BrothersSerena  Chacko M.D.   On: 09/07/2017 16:42   Ct Head Wo Contrast  Result Date: 09/07/2017 CLINICAL DATA:  Patient fell  last evening with bruising and swelling of the right eye. Laceration of the eyebrow. EXAM: CT HEAD WITHOUT CONTRAST CT MAXILLOFACIAL WITHOUT CONTRAST CT CERVICAL SPINE WITHOUT CONTRAST TECHNIQUE: Multidetector CT imaging of the head, cervical spine, and maxillofacial structures were performed using the standard protocol without intravenous contrast. Multiplanar CT image reconstructions of the cervical spine and maxillofacial structures were also generated. COMPARISON:  None. FINDINGS: CT HEAD FINDINGS Brain: Mild sulcal and moderate ventricular prominence consistent with superficial and central atrophy. Chronic appearing mild to moderate small vessel ischemic disease of periventricular white matter. No acute intracranial hemorrhage, midline shift or edema. No large vascular territory infarct. No extra-axial collections. Vascular: No hyperdense vessels. Moderate atherosclerosis of the cavernous internal carotids and both vertebral arteries. Skull: Intact zygomatic arches.  Intact bony calvarium. Other: None CT MAXILLOFACIAL FINDINGS Osseous: Acute right orbital floor fracture with fracture also involving the canal for the right maxillary nerve. 3 mm of caudal displacement of the fracture fragment is identified. Herniation of orbital fat through the fracture is identified with slight herniation of the nerve as well. Associated medial wall fracture of the orbit is also noted. Intact zygomatic arches. Intact pterygoid plates. The temporomandibular joints are maintained. No mandibular fracture. Nasal bones are intact. Anterior maxillary process is intact. Orbits: Medial right orbital extraconal hemorrhage/edema measuring 2 mm in thickness, series 13, image 31. Small focus of intraconal hemorrhage also noted, series 13, image 34 with retrobulbar or mild edema. The globes appear intact. Sinuses: Hyperdense blood products and herniated retrobulbar fat is noted within the right maxillary sinus. Soft tissues: Right  periorbital soft tissue swelling is noted. CT CERVICAL SPINE FINDINGS Alignment: Slight straightening of cervical lordosis. Minimal grade 1 anterolisthesis of C4 on C5. Intact craniocervical relationship. Osteoarthritis of the atlantodental interval. Skull base and vertebrae: No acute fracture. No primary bone lesion or focal pathologic process. Soft tissues and spinal canal: No prevertebral fluid or swelling. No visible canal hematoma. Disc levels: Mild disc space narrowing C4-5, C5-6 and C6-7. No focal disc herniations or significant canal stenosis. Facet arthropathy on the left with posterior marginal osteophytes at C4-5 contribute to left-sided neural foraminal encroachment. Upper chest: No acute abnormality Other: None IMPRESSION: 1. Acute right orbital floor and medial orbital wall fractures with fracture involving the canal for the right maxillary nerve seen along the medial orbital floor. There is 3 mm of caudal displacement of the orbital floor fracture with herniation of periorbital fat and minimal herniation of the right maxillary nerve. 2. Associated with the right orbital fractures are a small amount of medial extraconal hemorrhage/fluid as well as small amount of lateral intraconal hemorrhage. Hyperdense fluid in the  right maxillary sinus would be in keeping with hemorrhage/blood products. Right periorbital soft tissue swelling is noted. 3. No acute intracranial abnormality. 4. Mild cervical spondylosis without acute cervical spine fracture. Electronically Signed   By: Tollie Ethavid  Kwon M.D.   On: 09/07/2017 16:46   Ct Cervical Spine Wo Contrast  Result Date: 09/07/2017 CLINICAL DATA:  Patient fell last evening with bruising and swelling of the right eye. Laceration of the eyebrow. EXAM: CT HEAD WITHOUT CONTRAST CT MAXILLOFACIAL WITHOUT CONTRAST CT CERVICAL SPINE WITHOUT CONTRAST TECHNIQUE: Multidetector CT imaging of the head, cervical spine, and maxillofacial structures were performed using the  standard protocol without intravenous contrast. Multiplanar CT image reconstructions of the cervical spine and maxillofacial structures were also generated. COMPARISON:  None. FINDINGS: CT HEAD FINDINGS Brain: Mild sulcal and moderate ventricular prominence consistent with superficial and central atrophy. Chronic appearing mild to moderate small vessel ischemic disease of periventricular white matter. No acute intracranial hemorrhage, midline shift or edema. No large vascular territory infarct. No extra-axial collections. Vascular: No hyperdense vessels. Moderate atherosclerosis of the cavernous internal carotids and both vertebral arteries. Skull: Intact zygomatic arches.  Intact bony calvarium. Other: None CT MAXILLOFACIAL FINDINGS Osseous: Acute right orbital floor fracture with fracture also involving the canal for the right maxillary nerve. 3 mm of caudal displacement of the fracture fragment is identified. Herniation of orbital fat through the fracture is identified with slight herniation of the nerve as well. Associated medial wall fracture of the orbit is also noted. Intact zygomatic arches. Intact pterygoid plates. The temporomandibular joints are maintained. No mandibular fracture. Nasal bones are intact. Anterior maxillary process is intact. Orbits: Medial right orbital extraconal hemorrhage/edema measuring 2 mm in thickness, series 13, image 31. Small focus of intraconal hemorrhage also noted, series 13, image 34 with retrobulbar or mild edema. The globes appear intact. Sinuses: Hyperdense blood products and herniated retrobulbar fat is noted within the right maxillary sinus. Soft tissues: Right periorbital soft tissue swelling is noted. CT CERVICAL SPINE FINDINGS Alignment: Slight straightening of cervical lordosis. Minimal grade 1 anterolisthesis of C4 on C5. Intact craniocervical relationship. Osteoarthritis of the atlantodental interval. Skull base and vertebrae: No acute fracture. No primary bone  lesion or focal pathologic process. Soft tissues and spinal canal: No prevertebral fluid or swelling. No visible canal hematoma. Disc levels: Mild disc space narrowing C4-5, C5-6 and C6-7. No focal disc herniations or significant canal stenosis. Facet arthropathy on the left with posterior marginal osteophytes at C4-5 contribute to left-sided neural foraminal encroachment. Upper chest: No acute abnormality Other: None IMPRESSION: 1. Acute right orbital floor and medial orbital wall fractures with fracture involving the canal for the right maxillary nerve seen along the medial orbital floor. There is 3 mm of caudal displacement of the orbital floor fracture with herniation of periorbital fat and minimal herniation of the right maxillary nerve. 2. Associated with the right orbital fractures are a small amount of medial extraconal hemorrhage/fluid as well as small amount of lateral intraconal hemorrhage. Hyperdense fluid in the right maxillary sinus would be in keeping with hemorrhage/blood products. Right periorbital soft tissue swelling is noted. 3. No acute intracranial abnormality. 4. Mild cervical spondylosis without acute cervical spine fracture. Electronically Signed   By: Tollie Ethavid  Kwon M.D.   On: 09/07/2017 16:46   Ct Pelvis Wo Contrast  Result Date: 09/07/2017 CLINICAL DATA:  Bilateral hip pain after fall yesterday. EXAM: CT PELVIS WITHOUT CONTRAST TECHNIQUE: Multidetector CT imaging of the pelvis was performed following the standard protocol without  intravenous contrast. COMPARISON:  Radiograph earlier this day.  Right hip CT 02/20/2017 FINDINGS: Urinary Tract: Bladder physiologically distended. Distal ureters are decompressed. Bowel: No acute abnormality. Distal colonic diverticulosis. No bowel inflammation. Vascular/Lymphatic: Aorto bi-iliac atherosclerosis. No distal aneurysm. Small bilateral external iliac nodes. Reproductive: Unchanged 2.1 cm cyst in the right ovary. Uterine calcification likely  fibroid. Left ovary is quiescent. Other:  No pelvic free fluid. Musculoskeletal: No acute fracture. Advanced arthropathy of bilateral hips with femoral head flattening and collapse, subchondral cystic change and scleroses, and bony fragmentation. Bilateral hip joint effusions. Findings in the right hip have progressed from prior CT. Pubic rami are intact. Sacroiliac joints and pubic symphysis are congruent. IMPRESSION: 1. No acute fracture of the pelvis or hips. 2. Bilateral hip AVN and advanced osteoarthritis, findings have progressed in the right hip from CT 6 months prior. Electronically Signed   By: Rubye Oaks M.D.   On: 09/07/2017 19:56   Dg Hip Unilat W Or Wo Pelvis 2-3 Views Right  Result Date: 09/07/2017 CLINICAL DATA:  66 year old female with history of trauma from a fall yesterday evening. Right-sided hip pain. EXAM: DG HIP (WITH OR WITHOUT PELVIS) 2-3V RIGHT COMPARISON:  02/20/2017. 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 Flattening of the left femoral head has significantly progressed compared to the prior study. There is also extensive joint space narrowing, subchondral sclerosis, subchondral cyst formation and osteophyte formation in the hip joints bilaterally compatible with advanced bilateral osteoarthritis. Chronic superior and lateral subluxation of the right femoral head is similar to the prior study. No acute displaced fracture. IMPRESSION: 1. No acute abnormality of the bony pelvis or the right hip. 2. 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 Electronically Signed   By: Trudie Reed M.D.   On: 09/07/2017 16:52    Ct Maxillofacial Wo Contrast  Result Date: 09/07/2017 CLINICAL DATA:  Patient fell last evening with bruising and swelling of the right eye. Laceration of the eyebrow. EXAM: CT HEAD WITHOUT CONTRAST CT MAXILLOFACIAL WITHOUT CONTRAST CT CERVICAL SPINE WITHOUT CONTRAST TECHNIQUE: Multidetector CT imaging of the head, cervical spine, and maxillofacial structures were performed using the standard protocol without intravenous contrast. Multiplanar CT image reconstructions of the cervical spine and maxillofacial structures were also generated. COMPARISON:  None. FINDINGS: CT HEAD FINDINGS Brain: Mild sulcal and moderate ventricular prominence consistent with superficial and central atrophy. Chronic appearing mild to moderate small vessel ischemic disease of periventricular white matter. No acute intracranial hemorrhage, midline shift or edema. No large vascular territory infarct. No extra-axial collections. Vascular: No hyperdense vessels. Moderate atherosclerosis of the cavernous internal carotids and both vertebral arteries. Skull: Intact zygomatic arches.  Intact bony calvarium. Other: None CT MAXILLOFACIAL FINDINGS Osseous: Acute right orbital floor fracture with fracture also involving the canal for the right maxillary nerve. 3 mm of caudal displacement of the fracture fragment is identified. Herniation of orbital fat through the fracture is identified with slight herniation of the nerve as well. Associated medial wall fracture of the orbit is also noted. Intact zygomatic arches. Intact pterygoid plates. The temporomandibular joints are maintained. No mandibular fracture. Nasal bones are intact. Anterior maxillary process is intact. Orbits: Medial right orbital extraconal hemorrhage/edema measuring 2  mm in thickness, series 13, image 31. Small focus of intraconal hemorrhage also noted, series 13, image 34 with retrobulbar or mild edema. The globes appear intact. Sinuses: Hyperdense blood products and herniated  retrobulbar fat is noted within the right maxillary sinus. Soft tissues: Right periorbital soft tissue swelling is noted. CT CERVICAL SPINE FINDINGS Alignment: Slight straightening of cervical lordosis. Minimal grade 1 anterolisthesis of C4 on C5. Intact craniocervical relationship. Osteoarthritis of the atlantodental interval. Skull base and vertebrae: No acute fracture. No primary bone lesion or focal pathologic process. Soft tissues and spinal canal: No prevertebral fluid or swelling. No visible canal hematoma. Disc levels: Mild disc space narrowing C4-5, C5-6 and C6-7. No focal disc herniations or significant canal stenosis. Facet arthropathy on the left with posterior marginal osteophytes at C4-5 contribute to left-sided neural foraminal encroachment. Upper chest: No acute abnormality Other: None IMPRESSION: 1. Acute right orbital floor and medial orbital wall fractures with fracture involving the canal for the right maxillary nerve seen along the medial orbital floor. There is 3 mm of caudal displacement of the orbital floor fracture with herniation of periorbital fat and minimal herniation of the right maxillary nerve. 2. Associated with the right orbital fractures are a small amount of medial extraconal hemorrhage/fluid as well as small amount of lateral intraconal hemorrhage. Hyperdense fluid in the right maxillary sinus would be in keeping with hemorrhage/blood products. Right periorbital soft tissue swelling is noted. 3. No acute intracranial abnormality. 4. Mild cervical spondylosis without acute cervical spine fracture. Electronically Signed   By: Tollie Eth M.D.   On: 09/07/2017 16:46     Assessment/Plan    Monica S. Ancil Linsey  [facility] [address] [place], [place] [address] [phone] Cell (Monday-Friday 8 AM - 5 PM) [phone] After 5 PM and follow prompts

## 2017-09-16 NOTE — Progress Notes (Signed)
Patient ID: Norma Ayers, female   DOB: [birth year], 66 y.o.   MRN: [id number]   Provider:  DR Elmon Kirschner Location:  [facility] Room Number: 219 [phone] Place of Service:  SNF (31)  PCP: Richmond Campbell., PA-C Patient Care Team: Gala Lewandowsky as PCP - General (Family Medicine)  Extended Emergency Contact Information Primary Emergency Contact: Gabriel Cirri [place] Home Phone: [phone] Mobile Phone: [phone] Relation: Sister Secondary Emergency Contact: Bettye Boeck [place] Home Phone: [phone] Mobile Phone: [phone] Relation: Sister  Code Status: Full Code Goals of Care: Advanced Directive information Advanced Directives 09/16/2017  Does Patient Have a Medical Advance Directive? No  Would patient like information on creating a medical advance directive? No - Patient declined      Chief Complaint  Patient presents with  . New Admit To SNF    Admission    HPI: Patient is a 66 y.o. female seen today for admission into SNF following hospital stay for hyponatremia, cognitive changes with suspicion for dementia, right orbital fx, Etoh abuse, tongue lesion (leukoplakia), elevated CK, b/l hip avascular necrosis. She had a fall at home -->ED. Her w/u was (+) for right orbital fx and profound hyponatremia. She was seen by ENT and ophthamology. APS reported. Hgb 8.9; CK 437; Na dropped 112-->130 at d/c. She presents to SNF for short term rehab with potential for long tern care.   Today she reports no concerns. Nursing c/a weight (apears to be down >20 lbs since hospital d/c). BPs 110s/60s. No falls since admission. Appetite reduced. Sleeps ok. She is a poor historian due to memory loss. Hx obtained from chart.  Avascular necrosis of b/l hips - tolerating tx. Family states that she will need a right hip replacement. Pain stable on lidoderm patch to the right hip daily; aleve 220 mg twice daily    Cognitive impairment - suspicious for dementia. She is currently not on medications.   Dyslipidemia - takes mevacor 20 mg daily; no myalgias  Etoh abuse - currently not consuming alcohol with no signs of withdrawal; takes folic acid and thiamine. B12 level 406  Past Medical History:  Diagnosis Date  . Alcoholism (HCC) 09/08/2017  . Avascular necrosis of bones of both hips (HCC) 09/08/2017  . Cognitive developmental delay 09/08/2017  . Hyperlipidemia   . Hypertension   . Right orbit fracture (HCC) 09/08/2017  . Tongue lesion 09/08/2017   Past Surgical History:  Procedure Laterality Date  . gum tumor     removed    reports that  has never smoked. she has never used smokeless tobacco. She reports that she drinks alcohol. She reports that she does not use drugs. Social History   Socioeconomic History  . Marital status: Widowed    Spouse name: Not on file  . Number of children: Not on file  . Years of education: Not on file  . Highest education level: Not on file  Social Needs  . Financial resource strain: Not on file  . Food insecurity - worry: Not on file  . Food insecurity - inability: Not on file  . Transportation needs - medical: Not on file  . Transportation needs - non-medical: Not on file  Occupational History  . Not on file  Tobacco Use  . Smoking status: Never Smoker  . Smokeless tobacco: Never Used  Substance and Sexual Activity  . Alcohol use: Yes    Comment: 2 times per week  .  Drug use: No  . Sexual activity: Not on file  Other Topics Concern  . Not on file  Social History Narrative  . Not on file    Family History  Problem Relation Age of Onset  . Emphysema Mother   . Hypertension Sister   . Heart attack Brother     Health Maintenance  Topic Date Due  . INFLUENZA VACCINE  10/17/2017 (Originally 05/15/2017)  . MAMMOGRAM  10/17/2017 (Originally 06/18/2014)  . DEXA SCAN  10/17/2017 (Originally 04/18/2014)  . COLONOSCOPY  10/17/2017 (Originally  04/19/1999)  . PNA vac Low Risk Adult (1 of 2 - PCV13) 10/17/2017 (Originally 04/18/2014)  . Hepatitis C Screening  09/16/2018 (Originally [birth year])  . TETANUS/TDAP  09/28/2024    No Known Allergies  Outpatient Encounter Medications as of 09/16/2017  Medication Sig  . folic acid (FOLVITE) 1 MG tablet Take 1 tablet (1 mg total) by mouth daily.  . Lidocaine 4 % PTCH Apply to right hip topically two times daily  . lovastatin (MEVACOR) 40 MG tablet Take 40 mg by mouth daily.  . naproxen sodium (ALEVE) 220 MG tablet Take 220 mg by mouth 2 (two) times daily.  Marland Kitchen thiamine 100 MG tablet Take 1 tablet (100 mg total) by mouth daily.  . [DISCONTINUED] HYDROCORTISONE, TOPICAL, 1 % GEL Apply 1 application topically daily as needed (itching).   No facility-administered encounter medications on file as of 09/16/2017.     Review of Systems  Unable to perform ROS: Psychiatric disorder (mentally challenged)    Vitals:   09/16/17 0916  BP: 116/68  Pulse: 68  Resp: 16  Temp: (!) 97.3 F (36.3 C)  SpO2: 98%  Weight: 158 lb 15.2 oz (72.1 kg) -->weight at SNF 130.5 lbs  Height: 5\' 1"  (1.549 m)   Body mass index is 30.03 kg/m. Physical Exam  Constitutional: She appears well-developed.  Frail appearing in NAD,sitting in w/c  HENT:  Mouth/Throat: No oropharyngeal exudate.  MMdry. No oral thrush. No gross abnormality of tongue (pt refused to protrude tongue for closer examination)  Eyes: Pupils are equal, round, and reactive to light. No scleral icterus.  Neck: Neck supple. Carotid bruit is not present. No tracheal deviation present.  Cardiovascular: Normal rate, regular rhythm and intact distal pulses. Exam reveals no gallop and no friction rub.  Murmur (1/6 SEM) heard. +1 pitting LE edema b/l. No calf TTP  Pulmonary/Chest: Effort normal and breath sounds normal. No stridor. No respiratory distress. She has no wheezes. She has no rales.  Abdominal: Soft. Normal appearance and bowel sounds are normal.  She exhibits no distension and no mass. There is no hepatomegaly. There is no tenderness. There is no rigidity, no rebound and no guarding. No hernia.  Musculoskeletal: She exhibits edema.  Lymphadenopathy:    She has no cervical adenopathy.  Neurological: She is alert. She displays tremor (resting). Gait (unsteady and antalgic) abnormal.  Skin: Skin is warm and dry. No rash noted.  Psychiatric: She has a normal mood and affect. Her behavior is normal.    Labs reviewed: Basic Metabolic Panel: Recent Labs    09/09/17 0524  09/09/17 1904  09/11/17 0406 09/11/17 1826 09/11/17 2224 09/12/17 1204  NA 115*   < > 114*   < > 118* 128* 130* 130*  K 4.1   < > 3.7   < > 3.5 3.9 3.8 4.1  CL 87*   < > 85*   < > 89* 100* 101 101  CO2 22   < >  22   < > 22 23 23 24   GLUCOSE 98   < > 96   < > 88 138* 120* 104*  BUN 9   < > 7   < > <5* 5* 6 6  CREATININE 0.82   < > 0.73   < > 0.67 0.84 0.83 0.87  CALCIUM 8.0*   < > 7.7*   < > 8.1* 8.0* 8.1* 8.6*  MG 1.5*  --  2.2  --  1.2* 2.1  --   --   PHOS 1.9*  --   --   --  3.5  --   --   --    < > = values in this interval not displayed.   Liver Function Tests: No results for input(s): AST, ALT, ALKPHOS, BILITOT, PROT, ALBUMIN in the last 8760 hours. No results for input(s): LIPASE, AMYLASE in the last 8760 hours. No results for input(s): AMMONIA in the last 8760 hours. CBC: Recent Labs    02/20/17 1853 09/07/17 1720  09/10/17 0243 09/11/17 0406 09/12/17 0234  WBC 5.5 7.5   < > 7.7 7.1 6.9  NEUTROABS 2.7 6.1  --   --   --   --   HGB 10.5* 10.8*   < > 9.6* 9.1* 8.9*  HCT 31.9* 30.4*   < > 26.6* 25.8* 25.2*  MCV 82.9 76.2*   < > 75.6* 75.9* 78.5  PLT 290 298   < > 260 277 275   < > = values in this interval not displayed.   Cardiac Enzymes: Recent Labs    09/07/17 1807  CKTOTAL 437*   BNP: Invalid input(s): POCBNP No results found for: HGBA1C Lab Results  Component Value Date   TSH 1.378 09/07/2017   No results found for:  VITAMINB12 No results found for: FOLATE No results found for: IRON, TIBC, FERRITIN  Imaging and Procedures obtained prior to SNF admission: Dg Chest 1 View  Result Date: 09/07/2017 CLINICAL DATA:  66 year old female status post fall last night. EXAM: CHEST 1 VIEW COMPARISON:  None. FINDINGS: The heart size and mediastinal contours are within normal limits. Both lungs are clear. The visualized skeletal structures are unremarkable. IMPRESSION: No active disease. Electronically Signed   By: Sande Brothers M.D.   On: 09/07/2017 16:42   Ct Head Wo Contrast  Result Date: 09/07/2017 CLINICAL DATA:  Patient fell last evening with bruising and swelling of the right eye. Laceration of the eyebrow. EXAM: CT HEAD WITHOUT CONTRAST CT MAXILLOFACIAL WITHOUT CONTRAST CT CERVICAL SPINE WITHOUT CONTRAST TECHNIQUE: Multidetector CT imaging of the head, cervical spine, and maxillofacial structures were performed using the standard protocol without intravenous contrast. Multiplanar CT image reconstructions of the cervical spine and maxillofacial structures were also generated. COMPARISON:  None. FINDINGS: CT HEAD FINDINGS Brain: Mild sulcal and moderate ventricular prominence consistent with superficial and central atrophy. Chronic appearing mild to moderate small vessel ischemic disease of periventricular white matter. No acute intracranial hemorrhage, midline shift or edema. No large vascular territory infarct. No extra-axial collections. Vascular: No hyperdense vessels. Moderate atherosclerosis of the cavernous internal carotids and both vertebral arteries. Skull: Intact zygomatic arches.  Intact bony calvarium. Other: None CT MAXILLOFACIAL FINDINGS Osseous: Acute right orbital floor fracture with fracture also involving the canal for the right maxillary nerve. 3 mm of caudal displacement of the fracture fragment is identified. Herniation of orbital fat through the fracture is identified with slight herniation of the  nerve as well. Associated medial wall fracture of the  orbit is also noted. Intact zygomatic arches. Intact pterygoid plates. The temporomandibular joints are maintained. No mandibular fracture. Nasal bones are intact. Anterior maxillary process is intact. Orbits: Medial right orbital extraconal hemorrhage/edema measuring 2 mm in thickness, series 13, image 31. Small focus of intraconal hemorrhage also noted, series 13, image 34 with retrobulbar or mild edema. The globes appear intact. Sinuses: Hyperdense blood products and herniated retrobulbar fat is noted within the right maxillary sinus. Soft tissues: Right periorbital soft tissue swelling is noted. CT CERVICAL SPINE FINDINGS Alignment: Slight straightening of cervical lordosis. Minimal grade 1 anterolisthesis of C4 on C5. Intact craniocervical relationship. Osteoarthritis of the atlantodental interval. Skull base and vertebrae: No acute fracture. No primary bone lesion or focal pathologic process. Soft tissues and spinal canal: No prevertebral fluid or swelling. No visible canal hematoma. Disc levels: Mild disc space narrowing C4-5, C5-6 and C6-7. No focal disc herniations or significant canal stenosis. Facet arthropathy on the left with posterior marginal osteophytes at C4-5 contribute to left-sided neural foraminal encroachment. Upper chest: No acute abnormality Other: None IMPRESSION: 1. Acute right orbital floor and medial orbital wall fractures with fracture involving the canal for the right maxillary nerve seen along the medial orbital floor. There is 3 mm of caudal displacement of the orbital floor fracture with herniation of periorbital fat and minimal herniation of the right maxillary nerve. 2. Associated with the right orbital fractures are a small amount of medial extraconal hemorrhage/fluid as well as small amount of lateral intraconal hemorrhage. Hyperdense fluid in the right maxillary sinus would be in keeping with hemorrhage/blood products. Right  periorbital soft tissue swelling is noted. 3. No acute intracranial abnormality. 4. Mild cervical spondylosis without acute cervical spine fracture. Electronically Signed   By: Tollie Eth M.D.   On: 09/07/2017 16:46   Ct Cervical Spine Wo Contrast  Result Date: 09/07/2017 CLINICAL DATA:  Patient fell last evening with bruising and swelling of the right eye. Laceration of the eyebrow. EXAM: CT HEAD WITHOUT CONTRAST CT MAXILLOFACIAL WITHOUT CONTRAST CT CERVICAL SPINE WITHOUT CONTRAST TECHNIQUE: Multidetector CT imaging of the head, cervical spine, and maxillofacial structures were performed using the standard protocol without intravenous contrast. Multiplanar CT image reconstructions of the cervical spine and maxillofacial structures were also generated. COMPARISON:  None. FINDINGS: CT HEAD FINDINGS Brain: Mild sulcal and moderate ventricular prominence consistent with superficial and central atrophy. Chronic appearing mild to moderate small vessel ischemic disease of periventricular white matter. No acute intracranial hemorrhage, midline shift or edema. No large vascular territory infarct. No extra-axial collections. Vascular: No hyperdense vessels. Moderate atherosclerosis of the cavernous internal carotids and both vertebral arteries. Skull: Intact zygomatic arches.  Intact bony calvarium. Other: None CT MAXILLOFACIAL FINDINGS Osseous: Acute right orbital floor fracture with fracture also involving the canal for the right maxillary nerve. 3 mm of caudal displacement of the fracture fragment is identified. Herniation of orbital fat through the fracture is identified with slight herniation of the nerve as well. Associated medial wall fracture of the orbit is also noted. Intact zygomatic arches. Intact pterygoid plates. The temporomandibular joints are maintained. No mandibular fracture. Nasal bones are intact. Anterior maxillary process is intact. Orbits: Medial right orbital extraconal hemorrhage/edema  measuring 2 mm in thickness, series 13, image 31. Small focus of intraconal hemorrhage also noted, series 13, image 34 with retrobulbar or mild edema. The globes appear intact. Sinuses: Hyperdense blood products and herniated retrobulbar fat is noted within the right maxillary sinus. Soft tissues: Right periorbital soft tissue  swelling is noted. CT CERVICAL SPINE FINDINGS Alignment: Slight straightening of cervical lordosis. Minimal grade 1 anterolisthesis of C4 on C5. Intact craniocervical relationship. Osteoarthritis of the atlantodental interval. Skull base and vertebrae: No acute fracture. No primary bone lesion or focal pathologic process. Soft tissues and spinal canal: No prevertebral fluid or swelling. No visible canal hematoma. Disc levels: Mild disc space narrowing C4-5, C5-6 and C6-7. No focal disc herniations or significant canal stenosis. Facet arthropathy on the left with posterior marginal osteophytes at C4-5 contribute to left-sided neural foraminal encroachment. Upper chest: No acute abnormality Other: None IMPRESSION: 1. Acute right orbital floor and medial orbital wall fractures with fracture involving the canal for the right maxillary nerve seen along the medial orbital floor. There is 3 mm of caudal displacement of the orbital floor fracture with herniation of periorbital fat and minimal herniation of the right maxillary nerve. 2. Associated with the right orbital fractures are a small amount of medial extraconal hemorrhage/fluid as well as small amount of lateral intraconal hemorrhage. Hyperdense fluid in the right maxillary sinus would be in keeping with hemorrhage/blood products. Right periorbital soft tissue swelling is noted. 3. No acute intracranial abnormality. 4. Mild cervical spondylosis without acute cervical spine fracture. Electronically Signed   By: Tollie Ethavid  Kwon M.D.   On: 09/07/2017 16:46   Ct Pelvis Wo Contrast  Result Date: 09/07/2017 CLINICAL DATA:  Bilateral hip pain after  fall yesterday. EXAM: CT PELVIS WITHOUT CONTRAST TECHNIQUE: Multidetector CT imaging of the pelvis was performed following the standard protocol without intravenous contrast. COMPARISON:  Radiograph earlier this day.  Right hip CT 02/20/2017 FINDINGS: Urinary Tract: Bladder physiologically distended. Distal ureters are decompressed. Bowel: No acute abnormality. Distal colonic diverticulosis. No bowel inflammation. Vascular/Lymphatic: Aorto bi-iliac atherosclerosis. No distal aneurysm. Small bilateral external iliac nodes. Reproductive: Unchanged 2.1 cm cyst in the right ovary. Uterine calcification likely fibroid. Left ovary is quiescent. Other:  No pelvic free fluid. Musculoskeletal: No acute fracture. Advanced arthropathy of bilateral hips with femoral head flattening and collapse, subchondral cystic change and scleroses, and bony fragmentation. Bilateral hip joint effusions. Findings in the right hip have progressed from prior CT. Pubic rami are intact. Sacroiliac joints and pubic symphysis are congruent. IMPRESSION: 1. No acute fracture of the pelvis or hips. 2. Bilateral hip AVN and advanced osteoarthritis, findings have progressed in the right hip from CT 6 months prior. Electronically Signed   By: Rubye OaksMelanie  Ehinger M.D.   On: 09/07/2017 19:56   Dg Hip Unilat W Or Wo Pelvis 2-3 Views Right  Result Date: 09/07/2017 CLINICAL DATA:  66 year old female with history of trauma from a fall yesterday evening. Right-sided hip pain. EXAM: DG HIP (WITH OR WITHOUT PELVIS) 2-3V RIGHT COMPARISON:  02/20/2017. 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 Flattening of the left femoral head has significantly progressed compared to the prior study. There is also extensive joint space narrowing, subchondral sclerosis, subchondral cyst  formation and osteophyte formation in the hip joints bilaterally compatible with advanced bilateral osteoarthritis. Chronic superior and lateral subluxation of the right femoral head is similar to the prior study. No acute displaced fracture. IMPRESSION: 1. No acute abnormality of the bony pelvis or the right hip. 2. Advanced degenerative changes in the hip joints bilaterally slightly progressive compared to the prior examination related to severe bilateral femoral head  avascular necrosis and advanced bilateral hip joint osteoarthritis. Electronically Signed   By: Trudie Reedaniel  Entrikin M.D.   On: 09/07/2017 16:52   Ct Maxillofacial Wo Contrast  Result Date: 09/07/2017 CLINICAL DATA:  Patient fell last evening with bruising and swelling of the right eye. Laceration of the eyebrow. EXAM: CT HEAD WITHOUT CONTRAST CT MAXILLOFACIAL WITHOUT CONTRAST CT CERVICAL SPINE WITHOUT CONTRAST TECHNIQUE: Multidetector CT imaging of the head, cervical spine, and maxillofacial structures were performed using the standard protocol without intravenous contrast. Multiplanar CT image reconstructions of the cervical spine and maxillofacial structures were also generated. COMPARISON:  None. FINDINGS: CT HEAD FINDINGS Brain: Mild sulcal and moderate ventricular prominence consistent with superficial and central atrophy. Chronic appearing mild to moderate small vessel ischemic disease of periventricular white matter. No acute intracranial hemorrhage, midline shift or edema. No large vascular territory infarct. No extra-axial collections. Vascular: No hyperdense vessels. Moderate atherosclerosis of the cavernous internal carotids and both vertebral arteries. Skull: Intact zygomatic arches.  Intact bony calvarium. Other: None CT MAXILLOFACIAL FINDINGS Osseous: Acute right orbital floor fracture with fracture also involving the canal for the right maxillary nerve. 3 mm of caudal displacement of the fracture fragment is identified. Herniation of  orbital fat through the fracture is identified with slight herniation of the nerve as well. Associated medial wall fracture of the orbit is also noted. Intact zygomatic arches. Intact pterygoid plates. The temporomandibular joints are maintained. No mandibular fracture. Nasal bones are intact. Anterior maxillary process is intact. Orbits: Medial right orbital extraconal hemorrhage/edema measuring 2 mm in thickness, series 13, image 31. Small focus of intraconal hemorrhage also noted, series 13, image 34 with retrobulbar or mild edema. The globes appear intact. Sinuses: Hyperdense blood products and herniated retrobulbar fat is noted within the right maxillary sinus. Soft tissues: Right periorbital soft tissue swelling is noted. CT CERVICAL SPINE FINDINGS Alignment: Slight straightening of cervical lordosis. Minimal grade 1 anterolisthesis of C4 on C5. Intact craniocervical relationship. Osteoarthritis of the atlantodental interval. Skull base and vertebrae: No acute fracture. No primary bone lesion or focal pathologic process. Soft tissues and spinal canal: No prevertebral fluid or swelling. No visible canal hematoma. Disc levels: Mild disc space narrowing C4-5, C5-6 and C6-7. No focal disc herniations or significant canal stenosis. Facet arthropathy on the left with posterior marginal osteophytes at C4-5 contribute to left-sided neural foraminal encroachment. Upper chest: No acute abnormality Other: None IMPRESSION: 1. Acute right orbital floor and medial orbital wall fractures with fracture involving the canal for the right maxillary nerve seen along the medial orbital floor. There is 3 mm of caudal displacement of the orbital floor fracture with herniation of periorbital fat and minimal herniation of the right maxillary nerve. 2. Associated with the right orbital fractures are a small amount of medial extraconal hemorrhage/fluid as well as small amount of lateral intraconal hemorrhage. Hyperdense fluid in the  right maxillary sinus would be in keeping with hemorrhage/blood products. Right periorbital soft tissue swelling is noted. 3. No acute intracranial abnormality. 4. Mild cervical spondylosis without acute cervical spine fracture. Electronically Signed   By: Tollie Ethavid  Kwon M.D.   On: 09/07/2017 16:46    Assessment/Plan   ICD-10-CM   1. Closed fracture of right orbit, sequela (HCC) S02.81XS   2. Alcoholism (HCC) F10.20   3. Avascular necrosis of bones of both hips (HCC) M87.051    M87.052   4. Cognitive developmental delay F81.9   5. Anemia, unspecified type D64.9   6. Unsteady gait R26.81  Refer to ophthamology Dr Harvel Quale for right orbital fx f/u  No ENT further w/u needed as she was evaluated by ENT during hospital stay  Re-weigh pt - 130.5 lbs (pt 158 lb 15 oz prior to hospital d/c). Will ck weight daily and record x 2 weeks. May need to start nutritional supplement. Albumin pending  Follow CK  Cont current meds as ordered  Fall precautions  PT/OT/ST as ordered  GOAL: short term rehab with potential for long term care. Communicated with pt and nursing.  Family/ staff Communication: nursing and therapy regarding increased fall risk  Labs/tests ordered: CBC, folate and B12 level (cmp, ck pending)  Kenshawn Maciolek S. Ancil Linsey  [facility] [address] [place], [place] [address] [phone] Cell (Monday-Friday 8 AM - 5 PM) [phone] After 5 PM and follow prompts

## 2017-09-17 DIAGNOSIS — Z9181 History of falling: Secondary | ICD-10-CM | POA: Diagnosis not present

## 2017-09-17 DIAGNOSIS — R2689 Other abnormalities of gait and mobility: Secondary | ICD-10-CM | POA: Diagnosis not present

## 2017-09-17 DIAGNOSIS — M25551 Pain in right hip: Secondary | ICD-10-CM | POA: Diagnosis not present

## 2017-09-17 DIAGNOSIS — R41841 Cognitive communication deficit: Secondary | ICD-10-CM | POA: Diagnosis not present

## 2017-09-17 DIAGNOSIS — M25552 Pain in left hip: Secondary | ICD-10-CM | POA: Diagnosis not present

## 2017-09-17 LAB — CBC AND DIFFERENTIAL
HCT: 27 — AB (ref 36–46)
HEMOGLOBIN: 8.9 — AB (ref 12.0–16.0)
Neutrophils Absolute: 5
Platelets: 373 (ref 150–399)
WBC: 7.7

## 2017-09-17 LAB — VITAMIN B12: Vitamin B-12: 406

## 2017-09-19 DIAGNOSIS — Z9181 History of falling: Secondary | ICD-10-CM | POA: Diagnosis not present

## 2017-09-19 DIAGNOSIS — R2689 Other abnormalities of gait and mobility: Secondary | ICD-10-CM | POA: Diagnosis not present

## 2017-09-19 DIAGNOSIS — M25551 Pain in right hip: Secondary | ICD-10-CM | POA: Diagnosis not present

## 2017-09-19 DIAGNOSIS — R41841 Cognitive communication deficit: Secondary | ICD-10-CM | POA: Diagnosis not present

## 2017-09-19 DIAGNOSIS — M25552 Pain in left hip: Secondary | ICD-10-CM | POA: Diagnosis not present

## 2017-09-25 DIAGNOSIS — M167 Other unilateral secondary osteoarthritis of hip: Secondary | ICD-10-CM | POA: Diagnosis not present

## 2017-09-25 DIAGNOSIS — M87051 Idiopathic aseptic necrosis of right femur: Secondary | ICD-10-CM | POA: Diagnosis not present

## 2017-09-26 ENCOUNTER — Encounter: Payer: Self-pay | Admitting: Adult Health

## 2017-09-26 ENCOUNTER — Non-Acute Institutional Stay (SKILLED_NURSING_FACILITY): Payer: Medicare Other | Admitting: Adult Health

## 2017-09-26 DIAGNOSIS — F418 Other specified anxiety disorders: Secondary | ICD-10-CM | POA: Diagnosis not present

## 2017-09-26 NOTE — Progress Notes (Signed)
Location:   [facility] Room Number: 219 B Place of Service:  SNF (31)   CODE STATUS: Full Code  No Known Allergies  Chief Complaint  Patient presents with  . Acute Visit    Behaviors    HPI:  Staff reports that she is easily agitated and acting out. She will cuss; hit, kick out at others. Her family feels as though she needs something to help with her mood state. We did discuss treatment options and her family feels as though a SSRI would be of benefit for her. She is unable to participate in the phi or ros.    Past Medical History:  Diagnosis Date  . Alcoholism (HCC) 09/08/2017  . Avascular necrosis of bones of both hips (HCC) 09/08/2017  . Cognitive developmental delay 09/08/2017  . Hyperlipidemia   . Hypertension   . Right orbit fracture (HCC) 09/08/2017  . Tongue lesion 09/08/2017    Past Surgical History:  Procedure Laterality Date  . gum tumor     removed    Social History   Socioeconomic History  . Marital status: Widowed    Spouse name: Not on file  . Number of children: Not on file  . Years of education: Not on file  . Highest education level: Not on file  Social Needs  . Financial resource strain: Not on file  . Food insecurity - worry: Not on file  . Food insecurity - inability: Not on file  . Transportation needs - medical: Not on file  . Transportation needs - non-medical: Not on file  Occupational History  . Not on file  Tobacco Use  . Smoking status: Never Smoker  . Smokeless tobacco: Never Used  Substance and Sexual Activity  . Alcohol use: Yes    Comment: 2 times per week  . Drug use: No  . Sexual activity: Not on file  Other Topics Concern  . Not on file  Social History Narrative  . Not on file   Family History  Problem Relation Age of Onset  . Emphysema Mother   . Hypertension Sister   . Heart attack Brother       VITAL SIGNS BP 122/70   Pulse 80   Temp 98.2 F (36.8 C)   Resp 18   Ht 5\' 1"  (1.549 m)    Wt 112 lb 4.8 oz (50.9 kg)   BMI 21.22 kg/m   Outpatient Encounter Medications as of 09/26/2017  Medication Sig  . acetaminophen (TYLENOL) 500 MG tablet Take 1,000 mg by mouth 3 (three) times daily.  . folic acid (FOLVITE) 1 MG tablet Take 1 tablet (1 mg total) by mouth daily.  . Lidocaine 4 % PTCH Apply to right hip topically two times daily  . lovastatin (MEVACOR) 40 MG tablet Take 40 mg by mouth daily.  . naproxen sodium (ALEVE) 220 MG tablet Take 220 mg by mouth 2 (two) times daily.  Marland Kitchen. thiamine 100 MG tablet Take 1 tablet (100 mg total) by mouth daily.   No facility-administered encounter medications on file as of 09/26/2017.      SIGNIFICANT DIAGNOSTIC EXAMS  PREVIOUS:   09-07-17: chest x-ray: No active disease.   09-07-17: ct of head cervical spine; maxillofacial:  1. Acute right orbital floor and medial orbital wall fractures with fracture involving the canal for the right maxillary nerve seen along the medial orbital floor. There is 3 mm of caudal displacement of the orbital floor fracture with herniation of periorbital fat and minimal  herniation of the right maxillary nerve. 2. Associated with the right orbital fractures are a small amount of medial extraconal hemorrhage/fluid as well as small amount of lateral intraconal hemorrhage. Hyperdense fluid in the right maxillary sinus would be in keeping with hemorrhage/blood products. Right periorbital soft tissue swelling is noted. 3. No acute intracranial abnormality. 4. Mild cervical spondylosis without acute cervical spine fracture.  09-07-17: right hip x-ray:  1. No acute abnormality of the bony pelvis or the right hip. 2. 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  09-07-17: ct of pelvis:  1. No acute fracture of the pelvis or hips. 2.  Bilateral hip AVN and advanced osteoarthritis, findings have progressed in the right hip from CT 6 months prior.  NO NEW EXAMS    LABS REVIEWED: PREVOUS:   09-07-17: wbc 7.5; hgb 10.8; hct 30.4; mcv 76.2; plt 298; glucose 105; bun 21; creat 1.18; k+ 3.2; na++ 116; ca 8.9 ck 437; tsh 1.378 09-09-17: glucose 98; bun 9; creat 0.82; k+ 4.1; na++ 115; ca 8.0; mag 1.5; phos 1.9; uric 4.2 09-10-17: wbc 7.7; hgb 9.6; hct 26.6; mcv 75.6 plt 260; glucose 121; bun <5; creat 0.69; k+ 3.4; na++ 112; ca 8.0 09-11-17: wbc 7.1; hgb 9.1; hct 25.8; mcv 75.9; plt 277; glucose 88; bun <5; creat 0.67; k+ 3.5; na++ 118; ca 8.321mag 1.2; phos 3.5  09-11-17: (repeat) glucose 120; bun 6; creat 0.83; k+ 3.8; na++ 130; ca 8.1 09-12-17: wbc 6.9; hgb 8.9; hct 25.2; mcv 78.5; plt 275; glucose 104; bun 6; creat 0.87; k+ 4.1; na++ 130; ca 8.6  NO NEW LABS    Review of Systems  Unable to perform ROS: Dementia (unable to participate )    Physical Exam  Constitutional: No distress.  Frail   Neck: No thyromegaly present.  Cardiovascular: Normal rate, regular rhythm and intact distal pulses.  Murmur heard. Pulmonary/Chest: Effort normal. No stridor. No respiratory distress.  Abdominal: Soft. Bowel sounds are normal. She exhibits no distension. There is no tenderness.  Musculoskeletal: She exhibits no edema.  Able to move all extremities Has right hip crepitus and has pain with movement    Lymphadenopathy:    She has no cervical adenopathy.  Neurological: She is alert.  Skin: Skin is warm and dry. She is not diaphoretic.  Psychiatric:  Easily agitated     ASSESSMENT/ PLAN:  TODAY:   1. Depression with anxiety: is worse; will begin zoloft 25 mg daily for 5 days then 50 mg daily for 5 days then 75 mg daily and will monitor her status.     MD is aware of resident's narcotic use and is in agreement with current plan of care. We will attempt to wean resident as apropriate     Synthia Innocenteborah Green NP [facility]  Contact [phone] Monday through Friday 8am- 5pm  After hours call [phone]

## 2017-09-30 ENCOUNTER — Ambulatory Visit: Payer: Self-pay | Admitting: Orthopedic Surgery

## 2017-10-02 ENCOUNTER — Ambulatory Visit: Payer: Self-pay | Admitting: Orthopedic Surgery

## 2017-10-02 NOTE — H&P (Signed)
TOTAL HIP ADMISSION H&P  Patient is admitted for right total hip arthroplasty.  Subjective:  Chief Complaint: right hip pain  HPI: Norma Ayers, 66 y.o. female, has a history of pain and functional disability in the right hip(s) due to arthritis and patient has failed non-surgical conservative treatments for greater than 12 weeks to include NSAID's and/or analgesics, flexibility and strengthening excercises, use of assistive devices and activity modification.  Onset of symptoms was gradual starting 2 years ago with rapidlly worsening course since that time.The patient noted no past surgery on the right hip(s).  Patient currently rates pain in the right hip at 10 out of 10 with activity. Patient has night pain, worsening of pain with activity and weight bearing, pain that interfers with activities of daily living, crepitus and joint swelling. Patient has evidence of subchondral cysts, subchondral sclerosis, periarticular osteophytes, joint subluxation, joint space narrowing and severe femoral head flattening and bone loss by imaging studies. This condition presents safety issues increasing the risk of falls. This patient has had avascular necrosis of the hip.  There is no current active infection.  Patient Active Problem List   Diagnosis Date Noted  . Right orbit fracture (HCC) 09/08/2017  . Alcoholism (HCC) 09/08/2017  . Tongue lesion 09/08/2017  . Cognitive developmental delay 09/08/2017  . Avascular necrosis of bones of both hips (HCC) 09/08/2017  . Hyponatremia 09/07/2017  . Hyperlipidemia    Past Medical History:  Diagnosis Date  . Alcoholism (HCC) 09/08/2017  . Avascular necrosis of bones of both hips (HCC) 09/08/2017  . Cognitive developmental delay 09/08/2017  . Hyperlipidemia   . Hypertension   . Right orbit fracture (HCC) 09/08/2017  . Tongue lesion 09/08/2017    Past Surgical History:  Procedure Laterality Date  . gum tumor     removed    Current Outpatient Medications   Medication Sig Dispense Refill Last Dose  . acetaminophen (TYLENOL) 500 MG tablet Take 1,000 mg by mouth 3 (three) times daily.   Taking  . folic acid (FOLVITE) 1 MG tablet Take 1 tablet (1 mg total) by mouth daily.   Taking  . Lidocaine 4 % PTCH Apply to right hip topically two times daily   Taking  . lovastatin (MEVACOR) 40 MG tablet Take 40 mg by mouth daily.   Taking  . naproxen sodium (ALEVE) 220 MG tablet Take 220 mg by mouth 2 (two) times daily.   Taking  . thiamine 100 MG tablet Take 1 tablet (100 mg total) by mouth daily.   Taking   No current facility-administered medications for this visit.    No Known Allergies  Social History   Tobacco Use  . Smoking status: Never Smoker  . Smokeless tobacco: Never Used  Substance Use Topics  . Alcohol use: Yes    Comment: 2 times per week    Family History  Problem Relation Age of Onset  . Emphysema Mother   . Hypertension Sister   . Heart attack Brother      Review of Systems  Constitutional: Negative.   HENT: Negative.   Eyes: Negative.   Respiratory: Negative.   Cardiovascular: Negative.   Gastrointestinal: Negative.   Genitourinary: Negative.   Musculoskeletal: Positive for joint pain.  Skin: Negative.   Neurological: Negative.   Endo/Heme/Allergies: Negative.   Psychiatric/Behavioral: Positive for memory loss. The patient is nervous/anxious.     Objective:  Physical Exam  Vitals reviewed. Constitutional: She appears well-developed and well-nourished.  HENT:  Head: Normocephalic and atraumatic.  Eyes: Conjunctivae and EOM are normal. Pupils are equal, round, and reactive to light.  Neck: Normal range of motion. Neck supple.  Cardiovascular: Normal rate, regular rhythm and intact distal pulses.  Respiratory: Effort normal. No respiratory distress.  GI: Soft. She exhibits no distension.  Genitourinary:  Genitourinary Comments: deferred  Musculoskeletal:       Right hip: She exhibits decreased range of motion,  decreased strength, bony tenderness and deformity.  Neurological: She is alert.  Skin: Skin is warm and dry.  Psychiatric:  Anxious, confused    Vital signs in last 24 hours: @VSRANGES @  Labs:   Estimated body mass index is 21.22 kg/m as calculated from the following:   Height as of 09/26/17: 5\' 1"  (1.549 m).   Weight as of 09/26/17: 50.9 kg (112 lb 4.8 oz).   Imaging Review Plain radiographs demonstrate severe degenerative joint disease of the bilateral hip(s). The bone quality appears to be adequate for age and reported activity level.  Assessment/Plan:  End stage arthritis, right hip(s)  The patient history, physical examination, clinical judgement of the provider and imaging studies are consistent with end stage degenerative joint disease of the right hip(s) and total hip arthroplasty is deemed medically necessary. The treatment options including medical management, injection therapy, arthroscopy and arthroplasty were discussed at length. The risks and benefits of total hip arthroplasty were presented and reviewed. The risks due to aseptic loosening, infection, stiffness, dislocation/subluxation,  thromboembolic complications and other imponderables were discussed.  The patient acknowledged the explanation, agreed to proceed with the plan and consent was signed. Patient is being admitted for inpatient treatment for surgery, pain control, PT, OT, prophylactic antibiotics, VTE prophylaxis, progressive ambulation and ADL's and discharge planning.The patient is planning to be discharged to skilled nursing facility

## 2017-10-02 NOTE — H&P (View-Only) (Signed)
TOTAL HIP ADMISSION H&P  Patient is admitted for right total hip arthroplasty.  Subjective:  Chief Complaint: right hip pain  HPI: Norma Ayers, 66 y.o. female, has a history of pain and functional disability in the right hip(s) due to arthritis and patient has failed non-surgical conservative treatments for greater than 12 weeks to include NSAID's and/or analgesics, flexibility and strengthening excercises, use of assistive devices and activity modification.  Onset of symptoms was gradual starting 2 years ago with rapidlly worsening course since that time.The patient noted no past surgery on the right hip(s).  Patient currently rates pain in the right hip at 10 out of 10 with activity. Patient has night pain, worsening of pain with activity and weight bearing, pain that interfers with activities of daily living, crepitus and joint swelling. Patient has evidence of subchondral cysts, subchondral sclerosis, periarticular osteophytes, joint subluxation, joint space narrowing and severe femoral head flattening and bone loss by imaging studies. This condition presents safety issues increasing the risk of falls. This patient has had avascular necrosis of the hip.  There is no current active infection.  Patient Active Problem List   Diagnosis Date Noted  . Right orbit fracture (HCC) 09/08/2017  . Alcoholism (HCC) 09/08/2017  . Tongue lesion 09/08/2017  . Cognitive developmental delay 09/08/2017  . Avascular necrosis of bones of both hips (HCC) 09/08/2017  . Hyponatremia 09/07/2017  . Hyperlipidemia    Past Medical History:  Diagnosis Date  . Alcoholism (HCC) 09/08/2017  . Avascular necrosis of bones of both hips (HCC) 09/08/2017  . Cognitive developmental delay 09/08/2017  . Hyperlipidemia   . Hypertension   . Right orbit fracture (HCC) 09/08/2017  . Tongue lesion 09/08/2017    Past Surgical History:  Procedure Laterality Date  . gum tumor     removed    Current Outpatient Medications   Medication Sig Dispense Refill Last Dose  . acetaminophen (TYLENOL) 500 MG tablet Take 1,000 mg by mouth 3 (three) times daily.   Taking  . folic acid (FOLVITE) 1 MG tablet Take 1 tablet (1 mg total) by mouth daily.   Taking  . Lidocaine 4 % PTCH Apply to right hip topically two times daily   Taking  . lovastatin (MEVACOR) 40 MG tablet Take 40 mg by mouth daily.   Taking  . naproxen sodium (ALEVE) 220 MG tablet Take 220 mg by mouth 2 (two) times daily.   Taking  . thiamine 100 MG tablet Take 1 tablet (100 mg total) by mouth daily.   Taking   No current facility-administered medications for this visit.    No Known Allergies  Social History   Tobacco Use  . Smoking status: Never Smoker  . Smokeless tobacco: Never Used  Substance Use Topics  . Alcohol use: Yes    Comment: 2 times per week    Family History  Problem Relation Age of Onset  . Emphysema Mother   . Hypertension Sister   . Heart attack Brother      Review of Systems  Constitutional: Negative.   HENT: Negative.   Eyes: Negative.   Respiratory: Negative.   Cardiovascular: Negative.   Gastrointestinal: Negative.   Genitourinary: Negative.   Musculoskeletal: Positive for joint pain.  Skin: Negative.   Neurological: Negative.   Endo/Heme/Allergies: Negative.   Psychiatric/Behavioral: Positive for memory loss. The patient is nervous/anxious.     Objective:  Physical Exam  Vitals reviewed. Constitutional: She appears well-developed and well-nourished.  HENT:  Head: Normocephalic and atraumatic.  Eyes: Conjunctivae and EOM are normal. Pupils are equal, round, and reactive to light.  Neck: Normal range of motion. Neck supple.  Cardiovascular: Normal rate, regular rhythm and intact distal pulses.  Respiratory: Effort normal. No respiratory distress.  GI: Soft. She exhibits no distension.  Genitourinary:  Genitourinary Comments: deferred  Musculoskeletal:       Right hip: She exhibits decreased range of motion,  decreased strength, bony tenderness and deformity.  Neurological: She is alert.  Skin: Skin is warm and dry.  Psychiatric:  Anxious, confused    Vital signs in last 24 hours: @VSRANGES@  Labs:   Estimated body mass index is 21.22 kg/m as calculated from the following:   Height as of 09/26/17: 5' 1" (1.549 m).   Weight as of 09/26/17: 50.9 kg (112 lb 4.8 oz).   Imaging Review Plain radiographs demonstrate severe degenerative joint disease of the bilateral hip(s). The bone quality appears to be adequate for age and reported activity level.  Assessment/Plan:  End stage arthritis, right hip(s)  The patient history, physical examination, clinical judgement of the provider and imaging studies are consistent with end stage degenerative joint disease of the right hip(s) and total hip arthroplasty is deemed medically necessary. The treatment options including medical management, injection therapy, arthroscopy and arthroplasty were discussed at length. The risks and benefits of total hip arthroplasty were presented and reviewed. The risks due to aseptic loosening, infection, stiffness, dislocation/subluxation,  thromboembolic complications and other imponderables were discussed.  The patient acknowledged the explanation, agreed to proceed with the plan and consent was signed. Patient is being admitted for inpatient treatment for surgery, pain control, PT, OT, prophylactic antibiotics, VTE prophylaxis, progressive ambulation and ADL's and discharge planning.The patient is planning to be discharged to skilled nursing facility 

## 2017-10-03 DIAGNOSIS — I10 Essential (primary) hypertension: Secondary | ICD-10-CM | POA: Diagnosis not present

## 2017-10-03 DIAGNOSIS — Z79899 Other long term (current) drug therapy: Secondary | ICD-10-CM | POA: Diagnosis not present

## 2017-10-03 LAB — BASIC METABOLIC PANEL
BUN: 9 (ref 4–21)
Creatinine: 0.8 (ref 0.5–1.1)
Glucose: 94
POTASSIUM: 4.5 (ref 3.4–5.3)
SODIUM: 141 (ref 137–147)

## 2017-10-03 LAB — HEPATIC FUNCTION PANEL
ALK PHOS: 80 (ref 25–125)
ALT: 11 (ref 7–35)
AST: 12 — AB (ref 13–35)
Bilirubin, Total: 0.2

## 2017-10-04 ENCOUNTER — Encounter: Payer: Self-pay | Admitting: Adult Health

## 2017-10-04 ENCOUNTER — Other Ambulatory Visit: Payer: Self-pay

## 2017-10-04 ENCOUNTER — Non-Acute Institutional Stay (SKILLED_NURSING_FACILITY): Payer: Medicare Other | Admitting: Adult Health

## 2017-10-04 DIAGNOSIS — M87051 Idiopathic aseptic necrosis of right femur: Secondary | ICD-10-CM | POA: Diagnosis not present

## 2017-10-04 DIAGNOSIS — R451 Restlessness and agitation: Secondary | ICD-10-CM

## 2017-10-04 MED ORDER — LORAZEPAM 0.5 MG PO TABS: ORAL_TABLET | ORAL | 0 refills | Status: DC

## 2017-10-04 NOTE — Progress Notes (Signed)
Spoke with poa sister peggy kline and asked her to get nursing home to fax h and p, current mar and labs within 30 days and poa papers asap and she stated she would call [facility] and request record be faxed for pt.

## 2017-10-04 NOTE — Telephone Encounter (Signed)
RX faxed to [facility] @ [phone], phone number [phone] 

## 2017-10-04 NOTE — Progress Notes (Signed)
Location:   [facility] Room Number: 219 B Place of Service:  SNF (31)   CODE STATUS: Full Code  No Known Allergies  Chief Complaint  Patient presents with  . Acute Visit    Follow up Lab results    HPI:  Her lab work has returned; she has a low mag level; which will need to supplemented. Staff reports that she is combative with care; and that she continues to have significant bilateral hip pain. She is unable to participate in the hpi or ros. Staff feels as though a low dose ativan would help her not to be so combative with morning care.    Past Medical History:  Diagnosis Date  . Alcoholism (HCC) 09/08/2017  . Avascular necrosis of bones of both hips (HCC) 09/08/2017  . Cognitive developmental delay 09/08/2017  . Hyperlipidemia   . Hypertension   . Right orbit fracture (HCC) 09/08/2017  . Tongue lesion 09/08/2017    Past Surgical History:  Procedure Laterality Date  . gum tumor     removed    Social History   Socioeconomic History  . Marital status: Widowed    Spouse name: Not on file  . Number of children: Not on file  . Years of education: Not on file  . Highest education level: Not on file  Social Needs  . Financial resource strain: Not on file  . Food insecurity - worry: Not on file  . Food insecurity - inability: Not on file  . Transportation needs - medical: Not on file  . Transportation needs - non-medical: Not on file  Occupational History  . Not on file  Tobacco Use  . Smoking status: Never Smoker  . Smokeless tobacco: Never Used  Substance and Sexual Activity  . Alcohol use: Yes    Comment: 2 times per week  . Drug use: No  . Sexual activity: Not on file  Other Topics Concern  . Not on file  Social History Narrative  . Not on file   Family History  Problem Relation Age of Onset  . Emphysema Mother   . Hypertension Sister   . Heart attack Brother       VITAL SIGNS BP (!) 98/58   Pulse 80   Temp (!) 97.1 F (36.2 C)    Resp 14   Ht 5\' 1"  (1.549 m)   Wt 112 lb 4.8 oz (50.9 kg)   SpO2 97%   BMI 21.22 kg/m   Outpatient Encounter Medications as of 10/04/2017  Medication Sig  . acetaminophen (TYLENOL) 500 MG tablet Take 1,000 mg by mouth 3 (three) times daily.  . folic acid (FOLVITE) 1 MG tablet Take 1 tablet (1 mg total) by mouth daily.  . Lidocaine 4 % PTCH Apply to right hip topically two times daily  . lovastatin (MEVACOR) 40 MG tablet Take 40 mg by mouth daily.  . naproxen sodium (ALEVE) 220 MG tablet Take 220 mg by mouth 2 (two) times daily.  . sertraline (ZOLOFT) 25 MG tablet Take 25 mg by mouth daily. X 5 days, ending 10/09/17  . [START ON 10/14/2017] sertraline (ZOLOFT) 25 MG tablet Take 75 mg by mouth daily. Beginning on 10/14/17  . [START ON 10/09/2017] sertraline (ZOLOFT) 50 MG tablet Take 50 mg by mouth daily. X 5 days, ending on 10/14/17  . thiamine 100 MG tablet Take 1 tablet (100 mg total) by mouth daily.   No facility-administered encounter medications on file as of 10/04/2017.  SIGNIFICANT DIAGNOSTIC EXAMS  PREVIOUS:   09-07-17: chest x-ray: No active disease.   09-07-17: ct of head cervical spine; maxillofacial:  1. Acute right orbital floor and medial orbital wall fractures with fracture involving the canal for the right maxillary nerve seen along the medial orbital floor. There is 3 mm of caudal displacement of the orbital floor fracture with herniation of periorbital fat and minimal herniation of the right maxillary nerve. 2. Associated with the right orbital fractures are a small amount of medial extraconal hemorrhage/fluid as well as small amount of lateral intraconal hemorrhage. Hyperdense fluid in the right maxillary sinus would be in keeping with hemorrhage/blood products. Right periorbital soft tissue swelling is noted. 3. No acute intracranial abnormality. 4. Mild cervical spondylosis without acute cervical spine fracture.  09-07-17: right hip x-ray:  1. No acute  abnormality of the bony pelvis or the right hip. 2. 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  09-07-17: ct of pelvis:  1. No acute fracture of the pelvis or hips. 2. Bilateral hip AVN and advanced osteoarthritis, findings have progressed in the right hip from CT 6 months prior.  NO NEW EXAMS    LABS REVIEWED: PREVOUS:   09-07-17: wbc 7.5; hgb 10.8; hct 30.4; mcv 76.2; plt 298; glucose 105; bun 21; creat 1.18; k+ 3.2; na++ 116; ca 8.9 ck 437; tsh 1.378 09-09-17: glucose 98; bun 9; creat 0.82; k+ 4.1; na++ 115; ca 8.0; mag 1.5; phos 1.9; uric 4.2 09-10-17: wbc 7.7; hgb 9.6; hct 26.6; mcv 75.6 plt 260; glucose 121; bun <5; creat 0.69; k+ 3.4; na++ 112; ca 8.0 09-11-17: wbc 7.1; hgb 9.1; hct 25.8; mcv 75.9; plt 277; glucose 88; bun <5; creat 0.67; k+ 3.5; na++ 118; ca 8.631mag 1.2; phos 3.5  09-11-17: (repeat) glucose 120; bun 6; creat 0.83; k+ 3.8; na++ 130; ca 8.1 09-12-17: wbc 6.9; hgb 8.9; hct 25.2; mcv 78.5; plt 275; glucose 104; bun 6; creat 0.87; k+ 4.1; na++ 130; ca 8.6  TODAY:   10-03-17: glucose 94; bun 9.1; creat 0.77; k+ 4.5; na++ 141; ca 9.4; liver normal albumin 3.6; mag 1.6     Review of Systems  Unable to perform ROS: Dementia (unable to participate )     Physical Exam  Constitutional: No distress.  Frail   Neck: No thyromegaly present.  Cardiovascular: Normal rate, regular rhythm and intact distal pulses.  Murmur heard. 1/6  Pulmonary/Chest: Effort normal and breath sounds normal. No respiratory distress.  Abdominal: Soft. Bowel sounds are normal. She exhibits no distension. There is no tenderness.  Musculoskeletal: She exhibits no edema.  Able to move all extremities Has right hip crepitus and has pain with movement    Lymphadenopathy:    She has no cervical adenopathy.    Neurological: She is alert.  Skin: Skin is warm and dry. She is not diaphoretic.  Psychiatric:  Is easily agitated     ASSESSMENT/ PLAN:  TODAY:   1. Hypomagnesemia  2. Avascular necrosis right hip 3. Agitation   Will begin mag ox 400 mg daily Will increase aleve 440 mg twice daily  Will begin ativan 0.5 mg daily  Will check mag on 10-10-17.   MD is aware of resident's narcotic use and is in agreement with current plan of care. We will attempt to wean resident as apropriate   Synthia Innocenteborah Green NP [facility]  Contact [phone] Monday through Friday 8am- 5pm  After hours call [phone]

## 2017-10-09 ENCOUNTER — Other Ambulatory Visit: Payer: Self-pay

## 2017-10-09 ENCOUNTER — Encounter (HOSPITAL_COMMUNITY): Payer: Self-pay | Admitting: *Deleted

## 2017-10-09 NOTE — Progress Notes (Signed)
Spoke with sisters Engineer, technical salesdoris brewer and peggy kline poas and are arare patient needs to arrive 1230 am 10-10-17 [facility] admitting for surgery and patient needs to bring copy of current medication record and last time ate and drank and sister peggy kline will provide transportation and sign consent for surgery. Spoke with judy no faxed instructions received will refax to [facility].

## 2017-10-09 NOTE — Progress Notes (Signed)
Healthcare poa received and placed on patient chart.

## 2017-10-09 NOTE — Progress Notes (Signed)
Spoke with laurie faust and gave her [facility] phone and fax number and made aware dr swinteck must fax order for hibiclens shower hs and am to nursing home for showers to be done.

## 2017-10-09 NOTE — Progress Notes (Addendum)
Preop instructions for Norma GianottiBETTY Ayers  Date of Birth  07-05- 1952                         Date of Procedure:  10/10/17     Doctor: Linna CapriceSWINTECK [facility]30 Report to: Admitting  Procedure: RIGHT TOTAL HIP ARTHROPLASTY, ANTERIOR APPROACH Any procedure time changes WE WILL CALL YOU!   Do not eat  past midnight, MAY HAVE WATER FROM MIDNIGHT UNTIL 930  AM DAY OF PROCEDURE , NO WATER AFTER 930 AM DAY OF PROCEDURE, NO NOTHING BY MOUTH AFTER 930 AM DAY OF PROCEDURE.   Take these morning medications only with sips of water. SERTALINE (ZOLOFT), LORAZAPAM (ATIVAN),  MAY LEAVE LIDODERM PATCH ON , ONLY GIVE MEDS IF SHE MAY TAKES MEDS WITH SIPS OF WATER ONLY     Facility contactAnnice Pih:  JACKIE  Phone:[phone] FAX [phone]     Health Care POA: [name] KLINE SISTER TO SIGN CONSENT CELL [phone]  Transportation contact phone#: [facility] [phone]  Please send day of procedure:current med list and meds last taken that day, confirm nothing by mouth status from what time, Patient Demographic info( to include DNR status, problem list, allergies)    Bring Insurance card and picture ID Leave all jewelry and other valuables at place where living( no metal or rings to be worn) No contact lens Women-no make-up, no lotions,perfumes,powders Men-no colognes,lotions  Any questions day of procedure,call SHORT STAY [phone]  [facility]                   [phone]                  [phone]  Sent by : Cain SieveSHARON Charli Halle RN

## 2017-10-09 NOTE — Progress Notes (Signed)
Spoke with  Nurse judy and all faxed instructions for surgery received and understood. Will call if have questions regarding pre op instuctions.

## 2017-10-10 ENCOUNTER — Inpatient Hospital Stay (HOSPITAL_COMMUNITY): Payer: Medicare Other | Admitting: Certified Registered Nurse Anesthetist

## 2017-10-10 ENCOUNTER — Inpatient Hospital Stay (HOSPITAL_COMMUNITY): Payer: Medicare Other

## 2017-10-10 ENCOUNTER — Encounter (HOSPITAL_COMMUNITY): Payer: Self-pay | Admitting: *Deleted

## 2017-10-10 ENCOUNTER — Other Ambulatory Visit: Payer: Self-pay

## 2017-10-10 ENCOUNTER — Encounter (HOSPITAL_COMMUNITY)
Admission: RE | Disposition: A | Discharge status: Skilled Nursing Facility | Payer: Self-pay | Source: Ambulatory Visit | Attending: Orthopedic Surgery

## 2017-10-10 ENCOUNTER — Inpatient Hospital Stay (HOSPITAL_COMMUNITY)
Admission: RE | Admit: 2017-10-10 | Discharge: 2017-10-14 | DRG: 470 | Disposition: A | Discharge status: Skilled Nursing Facility | Payer: Medicare Other | Source: Ambulatory Visit | Attending: Orthopedic Surgery | Admitting: Orthopedic Surgery

## 2017-10-10 DIAGNOSIS — Z79899 Other long term (current) drug therapy: Secondary | ICD-10-CM

## 2017-10-10 DIAGNOSIS — M87051 Idiopathic aseptic necrosis of right femur: Secondary | ICD-10-CM | POA: Diagnosis not present

## 2017-10-10 DIAGNOSIS — Z419 Encounter for procedure for purposes other than remedying health state, unspecified: Secondary | ICD-10-CM

## 2017-10-10 DIAGNOSIS — Z471 Aftercare following joint replacement surgery: Secondary | ICD-10-CM | POA: Diagnosis not present

## 2017-10-10 DIAGNOSIS — Z791 Long term (current) use of non-steroidal anti-inflammatories (NSAID): Secondary | ICD-10-CM

## 2017-10-10 DIAGNOSIS — E785 Hyperlipidemia, unspecified: Secondary | ICD-10-CM | POA: Diagnosis present

## 2017-10-10 DIAGNOSIS — E871 Hypo-osmolality and hyponatremia: Secondary | ICD-10-CM | POA: Diagnosis not present

## 2017-10-10 DIAGNOSIS — M25551 Pain in right hip: Secondary | ICD-10-CM | POA: Diagnosis not present

## 2017-10-10 DIAGNOSIS — M879 Osteonecrosis, unspecified: Secondary | ICD-10-CM | POA: Diagnosis present

## 2017-10-10 DIAGNOSIS — F79 Unspecified intellectual disabilities: Secondary | ICD-10-CM | POA: Diagnosis present

## 2017-10-10 DIAGNOSIS — I10 Essential (primary) hypertension: Secondary | ICD-10-CM | POA: Diagnosis present

## 2017-10-10 DIAGNOSIS — Z96641 Presence of right artificial hip joint: Secondary | ICD-10-CM | POA: Diagnosis not present

## 2017-10-10 DIAGNOSIS — Z09 Encounter for follow-up examination after completed treatment for conditions other than malignant neoplasm: Secondary | ICD-10-CM

## 2017-10-10 DIAGNOSIS — G8911 Acute pain due to trauma: Secondary | ICD-10-CM | POA: Diagnosis not present

## 2017-10-10 HISTORY — PX: TOTAL HIP ARTHROPLASTY: SHX124

## 2017-10-10 LAB — CBC
HEMATOCRIT: 34.7 % — AB (ref 36.0–46.0)
HEMOGLOBIN: 10.9 g/dL — AB (ref 12.0–15.0)
MCH: 26.4 pg (ref 26.0–34.0)
MCHC: 31.4 g/dL (ref 30.0–36.0)
MCV: 84 fL (ref 78.0–100.0)
Platelets: 364 10*3/uL (ref 150–400)
RBC: 4.13 MIL/uL (ref 3.87–5.11)
RDW: 13.3 % (ref 11.5–15.5)
WBC: 7 10*3/uL (ref 4.0–10.5)

## 2017-10-10 LAB — SURGICAL PCR SCREEN
MRSA, PCR: NEGATIVE
STAPHYLOCOCCUS AUREUS: POSITIVE — AB

## 2017-10-10 SURGERY — ARTHROPLASTY, HIP, TOTAL, ANTERIOR APPROACH
Anesthesia: General | Site: Hip | Laterality: Right

## 2017-10-10 MED ORDER — SERTRALINE HCL 50 MG PO TABS
50.0000 mg | ORAL_TABLET | Freq: Every day | ORAL | Status: AC
  Administered 2017-10-10 – 2017-10-14 (×5): 50 mg via ORAL
  Filled 2017-10-10 (×5): qty 1

## 2017-10-10 MED ORDER — ACETAMINOPHEN 10 MG/ML IV SOLN
1000.0000 mg | INTRAVENOUS | Status: AC
  Administered 2017-10-10: 1000 mg via INTRAVENOUS
  Filled 2017-10-10: qty 100

## 2017-10-10 MED ORDER — DEXMEDETOMIDINE HCL IN NACL 200 MCG/50ML IV SOLN
INTRAVENOUS | Status: AC
  Filled 2017-10-10: qty 50

## 2017-10-10 MED ORDER — KETOROLAC TROMETHAMINE 30 MG/ML IJ SOLN
INTRAMUSCULAR | Status: DC | PRN
  Administered 2017-10-10: 30 mg via INTRAMUSCULAR

## 2017-10-10 MED ORDER — METOCLOPRAMIDE HCL 5 MG PO TABS: 5.0000 mg | ORAL_TABLET | Freq: Three times a day (TID) | ORAL | Status: DC | PRN

## 2017-10-10 MED ORDER — DOCUSATE SODIUM 100 MG PO CAPS
100.0000 mg | ORAL_CAPSULE | Freq: Two times a day (BID) | ORAL | Status: DC
  Administered 2017-10-10 – 2017-10-14 (×8): 100 mg via ORAL
  Filled 2017-10-10 (×8): qty 1

## 2017-10-10 MED ORDER — DEXAMETHASONE SODIUM PHOSPHATE 10 MG/ML IJ SOLN
INTRAMUSCULAR | Status: AC
  Filled 2017-10-10: qty 1

## 2017-10-10 MED ORDER — KETOROLAC TROMETHAMINE 30 MG/ML IJ SOLN
INTRAMUSCULAR | Status: AC
  Filled 2017-10-10: qty 1

## 2017-10-10 MED ORDER — ACETAMINOPHEN 325 MG PO TABS
650.0000 mg | ORAL_TABLET | ORAL | Status: DC | PRN
  Administered 2017-10-11 – 2017-10-13 (×2): 650 mg via ORAL
  Filled 2017-10-10 (×2): qty 2

## 2017-10-10 MED ORDER — HYDROCODONE-ACETAMINOPHEN 5-325 MG PO TABS
1.0000 | ORAL_TABLET | ORAL | Status: DC | PRN
  Administered 2017-10-11 – 2017-10-14 (×10): 1 via ORAL
  Filled 2017-10-10 (×10): qty 1

## 2017-10-10 MED ORDER — LACTATED RINGERS IV SOLN: INTRAVENOUS | Status: DC

## 2017-10-10 MED ORDER — EPHEDRINE SULFATE-NACL 50-0.9 MG/10ML-% IV SOSY
PREFILLED_SYRINGE | INTRAVENOUS | Status: DC | PRN
  Administered 2017-10-10: 15 mg via INTRAVENOUS

## 2017-10-10 MED ORDER — ADULT MULTIVITAMIN W/MINERALS CH
1.0000 | ORAL_TABLET | Freq: Every day | ORAL | Status: DC
  Administered 2017-10-11 – 2017-10-14 (×4): 1 via ORAL
  Filled 2017-10-10 (×4): qty 1

## 2017-10-10 MED ORDER — FENTANYL CITRATE (PF) 100 MCG/2ML IJ SOLN
INTRAMUSCULAR | Status: AC
  Filled 2017-10-10: qty 2

## 2017-10-10 MED ORDER — BOOST / RESOURCE BREEZE PO LIQD CUSTOM
1.0000 | Freq: Three times a day (TID) | ORAL | Status: DC
  Administered 2017-10-10 – 2017-10-11 (×2): 1 via ORAL

## 2017-10-10 MED ORDER — DEXAMETHASONE SODIUM PHOSPHATE 10 MG/ML IJ SOLN
10.0000 mg | Freq: Once | INTRAMUSCULAR | Status: DC
  Filled 2017-10-10 (×3): qty 1

## 2017-10-10 MED ORDER — MEPERIDINE HCL 50 MG/ML IJ SOLN: 6.2500 mg | INTRAMUSCULAR | Status: DC | PRN

## 2017-10-10 MED ORDER — APIXABAN 2.5 MG PO TABS
2.5000 mg | ORAL_TABLET | Freq: Two times a day (BID) | ORAL | Status: DC
  Administered 2017-10-11 – 2017-10-14 (×7): 2.5 mg via ORAL
  Filled 2017-10-10 (×7): qty 1

## 2017-10-10 MED ORDER — ONDANSETRON HCL 4 MG/2ML IJ SOLN: 4.0000 mg | Freq: Four times a day (QID) | INTRAMUSCULAR | Status: DC | PRN

## 2017-10-10 MED ORDER — CHLORHEXIDINE GLUCONATE 4 % EX LIQD: 60.0000 mL | Freq: Once | CUTANEOUS | Status: DC

## 2017-10-10 MED ORDER — ENSURE ENLIVE PO LIQD
237.0000 mL | Freq: Two times a day (BID) | ORAL | Status: DC
  Administered 2017-10-11: 237 mL via ORAL

## 2017-10-10 MED ORDER — FENTANYL CITRATE (PF) 250 MCG/5ML IJ SOLN
INTRAMUSCULAR | Status: DC | PRN
  Administered 2017-10-10: 50 ug via INTRAVENOUS
  Administered 2017-10-10: 100 ug via INTRAVENOUS

## 2017-10-10 MED ORDER — SODIUM CHLORIDE 0.9 % IJ SOLN
INTRAMUSCULAR | Status: AC
  Filled 2017-10-10: qty 50

## 2017-10-10 MED ORDER — WATER FOR IRRIGATION, STERILE IR SOLN
Status: DC | PRN
  Administered 2017-10-10: 2000 mL

## 2017-10-10 MED ORDER — DIPHENHYDRAMINE HCL 12.5 MG/5ML PO ELIX: 12.5000 mg | ORAL_SOLUTION | ORAL | Status: DC | PRN

## 2017-10-10 MED ORDER — CEFAZOLIN SODIUM-DEXTROSE 1-4 GM/50ML-% IV SOLN
1.0000 g | Freq: Four times a day (QID) | INTRAVENOUS | Status: AC
  Administered 2017-10-10 – 2017-10-11 (×2): 1 g via INTRAVENOUS
  Filled 2017-10-10 (×2): qty 50

## 2017-10-10 MED ORDER — SERTRALINE HCL 50 MG PO TABS: 75.0000 mg | ORAL_TABLET | Freq: Every day | ORAL | Status: DC

## 2017-10-10 MED ORDER — PHENOL 1.4 % MT LIQD: 1.0000 | OROMUCOSAL | Status: DC | PRN

## 2017-10-10 MED ORDER — ROCURONIUM BROMIDE 10 MG/ML (PF) SYRINGE
PREFILLED_SYRINGE | INTRAVENOUS | Status: DC | PRN
  Administered 2017-10-10: 50 mg via INTRAVENOUS

## 2017-10-10 MED ORDER — SENNA 8.6 MG PO TABS
2.0000 | ORAL_TABLET | Freq: Every day | ORAL | Status: DC
  Administered 2017-10-10 – 2017-10-13 (×3): 17.2 mg via ORAL
  Administered 2017-10-13: 01:00:00 8.6 mg via ORAL
  Filled 2017-10-10 (×4): qty 2

## 2017-10-10 MED ORDER — ONDANSETRON HCL 4 MG/2ML IJ SOLN
INTRAMUSCULAR | Status: DC | PRN
  Administered 2017-10-10: 4 mg via INTRAVENOUS

## 2017-10-10 MED ORDER — LORAZEPAM 0.5 MG PO TABS
0.5000 mg | ORAL_TABLET | Freq: Every day | ORAL | Status: DC
  Administered 2017-10-10 – 2017-10-14 (×5): 0.5 mg via ORAL
  Filled 2017-10-10 (×5): qty 1

## 2017-10-10 MED ORDER — FOLIC ACID 1 MG PO TABS
1.0000 mg | ORAL_TABLET | Freq: Every day | ORAL | Status: DC
  Administered 2017-10-11 – 2017-10-14 (×4): 1 mg via ORAL
  Filled 2017-10-10 (×4): qty 1

## 2017-10-10 MED ORDER — LORAZEPAM 2 MG/ML IJ SOLN: 1.0000 mg | Freq: Four times a day (QID) | INTRAMUSCULAR | Status: AC | PRN

## 2017-10-10 MED ORDER — MIDAZOLAM HCL 2 MG/2ML IJ SOLN
INTRAMUSCULAR | Status: AC
  Filled 2017-10-10: qty 2

## 2017-10-10 MED ORDER — DEXTROSE 5 % IV SOLN
500.0000 mg | Freq: Four times a day (QID) | INTRAVENOUS | Status: DC | PRN
  Filled 2017-10-10: qty 5

## 2017-10-10 MED ORDER — ONDANSETRON HCL 4 MG PO TABS: 4.0000 mg | ORAL_TABLET | Freq: Four times a day (QID) | ORAL | Status: DC | PRN

## 2017-10-10 MED ORDER — SUGAMMADEX SODIUM 200 MG/2ML IV SOLN
INTRAVENOUS | Status: AC
  Filled 2017-10-10: qty 2

## 2017-10-10 MED ORDER — PRAVASTATIN SODIUM 20 MG PO TABS
10.0000 mg | ORAL_TABLET | Freq: Every day | ORAL | Status: DC
  Administered 2017-10-10 – 2017-10-13 (×4): 10 mg via ORAL
  Filled 2017-10-10 (×4): qty 1

## 2017-10-10 MED ORDER — SUGAMMADEX SODIUM 200 MG/2ML IV SOLN
INTRAVENOUS | Status: DC | PRN
  Administered 2017-10-10: 100 mg via INTRAVENOUS

## 2017-10-10 MED ORDER — LORAZEPAM 1 MG PO TABS
1.0000 mg | ORAL_TABLET | Freq: Four times a day (QID) | ORAL | Status: AC | PRN
  Administered 2017-10-13: 1 mg via ORAL
  Filled 2017-10-10: qty 1

## 2017-10-10 MED ORDER — ISOPROPYL ALCOHOL 70 % SOLN
Status: DC | PRN
  Administered 2017-10-10: 1 via TOPICAL

## 2017-10-10 MED ORDER — VITAMIN B-1 100 MG PO TABS
100.0000 mg | ORAL_TABLET | Freq: Every day | ORAL | Status: DC
  Administered 2017-10-11 – 2017-10-14 (×4): 100 mg via ORAL
  Filled 2017-10-10 (×4): qty 1

## 2017-10-10 MED ORDER — TRANEXAMIC ACID 1000 MG/10ML IV SOLN
1000.0000 mg | INTRAVENOUS | Status: AC
  Administered 2017-10-10: 1000 mg via INTRAVENOUS
  Filled 2017-10-10: qty 1100

## 2017-10-10 MED ORDER — MENTHOL 3 MG MT LOZG: 1.0000 | LOZENGE | OROMUCOSAL | Status: DC | PRN

## 2017-10-10 MED ORDER — DEXMEDETOMIDINE HCL IN NACL 200 MCG/50ML IV SOLN
INTRAVENOUS | Status: DC | PRN
  Administered 2017-10-10: 50 ug via INTRAVENOUS

## 2017-10-10 MED ORDER — ROCURONIUM BROMIDE 50 MG/5ML IV SOSY
PREFILLED_SYRINGE | INTRAVENOUS | Status: AC
  Filled 2017-10-10: qty 5

## 2017-10-10 MED ORDER — ALUM & MAG HYDROXIDE-SIMETH 200-200-20 MG/5ML PO SUSP: 30.0000 mL | ORAL | Status: DC | PRN

## 2017-10-10 MED ORDER — POLYETHYLENE GLYCOL 3350 17 G PO PACK: 17.0000 g | PACK | Freq: Every day | ORAL | Status: DC | PRN

## 2017-10-10 MED ORDER — EPHEDRINE 5 MG/ML INJ
INTRAVENOUS | Status: AC
  Filled 2017-10-10: qty 10

## 2017-10-10 MED ORDER — SODIUM CHLORIDE 0.9 % IR SOLN
Status: DC | PRN
  Administered 2017-10-10: 1000 mL

## 2017-10-10 MED ORDER — LIDOCAINE 2% (20 MG/ML) 5 ML SYRINGE
INTRAMUSCULAR | Status: DC | PRN
  Administered 2017-10-10: 40 mg via INTRAVENOUS

## 2017-10-10 MED ORDER — LIDOCAINE 2% (20 MG/ML) 5 ML SYRINGE
INTRAMUSCULAR | Status: AC
  Filled 2017-10-10: qty 5

## 2017-10-10 MED ORDER — ACETAMINOPHEN 650 MG RE SUPP: 650.0000 mg | RECTAL | Status: DC | PRN

## 2017-10-10 MED ORDER — METOCLOPRAMIDE HCL 5 MG/ML IJ SOLN: 10.0000 mg | Freq: Once | INTRAMUSCULAR | Status: DC | PRN

## 2017-10-10 MED ORDER — SODIUM CHLORIDE 0.9 % IV SOLN
INTRAVENOUS | Status: DC
  Administered 2017-10-10 – 2017-10-11 (×2): via INTRAVENOUS

## 2017-10-10 MED ORDER — CEFAZOLIN SODIUM-DEXTROSE 2-4 GM/100ML-% IV SOLN
2.0000 g | INTRAVENOUS | Status: AC
  Administered 2017-10-10: 2 g via INTRAVENOUS
  Filled 2017-10-10: qty 100

## 2017-10-10 MED ORDER — SODIUM CHLORIDE 0.9 % IV SOLN
1000.0000 mg | Freq: Once | INTRAVENOUS | Status: AC
  Administered 2017-10-10: 22:00:00 1000 mg via INTRAVENOUS
  Filled 2017-10-10: qty 1100

## 2017-10-10 MED ORDER — ONDANSETRON HCL 4 MG/2ML IJ SOLN
INTRAMUSCULAR | Status: AC
  Filled 2017-10-10: qty 2

## 2017-10-10 MED ORDER — SODIUM CHLORIDE 0.9 % IJ SOLN
INTRAMUSCULAR | Status: DC | PRN
  Administered 2017-10-10: 20 mL

## 2017-10-10 MED ORDER — HYDROMORPHONE HCL 1 MG/ML IJ SOLN: 0.5000 mg | INTRAMUSCULAR | Status: DC | PRN

## 2017-10-10 MED ORDER — KETOROLAC TROMETHAMINE 15 MG/ML IJ SOLN
7.5000 mg | Freq: Four times a day (QID) | INTRAMUSCULAR | Status: AC
  Administered 2017-10-10 – 2017-10-11 (×4): 7.5 mg via INTRAVENOUS
  Filled 2017-10-10 (×4): qty 1

## 2017-10-10 MED ORDER — PROPOFOL 10 MG/ML IV BOLUS
INTRAVENOUS | Status: AC
  Filled 2017-10-10: qty 20

## 2017-10-10 MED ORDER — LACTATED RINGERS IV SOLN
INTRAVENOUS | Status: DC
  Administered 2017-10-10 (×3): via INTRAVENOUS

## 2017-10-10 MED ORDER — BUPIVACAINE-EPINEPHRINE 0.25% -1:200000 IJ SOLN
INTRAMUSCULAR | Status: DC | PRN
  Administered 2017-10-10: 30 mL

## 2017-10-10 MED ORDER — METHOCARBAMOL 500 MG PO TABS
500.0000 mg | ORAL_TABLET | Freq: Four times a day (QID) | ORAL | Status: DC | PRN
  Administered 2017-10-11 – 2017-10-13 (×5): 500 mg via ORAL
  Filled 2017-10-10 (×5): qty 1

## 2017-10-10 MED ORDER — METOCLOPRAMIDE HCL 5 MG/ML IJ SOLN: 5.0000 mg | Freq: Three times a day (TID) | INTRAMUSCULAR | Status: DC | PRN

## 2017-10-10 MED ORDER — HYDROCODONE-ACETAMINOPHEN 5-325 MG PO TABS: 2.0000 | ORAL_TABLET | ORAL | Status: DC | PRN

## 2017-10-10 MED ORDER — PHENYLEPHRINE 40 MCG/ML (10ML) SYRINGE FOR IV PUSH (FOR BLOOD PRESSURE SUPPORT)
PREFILLED_SYRINGE | INTRAVENOUS | Status: AC
  Filled 2017-10-10: qty 10

## 2017-10-10 MED ORDER — BUPIVACAINE-EPINEPHRINE (PF) 0.5% -1:200000 IJ SOLN
INTRAMUSCULAR | Status: AC
  Filled 2017-10-10: qty 30

## 2017-10-10 MED ORDER — FENTANYL CITRATE (PF) 100 MCG/2ML IJ SOLN: 25.0000 ug | INTRAMUSCULAR | Status: DC | PRN

## 2017-10-10 MED ORDER — PROPOFOL 10 MG/ML IV BOLUS
INTRAVENOUS | Status: DC | PRN
  Administered 2017-10-10: 150 mg via INTRAVENOUS

## 2017-10-10 MED ORDER — POVIDONE-IODINE 10 % EX SWAB
2.0000 "application " | Freq: Once | CUTANEOUS | Status: AC
  Administered 2017-10-10: 2 via TOPICAL

## 2017-10-10 SURGICAL SUPPLY — 46 items
ADH SKN CLS APL DERMABOND .7 (GAUZE/BANDAGES/DRESSINGS) ×2
BAG DECANTER FOR FLEXI CONT (MISCELLANEOUS) IMPLANT
BAG SPEC THK2 15X12 ZIP CLS (MISCELLANEOUS)
BAG ZIPLOCK 12X15 (MISCELLANEOUS) IMPLANT
CAPT HIP TOTAL 2 ×2 IMPLANT
CHLORAPREP W/TINT 26ML (MISCELLANEOUS) ×3 IMPLANT
CLOTH BEACON ORANGE TIMEOUT ST (SAFETY) ×3 IMPLANT
COVER PERINEAL POST (MISCELLANEOUS) ×3 IMPLANT
COVER SURGICAL LIGHT HANDLE (MISCELLANEOUS) ×3 IMPLANT
DECANTER SPIKE VIAL GLASS SM (MISCELLANEOUS) ×3 IMPLANT
DERMABOND ADVANCED (GAUZE/BANDAGES/DRESSINGS) ×4
DERMABOND ADVANCED .7 DNX12 (GAUZE/BANDAGES/DRESSINGS) ×2 IMPLANT
DRAPE SHEET LG 3/4 BI-LAMINATE (DRAPES) ×9 IMPLANT
DRAPE STERI IOBAN 125X83 (DRAPES) ×3 IMPLANT
DRAPE U-SHAPE 47X51 STRL (DRAPES) ×6 IMPLANT
DRSG AQUACEL AG ADV 3.5X10 (GAUZE/BANDAGES/DRESSINGS) ×3 IMPLANT
ELECT PENCIL ROCKER SW 15FT (MISCELLANEOUS) ×3 IMPLANT
ELECT REM PT RETURN 15FT ADLT (MISCELLANEOUS) ×3 IMPLANT
GAUZE SPONGE 4X4 12PLY STRL (GAUZE/BANDAGES/DRESSINGS) ×3 IMPLANT
GLOVE BIO SURGEON STRL SZ8.5 (GLOVE) ×6 IMPLANT
GLOVE BIOGEL PI IND STRL 8.5 (GLOVE) ×1 IMPLANT
GLOVE BIOGEL PI INDICATOR 8.5 (GLOVE) ×2
GOWN SPEC L3 XXLG W/TWL (GOWN DISPOSABLE) ×3 IMPLANT
HANDPIECE INTERPULSE COAX TIP (DISPOSABLE) ×3
HOLDER FOLEY CATH W/STRAP (MISCELLANEOUS) ×3 IMPLANT
HOOD PEEL AWAY FLYTE STAYCOOL (MISCELLANEOUS) ×12 IMPLANT
MARKER SKIN DUAL TIP RULER LAB (MISCELLANEOUS) ×3 IMPLANT
NDL SPNL 18GX3.5 QUINCKE PK (NEEDLE) ×1 IMPLANT
NEEDLE SPNL 18GX3.5 QUINCKE PK (NEEDLE) ×3 IMPLANT
PACK ANTERIOR HIP CUSTOM (KITS) ×3 IMPLANT
SAW OSC TIP CART 19.5X105X1.3 (SAW) ×3 IMPLANT
SEALER BIPOLAR AQUA 6.0 (INSTRUMENTS) ×3 IMPLANT
SET HNDPC FAN SPRY TIP SCT (DISPOSABLE) ×1 IMPLANT
SUT ETHIBOND NAB CT1 #1 30IN (SUTURE) ×6 IMPLANT
SUT MNCRL AB 3-0 PS2 18 (SUTURE) ×3 IMPLANT
SUT MON AB 2-0 CT1 36 (SUTURE) ×8 IMPLANT
SUT STRATAFIX PDO 1 14 VIOLET (SUTURE) ×3
SUT STRATFX PDO 1 14 VIOLET (SUTURE) ×1
SUT VIC AB 2-0 CT1 27 (SUTURE) ×3
SUT VIC AB 2-0 CT1 TAPERPNT 27 (SUTURE) ×1 IMPLANT
SUTURE STRATFX PDO 1 14 VIOLET (SUTURE) ×1 IMPLANT
SYR 50ML LL SCALE MARK (SYRINGE) ×3 IMPLANT
TRAY FOLEY CATH SILVER 14FR (SET/KITS/TRAYS/PACK) ×2 IMPLANT
TRAY FOLEY W/METER SILVER 16FR (SET/KITS/TRAYS/PACK) IMPLANT
WATER STERILE IRR 1000ML POUR (IV SOLUTION) ×3 IMPLANT
YANKAUER SUCT BULB TIP 10FT TU (MISCELLANEOUS) ×3 IMPLANT

## 2017-10-10 NOTE — Anesthesia Procedure Notes (Signed)
Procedure Name: Intubation Date/Time: 10/10/2017 4:15 PM Performed by: Talbot Grumbling, CRNA Pre-anesthesia Checklist: Patient identified, Emergency Drugs available, Suction available and Patient being monitored Patient Re-evaluated:Patient Re-evaluated prior to induction Oxygen Delivery Method: Circle system utilized Preoxygenation: Pre-oxygenation with 100% oxygen Induction Type: IV induction Ventilation: Mask ventilation without difficulty Laryngoscope Size: Mac and 3 Grade View: Grade I Tube type: Oral Tube size: 7.5 mm Number of attempts: 1 Airway Equipment and Method: Stylet Placement Confirmation: ETT inserted through vocal cords under direct vision,  positive ETCO2 and breath sounds checked- equal and bilateral Secured at: 20 cm Tube secured with: Tape Dental Injury: Teeth and Oropharynx as per pre-operative assessment

## 2017-10-10 NOTE — Op Note (Signed)
OPERATIVE REPORT  SURGEON: Samson FredericBrian Blondina Coderre, MD   ASSISTANT: Lynford CitizenLela Simpson, PA-C.  PREOPERATIVE DIAGNOSIS: Right hip avascular necrosis.   POSTOPERATIVE DIAGNOSIS: Right hip avascular necrosis.  PROCEDURE: Right total hip arthroplasty, anterior approach.   IMPLANTS: DePuy Tri Lock stem, size 2, std offset. DePuy Pinnacle Cup, size 52 mm. DePuy Altrx liner, size 36 by 52 mm, neutral. DePuy Biolox ceramic head ball, size 36 + 1.5 mm.  ANESTHESIA:  General  ESTIMATED BLOOD LOSS:-400 mL    ANTIBIOTICS: 2 g Ancef.  DRAINS: None.  COMPLICATIONS: None.   CONDITION: PACU - hemodynamically stable.   BRIEF CLINICAL NOTE: Norma Ayers is a 66 y.o. female with a long-standing history of Right hip avascular necrosis. After failing conservative management, the patient was indicated for total hip arthroplasty. The risks, benefits, and alternatives to the procedure were explained, and the patient elected to proceed.  PROCEDURE IN DETAIL: Surgical site was marked by myself in the pre-op holding area. Once inside the operating room, spinal anesthesia was obtained, and a foley catheter was inserted. The patient was then positioned on the Hana table. All bony prominences were well padded. The hip was prepped and draped in the normal sterile surgical fashion. A time-out was called verifying side and site of surgery. The patient received IV antibiotics within 60 minutes of beginning the procedure.  The direct anterior approach to the hip was performed through the Hueter interval. Lateral femoral circumflex vessels were treated with the Auqumantys. The anterior capsule was exposed and an inverted T capsulotomy was made.The femoral neck cut was made to the level of the templated cut. A corkscrew was placed into the head and the head was removed. The femoral head was found to have eburnated bone. The head was passed to the back table and was measured.  Acetabular exposure was achieved, and  the pulvinar and labrum were excised. Sequential reaming of the acetabulum was then performed up to a size 51 mm reamer. A 52 mm cup was then opened and impacted into place at approximately 40 degrees of abduction and 20 degrees of anteversion. The final polyethylene liner was impacted into place and acetabular osteophytes were removed.   I then gained femoral exposure taking care to protect the abductors and greater trochanter. This was performed using standard external rotation, extension, and adduction. The capsule was peeled off the inner aspect of the greater trochanter, taking care to preserve the short external rotators. A cookie cutter was used to enter the femoral canal, and then the femoral canal finder was placed. Sequential broaching was performed up to a size 2. Calcar planer was used on the femoral neck remnant. I placed a std offset neck and a trial head ball. The hip was reduced. Leg lengths and offset were checked fluoroscopically. The hip was dislocated and trial components were removed. The final implants were placed, and the hip was reduced.  Fluoroscopy was used to confirm component position and leg lengths. At 90 degrees of external rotation and full extension, the hip was stable to an anterior directed force.  The wound was copiously irrigated with normal saline using pulse lavage. Marcaine solution was injected into the periarticular soft tissue. The wound was closed in layers using #1 Vicryl and V-Loc for the fascia, 2-0 Vicryl for the subcutaneous fat, 2-0 Monocryl for the deep dermal layer, 3-0 running Monocryl subcuticular stitch, and Dermabond for the skin. Once the glue was fully dried, an Aquacell Ag dressing was applied. The patient was transported to the  recovery room in stable condition. Sponge, needle, and instrument counts were correct at the end of the case x2. The patient tolerated the procedure well and there were no known complications.

## 2017-10-10 NOTE — Transfer of Care (Signed)
Immediate Anesthesia Transfer of Care Note  Patient: Sharmon LeydenBetty L Archibeque  Procedure(s) Performed: RIGHT TOTAL HIP ARTHROPLASTY ANTERIOR APPROACH (Right Hip)  Patient Location: PACU  Anesthesia Type:General  Level of Consciousness: sedated  Airway & Oxygen Therapy: Patient Spontanous Breathing and Patient connected to face mask oxygen  Post-op Assessment: Report given to RN and Post -op Vital signs reviewed and stable  Post vital signs: Reviewed and stable  Last Vitals:  Vitals:   10/10/17 1326  BP: (!) 199/93  Pulse: 75  Resp: 18  Temp: 37.1 C  SpO2: 100%    Last Pain:  Vitals:   10/10/17 1326  TempSrc: Oral      Patients Stated Pain Goal: 4 (10/10/17 1257)  Complications: No apparent anesthesia complications

## 2017-10-10 NOTE — Anesthesia Postprocedure Evaluation (Signed)
Anesthesia Post Note  Patient: Norma Ayers  Procedure(s) Performed: RIGHT TOTAL HIP ARTHROPLASTY ANTERIOR APPROACH (Right Hip)     Patient location during evaluation: PACU Anesthesia Type: General Level of consciousness: awake and alert Pain management: pain level controlled Vital Signs Assessment: post-procedure vital signs reviewed and stable Respiratory status: spontaneous breathing, nonlabored ventilation, respiratory function stable and patient connected to nasal cannula oxygen Cardiovascular status: blood pressure returned to baseline and stable Postop Assessment: no apparent nausea or vomiting Anesthetic complications: no    Last Vitals:  Vitals:   10/10/17 1930 10/10/17 1945  BP: 139/78   Pulse: 70 72  Resp: 12 13  Temp:  36.4 C  SpO2: 100% 100%    Last Pain:  Vitals:   10/10/17 1326  TempSrc: Oral                 Phillips Groutarignan, Raun Routh

## 2017-10-10 NOTE — Interval H&P Note (Signed)
History and Physical Interval Note:  10/10/2017 3:55 PM  Norma Ayers  has presented today for surgery, with the diagnosis of Avascular necrosis right hip  The various methods of treatment have been discussed with the patient and family. After consideration of risks, benefits and other options for treatment, the patient has consented to  Procedure(s) with comments: RIGHT TOTAL HIP ARTHROPLASTY ANTERIOR APPROACH (Right) - Needs RNFA as a surgical intervention .  The patient's history has been reviewed, patient examined, no change in status, stable for surgery.  I have reviewed the patient's chart and labs.  Questions were answered to the patient's satisfaction.     Iline OvenBrian J Jan Walters

## 2017-10-10 NOTE — Anesthesia Preprocedure Evaluation (Signed)
Anesthesia Evaluation  Patient identified by MRN, date of birth, ID band Patient awake    Reviewed: Allergy & Precautions, NPO status , Patient's Chart, lab work & pertinent test results  Airway Mallampati: II  TM Distance: >3 FB Neck ROM: Full    Dental no notable dental hx. (+) Poor Dentition, Missing   Pulmonary neg pulmonary ROS,    Pulmonary exam normal breath sounds clear to auscultation       Cardiovascular hypertension, Pt. on medications negative cardio ROS Normal cardiovascular exam Rhythm:Regular Rate:Normal     Neuro/Psych Cognitive delay negative psych ROS   GI/Hepatic negative GI ROS, (+)     substance abuse  alcohol use,   Endo/Other  negative endocrine ROS  Renal/GU negative Renal ROS  negative genitourinary   Musculoskeletal negative musculoskeletal ROS (+)   Abdominal   Peds negative pediatric ROS (+) mental retardation Hematology negative hematology ROS (+)   Anesthesia Other Findings   Reproductive/Obstetrics negative OB ROS                             Anesthesia Physical Anesthesia Plan  ASA: II  Anesthesia Plan: General   Post-op Pain Management:    Induction: Intravenous  PONV Risk Score and Plan: 3 and Ondansetron  Airway Management Planned: Oral ETT  Additional Equipment:   Intra-op Plan:   Post-operative Plan: Extubation in OR  Informed Consent: I have reviewed the patients History and Physical, chart, labs and discussed the procedure including the risks, benefits and alternatives for the proposed anesthesia with the patient or authorized representative who has indicated his/her understanding and acceptance.   Dental advisory given  Plan Discussed with: CRNA  Anesthesia Plan Comments: (Not a candidate for SAB. Developmentally delayed. EtOH abuse.)        Anesthesia Quick Evaluation

## 2017-10-10 NOTE — Op Note (Signed)
OPERATIVE REPORT  SURGEON: Samson FredericBrian Shronda Boeh, MD   ASSISTANT: Lynford CitizenLela Simpson, PA-C.  PREOPERATIVE DIAGNOSIS: Right hip avascular necrosis.   POSTOPERATIVE DIAGNOSIS: Right hip avascular necrosis.  PROCEDURE: Right total hip arthroplasty, anterior approach.   IMPLANTS: DePuy Tri Lock stem, size 2, std offset. DePuy Pinnacle Cup, size 52 mm. DePuy Altrx liner, size 36 by 52 mm, neutral. DePuy Biolox ceramic head ball, size 36 + 1.5 mm. 6.5 mm cancellous bone screw x1.  ANESTHESIA:  General  ESTIMATED BLOOD LOSS:-400 mL    ANTIBIOTICS: 2 g Ancef.  DRAINS: None.  COMPLICATIONS: None.   CONDITION: PACU - hemodynamically stable.   BRIEF CLINICAL NOTE: Norma Ayers is a [age] y.o. female with a long-standing history of Right hip avascular necrosis. After failing conservative management, the patient was indicated for total hip arthroplasty. The risks, benefits, and alternatives to the procedure were explained, and the patient elected to proceed.  PROCEDURE IN DETAIL: Surgical site was marked by myself in the pre-op holding area. Once inside the operating room, spinal anesthesia was obtained, and a foley catheter was inserted. The patient was then positioned on the Hana table. All bony prominences were well padded. The hip was prepped and draped in the normal sterile surgical fashion. A time-out was called verifying side and site of surgery. The patient received IV antibiotics within 60 minutes of beginning the procedure.  The direct anterior approach to the hip was performed through the Hueter interval. Lateral femoral circumflex vessels were treated with the Auqumantys. The anterior capsule was exposed and an inverted T capsulotomy was made.The femoral neck cut was made to the level of the templated cut. A corkscrew was placed into the head and the head was removed. The femoral head was found to have eburnated bone. The head was passed to the back table and was  measured.  Acetabular exposure was achieved, and the pulvinar and labrum were excised. Sequential reaming of the acetabulum was then performed up to a size 51 mm reamer. A 52 mm cup was then opened and impacted into place at approximately 40 degrees of abduction and 20 degrees of anteversion. The cup had excellent pressfit fixation, and I chose to place a single screw. The final polyethylene liner was impacted into place and acetabular osteophytes were removed.   I then gained femoral exposure taking care to protect the abductors and greater trochanter. This was performed using standard external rotation, extension, and adduction. The capsule was peeled off the inner aspect of the greater trochanter, taking care to preserve the short external rotators. A cookie cutter was used to enter the femoral canal, and then the femoral canal finder was placed. Sequential broaching was performed up to a size 2. Calcar planer was used on the femoral neck remnant. I placed a std offset neck and a trial head ball. The hip was reduced. Leg lengths and offset were checked fluoroscopically. The hip was dislocated and trial components were removed. The final implants were placed, and the hip was reduced.  Fluoroscopy was used to confirm component position and leg lengths. At 90 degrees of external rotation and full extension, the hip was stable to an anterior directed force.  The wound was copiously irrigated with normal saline using pulse lavage. Marcaine solution was injected into the periarticular soft tissue. The wound was closed in layers using #1 Vicryl and V-Loc for the fascia, 2-0 Vicryl for the subcutaneous fat, 2-0 Monocryl for the deep dermal layer, 3-0 running Monocryl subcuticular stitch, and Dermabond for  the skin. Once the glue was fully dried, an Aquacell Ag dressing was applied. The patient was transported to the recovery room in stable condition. Sponge, needle, and instrument counts were  correct at the end of the case x2. The patient tolerated the procedure well and there were no known complications.

## 2017-10-10 NOTE — Discharge Instructions (Signed)
Information on my medicine - ELIQUIS® (apixaban) ° °This medication education was reviewed with me or my healthcare representative as part of my discharge preparation.  °Why was Eliquis® prescribed for you? °Eliquis® was prescribed for you to reduce the risk of blood clots forming after orthopedic surgery.   ° °What do You need to know about Eliquis®? °Take your Eliquis® TWICE DAILY - one tablet in the morning and one tablet in the evening with or without food.  It would be best to take the dose about the same time each day. ° °If you have difficulty swallowing the tablet whole please discuss with your pharmacist how to take the medication safely. ° °Take Eliquis® exactly as prescribed by your doctor and DO NOT stop taking Eliquis® without talking to the doctor who prescribed the medication.  Stopping without other medication to take the place of Eliquis® may increase your risk of developing a clot. ° °After discharge, you should have regular check-up appointments with your healthcare provider that is prescribing your Eliquis®. ° °What do you do if you miss a dose? °If a dose of ELIQUIS® is not taken at the scheduled time, take it as soon as possible on the same day and twice-daily administration should be resumed.  The dose should not be doubled to make up for a missed dose.  Do not take more than one tablet of ELIQUIS at the same time. ° °Important Safety Information °A possible side effect of Eliquis® is bleeding. You should call your healthcare provider right away if you experience any of the following: °? Bleeding from an injury or your nose that does not stop. °? Unusual colored urine (red or dark Gratz) or unusual colored stools (red or black). °? Unusual bruising for unknown reasons. °? A serious fall or if you hit your head (even if there is no bleeding). ° °Some medicines may interact with Eliquis® and might increase your risk of bleeding or clotting while on Eliquis®. To help avoid this, consult your  healthcare provider or pharmacist prior to using any new prescription or non-prescription medications, including herbals, vitamins, non-steroidal anti-inflammatory drugs (NSAIDs) and supplements. ° °This website has more information on Eliquis® (apixaban): http://www.eliquis.com/eliquis/home °Dr. Brian Swinteck °Joint Replacement Specialist °[facility] °[address] °[place], [place] [address] °[phone] ° ° °TOTAL HIP REPLACEMENT POSTOPERATIVE DIRECTIONS ° ° ° °Hip Rehabilitation, Guidelines Following Surgery  ° °WEIGHT BEARING °Weight bearing as tolerated with assist device (walker, cane, etc) as directed, use it as long as suggested by your surgeon or therapist, typically at least 4-6 weeks. ° °The results of a hip operation are greatly improved after range of motion and muscle strengthening exercises. Follow all safety measures which are given to protect your hip. If any of these exercises cause increased pain or swelling in your joint, decrease the amount until you are comfortable again. Then slowly increase the exercises. Call your caregiver if you have problems or questions.  ° °HOME CARE INSTRUCTIONS  °Most of the following instructions are designed to prevent the dislocation of your new hip.  °Remove items at home which could result in a fall. This includes throw rugs or furniture in walking pathways.  °Continue medications as instructed at time of discharge. °· You may have some home medications which will be placed on hold until you complete the course of blood thinner medication. °· You may start showering once you are discharged home. Do not remove your dressing. °Do not put on socks or shoes without   following the instructions of your caregivers.   °Sit on chairs with arms. Use the chair arms to help push yourself up when arising.  °Arrange for the use of a toilet seat elevator so you are not sitting low.  °· Walk with walker as instructed.  °You may resume a sexual  relationship in one month or when given the OK by your caregiver.  °Use walker as long as suggested by your caregivers.  °You may put full weight on your legs and walk as much as is comfortable. °Avoid periods of inactivity such as sitting longer than an hour when not asleep. This helps prevent blood clots.  °You may return to work once you are cleared by your surgeon.  °Do not drive a car for 6 weeks or until released by your surgeon.  °Do not drive while taking narcotics.  °Wear elastic stockings for two weeks following surgery during the day but you may remove then at night.  °Make sure you keep all of your appointments after your operation with all of your doctors and caregivers. You should call the office at the above phone number and make an appointment for approximately two weeks after the date of your surgery. °Please pick up a stool softener and laxative for home use as long as you are requiring pain medications. °· ICE to the affected hip every three hours for 30 minutes at a time and then as needed for pain and swelling. Continue to use ice on the hip for pain and swelling from surgery. You may notice swelling that will progress down to the foot and ankle.  This is normal after surgery.  Elevate the leg when you are not up walking on it.   °It is important for you to complete the blood thinner medication as prescribed by your doctor. °· Continue to use the breathing machine which will help keep your temperature down.  It is common for your temperature to cycle up and down following surgery, especially at night when you are not up moving around and exerting yourself.  The breathing machine keeps your lungs expanded and your temperature down. ° °RANGE OF MOTION AND STRENGTHENING EXERCISES  °These exercises are designed to help you keep full movement of your hip joint. Follow your caregiver's or physical therapist's instructions. Perform all exercises about fifteen times, three times per day or as directed.  Exercise both hips, even if you have had only one joint replacement. These exercises can be done on a training (exercise) mat, on the floor, on a table or on a bed. Use whatever works the best and is most comfortable for you. Use music or television while you are exercising so that the exercises are a pleasant break in your day. This will make your life better with the exercises acting as a break in routine you can look forward to.  °Lying on your back, slowly slide your foot toward your buttocks, raising your knee up off the floor. Then slowly slide your foot back down until your leg is straight again.  °Lying on your back spread your legs as far apart as you can without causing discomfort.  °Lying on your side, raise your upper leg and foot straight up from the floor as far as is comfortable. Slowly lower the leg and repeat.  °Lying on your back, tighten up the muscle in the front of your thigh (quadriceps muscles). You can do this by keeping your leg straight and trying to raise your heel off the   floor. This helps strengthen the largest muscle supporting your knee.  °Lying on your back, tighten up the muscles of your buttocks both with the legs straight and with the knee bent at a comfortable angle while keeping your heel on the floor.  ° °SKILLED REHAB INSTRUCTIONS: °If the patient is transferred to a skilled rehab facility following release from the hospital, a list of the current medications will be sent to the facility for the patient to continue.  When discharged from the skilled rehab facility, please have the facility set up the patient's Home Health Physical Therapy prior to being released. Also, the skilled facility will be responsible for providing the patient with their medications at time of release from the facility to include their pain medication and their blood thinner medication. If the patient is still at the rehab facility at time of the two week follow up appointment, the skilled rehab  facility will also need to assist the patient in arranging follow up appointment in our office and any transportation needs. ° °MAKE SURE YOU:  °Understand these instructions.  °Will watch your condition.  °Will get help right away if you are not doing well or get worse. ° °Pick up stool softner and laxative for home use following surgery while on pain medications. °Do not remove your dressing. °The dressing is waterproof--it is OK to take showers. °Continue to use ice for pain and swelling after surgery. °Do not use any lotions or creams on the incision until instructed by your surgeon. °Total Hip Protocol. ° ° °

## 2017-10-11 ENCOUNTER — Encounter (HOSPITAL_COMMUNITY): Payer: Self-pay | Admitting: Orthopedic Surgery

## 2017-10-11 DIAGNOSIS — F418 Other specified anxiety disorders: Secondary | ICD-10-CM | POA: Insufficient documentation

## 2017-10-11 LAB — CBC
HCT: 24.8 % — ABNORMAL LOW (ref 36.0–46.0)
Hemoglobin: 7.8 g/dL — ABNORMAL LOW (ref 12.0–15.0)
MCH: 26.5 pg (ref 26.0–34.0)
MCHC: 31.5 g/dL (ref 30.0–36.0)
MCV: 84.4 fL (ref 78.0–100.0)
PLATELETS: 252 10*3/uL (ref 150–400)
RBC: 2.94 MIL/uL — AB (ref 3.87–5.11)
RDW: 13.7 % (ref 11.5–15.5)
WBC: 6.7 10*3/uL (ref 4.0–10.5)

## 2017-10-11 LAB — BASIC METABOLIC PANEL
ANION GAP: 6 (ref 5–15)
BUN: 12 mg/dL (ref 6–20)
CO2: 25 mmol/L (ref 22–32)
Calcium: 8.2 mg/dL — ABNORMAL LOW (ref 8.9–10.3)
Chloride: 107 mmol/L (ref 101–111)
Creatinine, Ser: 0.81 mg/dL (ref 0.44–1.00)
GLUCOSE: 116 mg/dL — AB (ref 65–99)
POTASSIUM: 4 mmol/L (ref 3.5–5.1)
Sodium: 138 mmol/L (ref 135–145)

## 2017-10-11 LAB — ABO/RH: ABO/RH(D): O POS

## 2017-10-11 MED ORDER — ONDANSETRON HCL 4 MG PO TABS: 4.0000 mg | ORAL_TABLET | Freq: Four times a day (QID) | ORAL | 0 refills | Status: DC | PRN

## 2017-10-11 MED ORDER — DOCUSATE SODIUM 100 MG PO CAPS: 100.0000 mg | ORAL_CAPSULE | Freq: Two times a day (BID) | ORAL | 0 refills | Status: DC

## 2017-10-11 MED ORDER — HYDROCODONE-ACETAMINOPHEN 5-325 MG PO TABS: 1.0000 | ORAL_TABLET | Freq: Four times a day (QID) | ORAL | 0 refills | Status: DC | PRN

## 2017-10-11 MED ORDER — SENNA 8.6 MG PO TABS: 2.0000 | ORAL_TABLET | Freq: Every day | ORAL | 0 refills | Status: DC

## 2017-10-11 MED ORDER — ENSURE ENLIVE PO LIQD: 237.0000 mL | Freq: Two times a day (BID) | ORAL | 12 refills | Status: DC

## 2017-10-11 MED ORDER — ENSURE ENLIVE PO LIQD
237.0000 mL | Freq: Two times a day (BID) | ORAL | Status: DC
  Administered 2017-10-11 – 2017-10-14 (×5): 237 mL via ORAL

## 2017-10-11 MED ORDER — APIXABAN 2.5 MG PO TABS: 2.5000 mg | ORAL_TABLET | Freq: Two times a day (BID) | ORAL | 0 refills | Status: DC

## 2017-10-11 MED ORDER — BOOST / RESOURCE BREEZE PO LIQD CUSTOM
1.0000 | Freq: Two times a day (BID) | ORAL | Status: DC
  Administered 2017-10-11 – 2017-10-13 (×2): 1 via ORAL

## 2017-10-11 NOTE — Evaluation (Signed)
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  PMH:  Etoh, R orbital fx, cog developmental delay   Clinical Impression   This 66 year old female was admitted for the above.  She will benefit from continued OT in acute setting as well as snf.  Pt was independent with basic adls, and family assisted with iadls/med management.  Goals are for min to mod A in acute.  Currently she needs +2 for transfers due to cognition and up to max A for ADLs   Follow Up Recommendations  SNF    Equipment Recommendations  3 in 1 bedside commode    Recommendations for Other Services       Precautions / Restrictions Precautions Precautions: Fall Restrictions Weight Bearing Restrictions: No      Mobility Bed Mobility Overal bed mobility: Needs Assistance Bed Mobility: Supine to Sit     Supine to sit: Mod assist     General bed mobility comments: assist for legs and trunk; patient assisted with moving both  Transfers Overall transfer level: Needs assistance Equipment used: Rolling walker (2 wheeled) Transfers: Sit to/from [employer] CorporationStand;Stand Pivot Transfers Sit to Stand: Min assist;From elevated surface Stand pivot transfers: Mod assist       General transfer comment: multimodal cues. Pt not safe with RW--released with one hand    Balance                                           ADL either performed or assessed with clinical judgement   ADL Overall ADL's : Needs assistance/impaired Eating/Feeding: Set up;Sitting   Grooming: Minimal assistance;Sitting   Upper Body Bathing: Sitting;Moderate assistance   Lower Body Bathing: Maximal assistance;Sit to/from stand   Upper Body Dressing : Moderate assistance;Sitting   Lower Body Dressing: Maximal assistance;Sit to/from stand   Toilet Transfer: Minimal  assistance;Stand-pivot(partially used RW to chair)   Toileting- ArchitectClothing Manipulation and Hygiene: Moderate assistance;Sit to/from stand         General ADL Comments: pt participated in limited adls but she is not thorough. Sister present in the beginning of session. Pt sat EOB and said she wanted to get to chair.  Released RW with one hand; recommend +2 for safety as she doesn't consistently follow commands     Vision         Perception     Praxis      Pertinent Vitals/Pain Pain Assessment: Faces Faces Pain Scale: Hurts even more Pain Location: R hip with movement Pain Descriptors / Indicators: Moaning;Grimacing Pain Intervention(s): Premedicated before session;Limited activity within patient's tolerance;Monitored during session;Repositioned;Ice applied     Hand Dominance     Extremity/Trunk Assessment Upper Extremity Assessment Upper Extremity Assessment: Difficult to assess due to impaired cognition           Communication Communication Communication: HOH   Cognition Arousal/Alertness: Awake/alert Behavior During Therapy: Impulsive Overall Cognitive Status: History of cognitive impairments - at baseline                                 General Comments: need +2 for safety. Pt doesn't consistently follow cues:  multimodal given.  Did not use walker correctly:  released  on hand during transfer   General Comments       Exercises     Shoulder Instructions      Home Living Family/patient expects to be discharged to:: Skilled nursing facility Living Arrangements: Alone                               Additional Comments: Pt lives alone and manages basic ADL on her own. Family assists with transportation, grocery shopping and getting meds put into pill box      Prior Functioning/Environment Level of Independence: Independent;Needs assistance    ADL's / Homemaking Assistance Needed: Pt completes basic ADL on her own per sister. Lives  at home on her own and makes simple meals. Family does grocery shopping for pt and sister sets out pills in pill box. Family checks in "pretty much every day" per sister.             OT Problem List: Decreased strength;Decreased activity tolerance;Impaired balance (sitting and/or standing);Decreased cognition;Decreased safety awareness;Decreased knowledge of use of DME or AE;Pain      OT Treatment/Interventions: Self-care/ADL training;DME and/or AE instruction;Therapeutic activities;Cognitive remediation/compensation;Patient/family education;Balance training    OT Goals(Current goals can be found in the care plan section) Acute Rehab OT Goals Patient Stated Goal: sisters:  to rehab then back to home with intermittent assistance OT Goal Formulation: With family Time For Goal Achievement: 10/25/17 Potential to Achieve Goals: Fair ADL Goals Pt Will Transfer to Toilet: with min assist;bedside commode;stand pivot transfer Pt Will Perform Toileting - Clothing Manipulation and hygiene: with min assist;sit to/from stand Additional ADL Goal #1: pt will perform UB adls from seated with supervision Additional ADL Goal #2: pt will perform LB adls with mod A, sit to stand  OT Frequency: Min 2X/week   Barriers to D/C:            Co-evaluation              AM-PAC PT "6 Clicks" Daily Activity     Outcome Measure Help from another person eating meals?: A Little Help from another person taking care of personal grooming?: A Little Help from another person toileting, which includes using toliet, bedpan, or urinal?: A Lot Help from another person bathing (including washing, rinsing, drying)?: A Lot Help from another person to put on and taking off regular upper body clothing?: A Lot Help from another person to put on and taking off regular lower body clothing?: A Lot 6 Click Score: 14   End of Session Nurse Communication: Mobility status(not following commands consistently)  Activity  Tolerance: Patient tolerated treatment well Patient left: in chair;with call bell/phone within reach;with chair alarm set  OT Visit Diagnosis: Unsteadiness on feet (R26.81);Pain Pain - Right/Left: Right Pain - part of body: Hip                Time: 0810-0829 OT Time Calculation (min): 19 min Charges:  OT General Charges $OT Visit: 1 Visit OT Evaluation $OT Eval Moderate Complexity: 1 Mod G-Codes:     Garden CityMaryellen Katrisha Segall, OTR/L [phone] 10/11/2017  Latima Hamza 10/11/2017, 9:23 AM

## 2017-10-11 NOTE — Clinical Social Work Note (Signed)
Clinical Social Work Assessment  Patient Details  Name: Norma Ayers MRN: [id number] Date of Birth: 03/31/1951  Date of referral:  10/11/17               Reason for consult:  Facility Placement                Permission sought to share information with:  Family Supports Permission granted to share information::  Yes, Verbal Permission Granted  Name::     Marland Mcalpine  Agency::     Relationship::  Sister   Contact Information:  [phone]  Housing/Transportation Living arrangements for the past 2 months:  [place] of Information:  (Sibling ) Patient Interpreter Needed:  None Criminal Activity/Legal Involvement Pertinent to Current Situation/Hospitalization:  No - Comment as needed Significant Relationships:    Lives with:  Self Do you feel safe going back to the place where you live?  Yes Need for family participation in patient care:  No (Coment)  Care giving concerns:  Patient admitted for pain and functional disability in the right hip due to arthritis.  Patient sister [name] reports the patient was finally agreeable to have hip surgery.    Social Worker assessment / plan:  Patient has cognitive delay. CSW met with patient sister daughter at beside, explain role and reason for visit. Patient alert to self and place. CSW talk with patient son [name] at bedside, explain role and reason for visit. She reports the patient has been at [facility] in the last month receiving rehab after having a fall about month ago. Patient sister reports the patient will return to [facility] SNF to continue rehab.   CSW confirm with facility liaison they will accept patient.   Plan: Return to [facility] for Rehab   Employment status:  Disabled (Comment on whether or not currently receiving Disability) Insurance information:    PT Recommendations:  [place] / Referral to community resources:  [place]  Patient/Family's Response to care:  Agreeable to SNF rehab and Responding to care.   Patient/Family's Understanding of and Emotional Response to Diagnosis, Current Treatment, and Prognosis: Patient sister [name] is POA and knowledgeable of patient care and current level of treatment.   Emotional Assessment Appearance:  Appears younger than stated age Attitude/Demeanor/Rapport:    Affect (typically observed):  Accepting, Pleasant Orientation:  Oriented to Self, Oriented to Place, Oriented to Situation Alcohol / Substance use:  Hx. Alcoholism  Psych involvement (Current and /or in the community):  No (Comment)  Discharge Needs  Concerns to be addressed:  Discharge Planning Concerns Readmission within the last 30 days:  No Current discharge risk:  Dependent with Mobility Barriers to Discharge:  Continued Medical Work up   [employer], LCSW 10/11/2017, 4:13 PM

## 2017-10-11 NOTE — Progress Notes (Signed)
   Subjective:  Patient reports pain as mild to moderate.  Denies N/V/CP/SOB.  Objective:   VITALS:   Vitals:   10/10/17 2306 10/11/17 0055 10/11/17 0506 10/11/17 0910  BP: 138/80 (!) 156/88 (!) 152/92 123/62  Pulse: 68 80 83 99  Resp: 15 16 14 14   Temp: 97.8 F (36.6 C) 97.7 F (36.5 C) 98.4 F (36.9 C) 98.5 F (36.9 C)  TempSrc: Oral Axillary Axillary Axillary  SpO2: 100% 100% 100% 99%  Weight:      Height:        NAD ABD soft Sensation intact distally Intact pulses distally Dorsiflexion/Plantar flexion intact Incision: dressing C/D/I Compartment soft   Lab Results  Component Value Date   WBC 6.7 10/11/2017   HGB 7.8 (L) 10/11/2017   HCT 24.8 (L) 10/11/2017   MCV 84.4 10/11/2017   PLT 252 10/11/2017   BMET    Component Value Date/Time   NA 138 10/11/2017 0506   NA 141 10/03/2017   K 4.0 10/11/2017 0506   CL 107 10/11/2017 0506   CO2 25 10/11/2017 0506   GLUCOSE 116 (H) 10/11/2017 0506   BUN 12 10/11/2017 0506   BUN 9 10/03/2017   CREATININE 0.81 10/11/2017 0506   CALCIUM 8.2 (L) 10/11/2017 0506   GFRNONAA >60 10/11/2017 0506   GFRAA >60 10/11/2017 0506     Assessment/Plan: 1 Day Post-Op   Principal Problem:   Avascular necrosis of hip, right (HCC)   WBAT with walker Apixaban, SCDs, TEDs PO pain control PT/OT Fall risk due to L hip AVN with collapse Dispo: D/C to SNF when bed available   Iline OvenBrian J Riker Collier 10/11/2017, 12:10 PM   Samson FredericBrian Devonne Kitchen, MD Cell [phone]

## 2017-10-11 NOTE — Progress Notes (Signed)
Physical Therapy Treatment Patient Details Name: Norma LeydenBetty L Mule MRN: [id number] DOB: 04/28/1951 Today's Date: 10/11/2017    History of Present Illness 66 yo female s/p R THA-direct anterior 10/10/17. Hx of AVN, falls, ETOH abuse, cognitive deficits.     PT Comments    Pt continues to require +2 assist for safety. Multimodal cueing required. Pt follows commands inconsistently and does not use RW properly. Appears to have significant pain when mobilizing-unsure why pt hasn't received any pain meds today. Will continue to follow. Will need SNF.     Follow Up Recommendations  SNF     Equipment Recommendations  None recommended by PT    Recommendations for Other Services OT consult     Precautions / Restrictions Precautions Precautions: Fall Restrictions Weight Bearing Restrictions: No RLE Weight Bearing: Weight bearing as tolerated    Mobility  Bed Mobility Overal bed mobility: Needs Assistance Bed Mobility: Sit to Supine       Sit to supine: HOB elevated;Mod assist   General bed mobility comments: Assist for LEs onto bed.   Transfers Overall transfer level: Needs assistance Equipment used: Rolling walker (2 wheeled) Transfers: Sit to/from Stand Sit to Stand: Min assist;+2 safety/equipment;+2 physical assistance Stand pivot transfers: Min assist;+2 safety/equipment;+2 physical assistance       General transfer comment: Multimodal cueing required. Multiple sit to stands. Pt would stand briefly, say "no", then sit abruptly. Assist to rise, stabilize control descent. Pt did not follow commands. She would not use walker properly.   Ambulation/Gait                 Stairs            Wheelchair Mobility    Modified Rankin (Stroke Patients Only)       Balance                                            Cognition Arousal/Alertness: Awake/alert Behavior During Therapy: Impulsive Overall Cognitive Status: History of cognitive  impairments - at baseline Area of Impairment: Orientation;Following commands;Safety/judgement;Problem solving                 Orientation Level: Disoriented to;Place;Time;Situation     Following Commands: Follows one step commands inconsistently Safety/Judgement: Decreased awareness of safety   Problem Solving: Difficulty sequencing;Requires verbal cues;Requires tactile cues General Comments: need +2 for safety. Pt doesn't consistently follow cues:  multimodal given.  Did not use walker correctly      Exercises      General Comments        Pertinent Vitals/Pain Pain Assessment: Faces Faces Pain Scale: Hurts whole lot Pain Location: R hip with movement Pain Descriptors / Indicators: Moaning;Grimacing;Crying Pain Intervention(s): Limited activity within patient's tolerance;Repositioned    Home Living                      Prior Function            PT Goals (current goals can now be found in the care plan section) Progress towards PT goals: Progressing toward goals    Frequency    7X/week      PT Plan Current plan remains appropriate    Co-evaluation              AM-PAC PT "6 Clicks" Daily Activity  Outcome Measure  Difficulty turning over in bed (including adjusting  bedclothes, sheets and blankets)?: Unable Difficulty moving from lying on back to sitting on the side of the bed? : Unable Difficulty sitting down on and standing up from a chair with arms (e.g., wheelchair, bedside commode, etc,.)?: Unable Help needed moving to and from a bed to chair (including a wheelchair)?: A Lot Help needed walking in hospital room?: A Lot Help needed climbing 3-5 steps with a railing? : Total 6 Click Score: 8    End of Session Equipment Utilized During Treatment: Gait belt Activity Tolerance: Patient limited by pain(limited by cognition) Patient left: in bed;with call bell/phone within reach;with bed alarm set   PT Visit Diagnosis: Muscle weakness  (generalized) (M62.81);Difficulty in walking, not elsewhere classified (R26.2)     Time: [id number] PT Time Calculation (min) (ACUTE ONLY): 8 min  Charges:  $Therapeutic Activity: 8-22 mins                    G Codes:           Rebeca AlertJannie Hasel Janish, MPT Pager: [phone]

## 2017-10-11 NOTE — Discharge Summary (Signed)
Physician Discharge Summary  Patient ID: Norma Ayers MRN: [id number] DOB/AGE: 68/02/1951 68 y.o.  Admit date: 10/10/2017 Discharge date: 10/14/2017  Admission Diagnoses:  Avascular necrosis of hip, right [facility](HCC)  Discharge Diagnoses:  Principal Problem:   Avascular necrosis of hip, right [facility](HCC)   Past Medical History:  Diagnosis Date  . Alcoholism (HCC) 09/08/2017  . Avascular necrosis of bones of both hips (HCC) 09/08/2017  . Cognitive developmental delay 09/08/2017  . Hyperlipidemia   . Hypertension   . Right orbit fracture (HCC) 09/08/2017  . Tongue lesion 09/08/2017    Surgeries: Procedure(s): RIGHT TOTAL HIP ARTHROPLASTY ANTERIOR APPROACH on 10/10/2017   Consultants (if any):   Discharged Condition: Improved  Hospital Course: Norma Ayers is an 66 y.o. female who was admitted 10/10/2017 with a diagnosis of Avascular necrosis of hip, right (HCC) and went to the operating room on 10/10/2017 and underwent the above named procedures.    She was given perioperative antibiotics:  Anti-infectives (From admission, onward)   Start     Dose/Rate Route Frequency Ordered Stop   10/10/17 2200  ceFAZolin (ANCEF) IVPB 1 g/50 mL premix     1 g 100 mL/hr over 30 Minutes Intravenous Every 6 hours 10/10/17 2013 10/11/17 0445   10/10/17 1245  ceFAZolin (ANCEF) IVPB 2g/100 mL premix     2 g 200 mL/hr over 30 Minutes Intravenous On call to O.R. 10/10/17 1239 10/10/17 1615    .  She was given sequential compression devices, early ambulation, and apixaban for DVT prophylaxis.  She benefited maximally from the hospital stay and there were no complications.    Recent vital signs:  Vitals:   10/13/17 2230 10/14/17 0606  BP: (!) 153/99 (!) 178/97  Pulse: 83 87  Resp: 18 20  Temp: 98 F (36.7 C) 98.2 F (36.8 C)  SpO2: 100% 100%    Recent laboratory studies:  Lab Results  Component Value Date   HGB 7.9 (L) 10/13/2017   HGB 7.5 (L) 10/12/2017   HGB 7.8 (L) 10/11/2017   Lab  Results  Component Value Date   WBC 7.9 10/13/2017   PLT 159 10/13/2017   No results found for: INR Lab Results  Component Value Date   NA 138 10/11/2017   K 4.0 10/11/2017   CL 107 10/11/2017   CO2 25 10/11/2017   BUN 12 10/11/2017   CREATININE 0.81 10/11/2017   GLUCOSE 116 (H) 10/11/2017    Discharge Medications:   Allergies as of 10/14/2017   No Known Allergies     Medication List    STOP taking these medications   acetaminophen 500 MG tablet Commonly known as:  TYLENOL   Lidocaine 4 % Ptch     TAKE these medications   apixaban 2.5 MG Tabs tablet Commonly known as:  ELIQUIS Take 1 tablet (2.5 mg total) by mouth every 12 (twelve) hours.   docusate sodium 100 MG capsule Commonly known as:  COLACE Take 1 capsule (100 mg total) by mouth 2 (two) times daily.   feeding supplement (ENSURE ENLIVE) Liqd Take 237 mLs by mouth 2 (two) times daily between meals.   folic acid 1 MG tablet Commonly known as:  FOLVITE Take 1 tablet (1 mg total) by mouth daily.   HYDROcodone-acetaminophen 5-325 MG tablet Commonly known as:  NORCO/VICODIN Take 1-2 tablets by mouth every 6 (six) hours as needed (hip pain).   LORazepam 0.5 MG tablet Commonly known as:  ATIVAN Give 1 tablet by mouth every morning  lovastatin 40 MG tablet Commonly known as:  MEVACOR Take 40 mg by mouth daily.   naproxen sodium 220 MG tablet Commonly known as:  ALEVE Take 220 mg by mouth 2 (two) times daily.   ondansetron 4 MG tablet Commonly known as:  ZOFRAN Take 1 tablet (4 mg total) by mouth every 6 (six) hours as needed for nausea.   senna 8.6 MG Tabs tablet Commonly known as:  SENOKOT Take 2 tablets (17.2 mg total) by mouth at bedtime.   sertraline 50 MG tablet Commonly known as:  ZOLOFT Take 50 mg by mouth daily. X 5 days, ending on 10/14/17 What changed:  Another medication with the same name was removed. Continue taking this medication, and follow the directions you see here.    sertraline 25 MG tablet Commonly known as:  ZOLOFT Take 75 mg by mouth daily. Beginning on 10/14/17 What changed:  Another medication with the same name was removed. Continue taking this medication, and follow the directions you see here.   thiamine 100 MG tablet Take 1 tablet (100 mg total) by mouth daily.       Diagnostic Studies: Dg Pelvis Portable  Result Date: 10/10/2017 CLINICAL DATA:  Status post right total hip replacement EXAM: PORTABLE PELVIS 1-2 VIEWS COMPARISON:  Pelvis CT with bony reformats September 07, 2017 FINDINGS: Frontal pelvis obtained. There is a total hip replacement on the right with prosthetic components well-seated. There is chronic bone loss inferior to the prosthesis along the lateral ischium on the right. No acute fracture or dislocation There is advanced avascular necrosis involving the left femoral head with flattening and bony remodeling. There is marked narrowing of the joint space in this area. There are fragmented calcifications within the left hip joint, stable. IMPRESSION: Prosthetic components of right total hip replacement well-seated. Loss of bone inferior to the acetabular portion the prosthesis is unchanged. Advanced avascular necrosis left femoral head with remodeling and foci of calcification within the left hip joint. No acute fracture or dislocation. Electronically Signed   By: Bretta Bang III M.D.   On: 10/10/2017 19:20   Dg C-arm 1-60 Min-no Report  Result Date: 10/10/2017 Fluoroscopy was utilized by the requesting physician.  No radiographic interpretation.   Dg Hip Operative Unilat W Or W/o Pelvis Right  Result Date: 10/10/2017 CLINICAL DATA:  Avascular necrosis of the right hip. Right total hip replacement. EXAM: OPERATIVE RIGHT HIP (WITH PELVIS IF PERFORMED) 1 VIEW TECHNIQUE: Fluoroscopic spot image(s) were submitted for interpretation post-operatively. COMPARISON:  Radiographs dated 09/07/2017 FINDINGS: AP C-arm images demonstrate  that the acetabular and femoral components of the right total hip prosthesis appear in excellent position. No appreciable fracture. IMPRESSION: Satisfactory appearance of the right total hip prosthesis in the AP projection. Electronically Signed   By: Francene Boyers M.D.   On: 10/10/2017 19:54    Disposition: 03-Skilled Nursing Facility  Discharge Instructions    Call MD / Call 911   Complete by:  As directed    If you experience chest pain or shortness of breath, CALL 911 and be transported to the hospital emergency room.  If you develope a fever above 101 F, pus (white drainage) or increased drainage or redness at the wound, or calf pain, call your surgeon's office.   Constipation Prevention   Complete by:  As directed    Drink plenty of fluids.  Prune juice may be helpful.  You may use a stool softener, such as Colace (over the counter) 100 mg twice a  day.  Use MiraLax (over the counter) for constipation as needed.   Diet - low sodium heart healthy   Complete by:  As directed    Driving restrictions   Complete by:  As directed    No driving for 6 weeks   Increase activity slowly as tolerated   Complete by:  As directed    Lifting restrictions   Complete by:  As directed    No lifting for 6 weeks   TED hose   Complete by:  As directed    Use stockings (TED hose) for 2 weeks on both leg(s).  You may remove them at night for sleeping.       Contact information for follow-up providers    Vasiliki Smaldone, Arlys JohnBrian, MD. Schedule an appointment as soon as possible for a visit in 2 weeks.   Specialty:  Orthopedic Surgery Why:  For wound re-check Contact information: [address] [place] [address] [phone]            Contact information for after-discharge care    Destination    [facility] .   Service:  Skilled Nursing Contact information: [address] [place] [id number] [phone]                    Signed: Iline OvenBrian J Brees Hounshell 10/14/2017, 7:59 AM

## 2017-10-11 NOTE — [place] FL2 (Signed)
[place] MEDICAID FL2 LEVEL OF CARE SCREENING TOOL     IDENTIFICATION  Patient Name: Norma LeydenBetty L Lavine Birthdate: 07/07/1951 Sex: female Admission Date (Current Location): 10/10/2017  [facility] [place]   Facility and Address:  [facility],  [address], [place] [id number]      Provider Number: [id number]  Attending Physician Name and Address:  Samson FredericSwinteck, Flo Berroa, MD  Relative Name and Phone Number:       Current Level of Care: Hospital Recommended Level of Care: Skilled Nursing Facility Prior Approval Number:    Date Approved/Denied:   PASRR Number: [id number]  Discharge Plan: SNF    Current Diagnoses: Patient Active Problem List   Diagnosis Date Noted  . Avascular necrosis of hip, right (HCC) 10/10/2017  . Right orbit fracture (HCC) 09/08/2017  . Alcoholism (HCC) 09/08/2017  . Tongue lesion 09/08/2017  . Cognitive developmental delay 09/08/2017  . Avascular necrosis of bones of both hips (HCC) 09/08/2017  . Hyponatremia 09/07/2017  . Hyperlipidemia     Orientation RESPIRATION BLADDER Height & Weight     Self, Place  Normal Continent, Indwelling  Weight: 50.3 kg (110 lb 14.4 oz) Height:  5\' 1"  (154.9 cm)  BEHAVIORAL SYMPTOMS/MOOD NEUROLOGICAL BOWEL NUTRITION STATUS      Continent (Regular )  AMBULATORY STATUS COMMUNICATION OF NEEDS Skin   Extensive Assist Verbally (Incision Right Hip)                       Personal Care Assistance Level of Assistance  Bathing, Feeding, Dressing Bathing Assistance: Limited assistance Feeding assistance: Independent Dressing Assistance: Limited assistance     Functional Limitations Info  Sight, Hearing, Speech Sight Info: Impaired Hearing Info: Impaired Speech Info: Impaired    SPECIAL CARE FACTORS FREQUENCY  PT (By licensed PT), OT (By licensed OT)     PT Frequency: 5x/week OT Frequency: 5x/week            Contractures Contractures Info: Not present    Additional  Factors Info  Code Status, Allergies, Psychotropic, Insulin Sliding Scale Code Status Info: Fullcode  Allergies Info: Allergies: No Known Allergies Psychotropic Info: Ativan, Zoloft         Current Medications (10/11/2017):  This is the current hospital active medication list Current Facility-Administered Medications  Medication Dose Route Frequency Provider Last Rate Last Dose  . 0.9 %  sodium chloride infusion   Intravenous Continuous Samson FredericSwinteck, Lindsy Cerullo, MD   Stopped at 10/11/17 1241  . acetaminophen (TYLENOL) tablet 650 mg  650 mg Oral Q4H PRN Samson FredericSwinteck, Claudius Mich, MD   650 mg at 10/11/17 [id number]   Or  . acetaminophen (TYLENOL) suppository 650 mg  650 mg Rectal Q4H PRN Saraia Platner, Arlys JohnBrian, MD      . alum & mag hydroxide-simeth (MAALOX/MYLANTA) 200-200-20 MG/5ML suspension 30 mL  30 mL Oral Q4H PRN Treonna Klee, Arlys JohnBrian, MD      . apixaban Everlene Balls(ELIQUIS) tablet 2.5 mg  2.5 mg Oral Q12H Samson FredericSwinteck, Jakwon Gayton, MD   2.5 mg at 10/11/17 0734  . dexamethasone (DECADRON) injection 10 mg  10 mg Intravenous Once Samson FredericSwinteck, Dorthula Bier, MD      . diphenhydrAMINE (BENADRYL) 12.5 MG/5ML elixir 12.5-25 mg  12.5-25 mg Oral Q4H PRN Allister Lessley, Arlys JohnBrian, MD      . docusate sodium (COLACE) capsule 100 mg  100 mg Oral BID Samson FredericSwinteck, Shyasia Funches, MD   100 mg at 10/11/17 0957  . feeding supplement (BOOST / RESOURCE BREEZE) liquid 1 Container  1 Container Oral  BID BM Roxanne Panek, Arlys JohnBrian, MD      . feeding supplement (ENSURE ENLIVE) (ENSURE ENLIVE) liquid 237 mL  237 mL Oral BID BM Samson FredericSwinteck, Jazmon Kos, MD   237 mL at 10/11/17 1500  . folic acid (FOLVITE) tablet 1 mg  1 mg Oral Daily Tanaya Dunigan, Arlys JohnBrian, MD   1 mg at 10/11/17 0957  . HYDROcodone-acetaminophen (NORCO/VICODIN) 5-325 MG per tablet 1 tablet  1 tablet Oral Q4H PRN Samson FredericSwinteck, Marvelle Caudill, MD   1 tablet at 10/11/17 1621  . HYDROcodone-acetaminophen (NORCO/VICODIN) 5-325 MG per tablet 2 tablet  2 tablet Oral Q4H PRN Senna Lape, Arlys JohnBrian, MD      . HYDROmorphone (DILAUDID) injection 0.5-2 mg  0.5-2 mg Intravenous Q2H PRN  Sarafina Puthoff, Arlys JohnBrian, MD      . ketorolac (TORADOL) 15 MG/ML injection 7.5 mg  7.5 mg Intravenous Q6H Allaya Abbasi, Arlys JohnBrian, MD   7.5 mg at 10/11/17 1159  . LORazepam (ATIVAN) tablet 1 mg  1 mg Oral Q6H PRN Samson FredericSwinteck, Benecio Kluger, MD       Or  . LORazepam (ATIVAN) injection 1 mg  1 mg Intravenous Q6H PRN Elvena Oyer, Arlys JohnBrian, MD      . LORazepam (ATIVAN) tablet 0.5 mg  0.5 mg Oral Daily Samson FredericSwinteck, Aahan Marques, MD   0.5 mg at 10/11/17 0957  . menthol-cetylpyridinium (CEPACOL) lozenge 3 mg  1 lozenge Oral PRN Jennamarie Goings, Arlys JohnBrian, MD       Or  . phenol (CHLORASEPTIC) mouth spray 1 spray  1 spray Mouth/Throat PRN Quincey Nored, Arlys JohnBrian, MD      . methocarbamol (ROBAXIN) tablet 500 mg  500 mg Oral Q6H PRN Samson FredericSwinteck, Semisi Biela, MD   500 mg at 10/11/17 [id number]   Or  . methocarbamol (ROBAXIN) 500 mg in dextrose 5 % 50 mL IVPB  500 mg Intravenous Q6H PRN Miliano Cotten, Arlys JohnBrian, MD      . metoCLOPramide (REGLAN) tablet 5-10 mg  5-10 mg Oral Q8H PRN Anarosa Kubisiak, Arlys JohnBrian, MD       Or  . metoCLOPramide (REGLAN) injection 5-10 mg  5-10 mg Intravenous Q8H PRN Madge Therrien, Arlys JohnBrian, MD      . multivitamin with minerals tablet 1 tablet  1 tablet Oral Daily Samson FredericSwinteck, Mahek Schlesinger, MD   1 tablet at 10/11/17 0957  . ondansetron (ZOFRAN) tablet 4 mg  4 mg Oral Q6H PRN Vic Esco, Arlys JohnBrian, MD       Or  . ondansetron (ZOFRAN) injection 4 mg  4 mg Intravenous Q6H PRN Keelynn Furgerson, Arlys JohnBrian, MD      . polyethylene glycol (MIRALAX / GLYCOLAX) packet 17 g  17 g Oral Daily PRN Taneesha Edgin, Arlys JohnBrian, MD      . pravastatin (PRAVACHOL) tablet 10 mg  10 mg Oral [id number] Samson FredericSwinteck, Zhana Jeangilles, MD   10 mg at 10/10/17 2150  . senna (SENOKOT) tablet 17.2 mg  2 tablet Oral QHS Samson FredericSwinteck, Jovie Swanner, MD   17.2 mg at 10/10/17 2149  . sertraline (ZOLOFT) tablet 50 mg  50 mg Oral Daily Samson FredericSwinteck, Amaury Kuzel, MD   50 mg at 10/11/17 0957  . [START ON 10/15/2017] sertraline (ZOLOFT) tablet 75 mg  75 mg Oral Daily Andrienne Havener, MD      . thiamine (VITAMIN B-1) tablet 100 mg  100 mg Oral Daily Samson FredericSwinteck, Cortne Amara, MD   100 mg at 10/11/17 [id number]      Discharge Medications: Please see discharge summary for a list of discharge medications.  Relevant Imaging Results:  Relevant Lab Results:   Additional Information SSN: [id number]  Jonette PesaBrian J Hilery Wintle, MD [phone]

## 2017-10-11 NOTE — Progress Notes (Signed)
Per Dr. Linna CapriceSwinteck, foley catheter will remain until tomorrow morning.

## 2017-10-11 NOTE — Progress Notes (Signed)
Initial Nutrition Assessment  DOCUMENTATION CODES:   (Will assess for malnutrition at follow-up)  INTERVENTION:  - Will order Ensure Enlive BID, each supplement provides 350 kcal and 20 grams of protein. - Continue Boost Breeze but will decrease to BID, each supplement provides 250 kcal and 9 grams of protein. - Continue to encourage PO intakes of meals and supplements.  - RD will continue to monitor for additional nutrition-related needs and will attempt to complete NFPE at follow-up.  NUTRITION DIAGNOSIS:   Increased nutrient needs related to wound healing, post-op healing as evidenced by estimated needs.  GOAL:   Patient will meet greater than or equal to 90% of their needs  MONITOR:   PO intake, Supplement acceptance, Weight trends, Labs, Skin  REASON FOR ASSESSMENT:   Malnutrition Screening Tool  ASSESSMENT:   66 y.o. female, has a history of pain and functional disability in the right hip(s) due to arthritis and patient has failed non-surgical conservative treatments for greater than 12 weeks to include NSAID's and/or analgesics, flexibility and strengthening excercises, use of assistive devices and activity modification.  Onset of symptoms was gradual starting 2 years ago with rapidlly worsening course since that time.  BMI indicates normal weight status. Unable to see/talk with pt or family x3 attempts as other healthcare team members needed that time with pt/family. Will attempt to obtain information and perform NFPE at a later date. Pt is from [facility]. Per chart review, pt consumed 50% of breakfast this AM and lunch was being delivered around the time of second attempted visit. Per chart review, pt weighed 158 lbs on 09/13/17 and is currently 110 lbs. This indicates 48 lb weight loss (30% body weight) in the past 1 month. Will need to dig into this more at follow-up as unsure that this is truly accurate, especially given current need for high-rate IVF.   Medications  reviewed; 100 mg Colace BID, daily multivitamin with minerals, 2 tablets Senokot/day, 100 mg oral thiamine/day.  Labs reviewed; Ca: 8.2 mg/dL. IVF: NS @ 150 mL/hr.     NUTRITION - FOCUSED PHYSICAL EXAM:  Unable to complete/assess and will attempt at follow-up.   Diet Order:  Diet regular Room service appropriate? Yes; Fluid consistency: Thin  EDUCATION NEEDS:   No education needs have been identified at this time  Skin:  Skin Assessment: Skin Integrity Issues: Skin Integrity Issues:: Incisions Incisions: R hip on 12/27  Last BM:  PTA/unknown  Height:   Ht Readings from Last 1 Encounters:  10/10/17 5\' 1"  (1.549 m)    Weight:   Wt Readings from Last 1 Encounters:  10/10/17 110 lb 14.4 oz (50.3 kg)    Ideal Body Weight:  47.73 kg  BMI:  Body mass index is 20.95 kg/m.  Estimated Nutritional Needs:   Kcal:  1410-1610 (28-32 kcal/kg)  Protein:  50-60 grams (1-1.2 grams/kg)  Fluid:  >/= 1.5 L/day     Trenton GammonJessica Khira Cudmore, MS, RD, LDN, [facility] Inpatient Clinical Dietitian Pager # [phone] After hours/weekend pager # [phone]

## 2017-10-11 NOTE — Evaluation (Signed)
Physical Therapy Evaluation Patient Details Name: Norma LeydenBetty L Ransford MRN: [id number] DOB: 07/27/1951 Today's Date: 10/11/2017   History of Present Illness  66 yo female s/p R THA-direct anterior 10/10/17. Hx of AVN, falls, ETOH abuse, cognitive deficits.   Clinical Impression  On eval, pt required Min assist +2 for mobility. She walked 6 feet in the room using a walker. She required Max encouragement for standing and ambulation. Multimodal cueing required. Pt is very HOH (L ear works better than R). She was tearful and at times resistant to mobilizing during session. No family present. Recommend SNF for continued rehab. Will follow and progress activity as tolerated.     Follow Up Recommendations SNF    Equipment Recommendations  None recommended by PT    Recommendations for Other Services OT consult     Precautions / Restrictions Precautions Precautions: Fall Restrictions Weight Bearing Restrictions: No RLE Weight Bearing: Weight bearing as tolerated      Mobility  Bed Mobility Overal bed mobility: Needs Assistance Bed Mobility: Supine to Sit          General bed mobility comments: oob in recliner.   Transfers Overall transfer level: Needs assistance Equipment used: Rolling walker (2 wheeled) Transfers: Sit to/from Stand Sit to Stand: Min assist;+2 physical assistance;+2 safety/equipment Stand pivot transfers: Min assist;+2 safety/equipment;+2 physical assistance       General transfer comment: Multimodal cueing required. Multiple sit to stands. Pt would stand briefly, say "no", then sit abruptly. Assist to rise, stabilize control descent. Pt would not use recliner armrests  Ambulation/Gait Ambulation/Gait assistance: Min assist;+2 physical assistance;+2 safety/equipment Ambulation Distance (Feet): 6 Feet Assistive device: Rolling walker (2 wheeled) Gait Pattern/deviations: Step-to pattern;Step-through pattern;Decreased stride length     General Gait Details:  Assist to stabilize pt and maneuver safely with walker. Multimodal cueing required. Followed closely with recliner. Pt required Max encouragement to walk. She was tearful  Information systems managertairs            Wheelchair Mobility    Modified Rankin (Stroke Patients Only)       Balance                                             Pertinent Vitals/Pain Pain Assessment: Faces Faces Pain Scale: Hurts whole lot Pain Location: R hip with movement Pain Descriptors / Indicators: Moaning;Grimacing;Crying Pain Intervention(s): Limited activity within patient's tolerance;Repositioned    Home Living Family/patient expects to be discharged to:: Skilled nursing facility Living Arrangements: Alone               Additional Comments: Pt lives alone and manages basic ADL on her own. Family assists with transportation, grocery shopping and getting meds put into pill box    Prior Function Level of Independence: Needs assistance   Gait / Transfers Assistance Needed: Ambulates with no assistive device  ADL's / Homemaking Assistance Needed: Pt completes basic ADL on her own per sister. Lives at home on her own and makes simple meals. Family does grocery shopping for pt and sister sets out pills in pill box. Family checks in "pretty much every day" per sister.   Comments: Pt with history of cognitive impairments      Hand Dominance        Extremity/Trunk Assessment   Upper Extremity Assessment Upper Extremity Assessment: Defer to OT evaluation    Lower Extremity Assessment Lower Extremity Assessment:  Difficult to assess due to impaired cognition    Cervical / Trunk Assessment Cervical / Trunk Assessment: Kyphotic  Communication   Communication: HOH(L ear works better)  Cognition Arousal/Alertness: Awake/alert Behavior During Therapy: Impulsive Overall Cognitive Status: History of cognitive impairments - at baseline Area of Impairment: Orientation;Following  commands;Safety/judgement;Problem solving                 Orientation Level: Disoriented to;Place;Time;Situation     Following Commands: Follows one step commands inconsistently Safety/Judgement: Decreased awareness of safety   Problem Solving: Difficulty sequencing;Requires verbal cues;Requires tactile cues General Comments: need +2 for safety. Pt doesn't consistently follow cues:  multimodal given.  Did not use walker correctly:  released on hand during transfer      General Comments      Exercises     Assessment/Plan    PT Assessment Patient needs continued PT services  PT Problem List Decreased strength;Decreased mobility;Decreased activity tolerance;Decreased balance;Decreased knowledge of use of DME;Pain;Decreased range of motion;Decreased cognition;Decreased knowledge of precautions       PT Treatment Interventions DME instruction;Gait training;Therapeutic exercise;Patient/family education;Therapeutic activities;Functional mobility training;Balance training    PT Goals (Current goals can be found in the Care Plan section)  Acute Rehab PT Goals Patient Stated Goal: per chart, rehab. PT Goal Formulation: Patient unable to participate in goal setting Time For Goal Achievement: 10/25/17 Potential to Achieve Goals: Good    Frequency 7X/week   Barriers to discharge        Co-evaluation               AM-PAC PT "6 Clicks" Daily Activity  Outcome Measure Difficulty turning over in bed (including adjusting bedclothes, sheets and blankets)?: Unable Difficulty moving from lying on back to sitting on the side of the bed? : Unable Difficulty sitting down on and standing up from a chair with arms (e.g., wheelchair, bedside commode, etc,.)?: Unable Help needed moving to and from a bed to chair (including a wheelchair)?: A Lot Help needed walking in hospital room?: A Lot Help needed climbing 3-5 steps with a railing? : Total 6 Click Score: 8    End of Session  Equipment Utilized During Treatment: Gait belt Activity Tolerance: (limited by cognition) Patient left: in chair;with call bell/phone within reach   PT Visit Diagnosis: Muscle weakness (generalized) (M62.81);Difficulty in walking, not elsewhere classified (R26.2)    Time: [id number] PT Time Calculation (min) (ACUTE ONLY): 13 min   Charges:   PT Evaluation $PT Eval Moderate Complexity: 1 Mod     PT G Codes:          Rebeca AlertJannie Detta Mellin, MPT Pager: [phone]

## 2017-10-12 LAB — CBC
HCT: 23.1 % — ABNORMAL LOW (ref 36.0–46.0)
HEMOGLOBIN: 7.5 g/dL — AB (ref 12.0–15.0)
MCH: 27.2 pg (ref 26.0–34.0)
MCHC: 32.5 g/dL (ref 30.0–36.0)
MCV: 83.7 fL (ref 78.0–100.0)
PLATELETS: 231 10*3/uL (ref 150–400)
RBC: 2.76 MIL/uL — AB (ref 3.87–5.11)
RDW: 13.8 % (ref 11.5–15.5)
WBC: 6.6 10*3/uL (ref 4.0–10.5)

## 2017-10-12 NOTE — Progress Notes (Signed)
Physical Therapy Treatment Patient Details Name: Sharmon LeydenBetty L Helser MRN: [id number] DOB: 01/07/1951 Today's Date: 10/12/2017    History of Present Illness 66 yo female s/p R THA-direct anterior 10/10/17. Hx of AVN, falls, ETOH abuse, cognitive deficits.     PT Comments    Pt declined OOB. She did allow therapist to perform ROM exercises. No family present during the session. Continue to recommend SNF.    Follow Up Recommendations  SNF     Equipment Recommendations  None recommended by PT    Recommendations for Other Services       Precautions / Restrictions Precautions Precautions: Fall Restrictions Weight Bearing Restrictions: No RLE Weight Bearing: Weight bearing as tolerated    Mobility  Bed Mobility               General bed mobility comments: NT-pt declined OOB. She did allow therapist to perfrom ROM exercises.   Transfers                    Ambulation/Gait                 Stairs            Wheelchair Mobility    Modified Rankin (Stroke Patients Only)       Balance                                            Cognition Arousal/Alertness: Awake/alert Behavior During Therapy: WFL for tasks assessed/performed Overall Cognitive Status: History of cognitive impairments - at baseline Area of Impairment: Orientation;Following commands;Safety/judgement;Problem solving                 Orientation Level: Disoriented to;Place;Situation     Following Commands: Follows one step commands inconsistently Safety/Judgement: Decreased awareness of safety   Problem Solving: Requires verbal cues;Requires tactile cues General Comments: need +2 for safety. Pt doesn't consistently follow cues:  multimodal given.  Did not use walker correctly      Exercises Total Joint Exercises Ankle Circles/Pumps: AROM;Both;Supine;AAROM;15 reps Heel Slides: AAROM;Right;15 reps;Supine Hip ABduction/ADduction: AAROM;Right;15  reps;Supine    General Comments        Pertinent Vitals/Pain Pain Assessment: Faces Faces Pain Scale: Hurts even more Pain Location: R hip with movement Pain Descriptors / Indicators: Grimacing Pain Intervention(s): Limited activity within patient's tolerance    Home Living                      Prior Function            PT Goals (current goals can now be found in the care plan section) Progress towards PT goals: Progressing toward goals    Frequency    7X/week      PT Plan Current plan remains appropriate    Co-evaluation              AM-PAC PT "6 Clicks" Daily Activity  Outcome Measure  Difficulty turning over in bed (including adjusting bedclothes, sheets and blankets)?: Unable Difficulty moving from lying on back to sitting on the side of the bed? : Unable Difficulty sitting down on and standing up from a chair with arms (e.g., wheelchair, bedside commode, etc,.)?: Unable Help needed moving to and from a bed to chair (including a wheelchair)?: A Lot Help needed walking in hospital room?: A Lot Help needed climbing 3-5 steps with  a railing? : Total 6 Click Score: 8    End of Session   Activity Tolerance: Patient tolerated treatment well Patient left: in bed;with call bell/phone within reach;with bed alarm set   PT Visit Diagnosis: Muscle weakness (generalized) (M62.81);Difficulty in walking, not elsewhere classified (R26.2);Pain Pain - Right/Left: Right Pain - part of body: Hip     Time: [id number] PT Time Calculation (min) (ACUTE ONLY): 8 min  Charges:  $Therapeutic Exercise: 8-22 mins                    G Codes:          Rebeca AlertJannie Rickesha Veracruz, MPT Pager: [phone]

## 2017-10-12 NOTE — Progress Notes (Signed)
Subjective: 2 Days Post-Op Procedure(s) (LRB): RIGHT TOTAL HIP ARTHROPLASTY ANTERIOR APPROACH (Right) Patient reports pain as mild.  Tolerating PO's. Denies CP, SOB, or calf pain.   Objective: Vital signs in last 24 hours: Temp:  [98.5 F (36.9 C)-100.7 F (38.2 C)] 98.5 F (36.9 C) (12/29 0415) Pulse Rate:  [99-114] 100 (12/29 0415) Resp:  [14-16] 16 (12/29 0415) BP: (123-138)/(62-74) 131/74 (12/29 0415) SpO2:  [97 %-99 %] 98 % (12/29 0415)  Intake/Output from previous day: 12/28 0701 - 12/29 0700 In: 840 [P.O.:840] Out: 550 [Urine:550] Intake/Output this shift: No intake/output data recorded.  Recent Labs    10/10/17 1318 10/11/17 0506 10/12/17 0605  HGB 10.9* 7.8* 7.5*   Recent Labs    10/11/17 0506 10/12/17 0605  WBC 6.7 6.6  RBC 2.94* 2.76*  HCT 24.8* 23.1*  PLT 252 231   Recent Labs    10/11/17 0506  NA 138  K 4.0  CL 107  CO2 25  BUN 12  CREATININE 0.81  GLUCOSE 116*  CALCIUM 8.2*   No results for input(s): LABPT, INR in the last 72 hours.  Well nourished. Alert and oriented x3. RRR, Lungs clear, BS x4. Abdomen soft and non tender. Right Calf soft and non tender. Right hip dressing C/D/I. No DVT signs. Compartment soft. No signs of infection.  Right LE grossly neurovascular intact.  Assessment/Plan: 2 Days Post-Op Procedure(s) (LRB): RIGHT TOTAL HIP ARTHROPLASTY ANTERIOR APPROACH (Right) Up with PT Plan to D/c to SNF Monday Continue current care  Jennesis Ramaswamy L 10/12/2017, 8:03 AM

## 2017-10-13 LAB — CBC
HCT: 24.9 % — ABNORMAL LOW (ref 36.0–46.0)
HEMOGLOBIN: 7.9 g/dL — AB (ref 12.0–15.0)
MCH: 26.4 pg (ref 26.0–34.0)
MCHC: 31.7 g/dL (ref 30.0–36.0)
MCV: 83.3 fL (ref 78.0–100.0)
PLATELETS: 159 10*3/uL (ref 150–400)
RBC: 2.99 MIL/uL — AB (ref 3.87–5.11)
RDW: 13.8 % (ref 11.5–15.5)
WBC: 7.9 10*3/uL (ref 4.0–10.5)

## 2017-10-13 NOTE — Progress Notes (Signed)
Subjective: 3 Days Post-Op Procedure(s) (LRB): RIGHT TOTAL HIP ARTHROPLASTY ANTERIOR APPROACH (Right) Patient reports pain as 2 on 0-10 scale.    Objective: Vital signs in last 24 hours: Temp:  [98.5 F (36.9 C)-99.5 F (37.5 C)] 98.5 F (36.9 C) (12/30 0652) Pulse Rate:  [87-98] 87 (12/30 0652) Resp:  [18-20] 18 (12/30 0652) BP: (142-148)/(64-85) 144/85 (12/30 0652) SpO2:  [100 %] 100 % (12/30 0652)  Intake/Output from previous day: 12/29 0701 - 12/30 0700 In: 690 [P.O.:690] Out: 425 [Urine:425] Intake/Output this shift: No intake/output data recorded.  Recent Labs    10/10/17 1318 10/11/17 0506 10/12/17 0605 10/13/17 0546  HGB 10.9* 7.8* 7.5* 7.9*   Recent Labs    10/12/17 0605 10/13/17 0546  WBC 6.6 7.9  RBC 2.76* 2.99*  HCT 23.1* 24.9*  PLT 231 159   Recent Labs    10/11/17 0506  NA 138  K 4.0  CL 107  CO2 25  BUN 12  CREATININE 0.81  GLUCOSE 116*  CALCIUM 8.2*   No results for input(s): LABPT, INR in the last 72 hours.  Neurologically intact Sensation intact distally Intact pulses distally Incision: dressing C/D/I  Assessment/Plan: 3 Days Post-Op Procedure(s) (LRB): RIGHT TOTAL HIP ARTHROPLASTY ANTERIOR APPROACH (Right) Advance diet Plan for discharge tomorrow  Hgb increased  Antinio Sanderfer C 10/13/2017, 8:00 AM

## 2017-10-13 NOTE — Progress Notes (Signed)
Physical Therapy Treatment Patient Details Name: Norma Ayers MRN: [id number] DOB: 10/10/1951 Today's Date: 10/13/2017    History of Present Illness 66 yo female s/p R THA-direct anterior 10/10/17. Hx of AVN, falls, ETOH abuse, cognitive deficits.     PT Comments    The patient did participate in mobility and getting  To BSC. Multimodal cues for safety but does not generally follow commands. Plans SNF.    Follow Up Recommendations  SNF     Equipment Recommendations  None recommended by PT    Recommendations for Other Services       Precautions / Restrictions Precautions Precautions: Fall Restrictions RLE Weight Bearing: Weight bearing as tolerated  HOH   Mobility  Bed Mobility Overal bed mobility: Needs Assistance Bed Mobility: Sit to Supine     Supine to sit: Mod assist Sit to supine: HOB elevated;Mod assist   General bed mobility comments: patient  with decreased following ,but indicated that she needed to go to batheroom so mobilized to sitting with mod assist and right leg.  Transfers Overall transfer level: Needs assistance Equipment used: Rolling walker (2 wheeled) Transfers: Sit to/from [employer]Stand;Stand Pivot Transfers Sit to Stand: Min assist Stand pivot transfers: Min assist       General transfer comment: Multimodal cueing required. Multiple sit to stands. Pt would stand , not holed onto the RW, attempted ambulation but brought BSC instead. Pivot  [facility]. did not use RW properly  Ambulation/Gait                 Stairs            Wheelchair Mobility    Modified Rankin (Stroke Patients Only)       Balance Overall balance assessment: Needs assistance         Standing balance support: Bilateral upper extremity supported;Single extremity supported;During functional activity Standing balance-Leahy Scale: Poor                              Cognition Arousal/Alertness: Awake/alert Behavior During Therapy:  WFL for tasks assessed/performed Overall Cognitive Status: History of cognitive impairments - at baseline Area of Impairment: Following commands;Safety/judgement;Problem solving                       Following Commands: Follows one step commands inconsistently       General Comments: is HOH mdmspeech is not clear at times, decreased ability to express self      Exercises      General Comments        Pertinent Vitals/Pain Faces Pain Scale: Hurts even more Pain Location: R hip with movement Pain Descriptors / Indicators: Grimacing Pain Intervention(s): Monitored during session;Repositioned;Ice applied;Limited activity within patient's tolerance    Home Living                      Prior Function            PT Goals (current goals can now be found in the care plan section) Progress towards PT goals: Progressing toward goals    Frequency    7X/week      PT Plan Current plan remains appropriate    Co-evaluation              AM-PAC PT "6 Clicks" Daily Activity  Outcome Measure  Difficulty turning over in bed (including adjusting bedclothes, sheets and blankets)?: Unable Difficulty moving from  lying on back to sitting on the side of the bed? : Unable Difficulty sitting down on and standing up from a chair with arms (e.g., wheelchair, bedside commode, etc,.)?: Unable Help needed moving to and from a bed to chair (including a wheelchair)?: A Lot Help needed walking in hospital room?: A Lot Help needed climbing 3-5 steps with a railing? : Total 6 Click Score: 8    End of Session Equipment Utilized During Treatment: Gait belt Activity Tolerance: Patient tolerated treatment well Patient left: in bed;with call bell/phone within reach;with bed alarm set Nurse Communication: Mobility status PT Visit Diagnosis: Muscle weakness (generalized) (M62.81);Difficulty in walking, not elsewhere classified (R26.2);Pain Pain - part of body: Hip     Time:  1451-1515 PT Time Calculation (min) (ACUTE ONLY): 24 min  Charges:  $Gait Training: 8-22 mins $Self Care/Home Management: 8-22                    G Codes:          Norma Ayers, Norma Ayers 10/13/2017, 4:09 PM

## 2017-10-14 ENCOUNTER — Other Ambulatory Visit: Payer: Self-pay

## 2017-10-14 LAB — TYPE AND SCREEN
ABO/RH(D): O POS
Antibody Screen: NEGATIVE
UNIT DIVISION: 0
Unit division: 0

## 2017-10-14 LAB — BPAM RBC
Blood Product Expiration Date: 201901192359
Blood Product Expiration Date: 201901192359
UNIT TYPE AND RH: 5100
Unit Type and Rh: 5100

## 2017-10-14 MED ORDER — HYDROCODONE-ACETAMINOPHEN 5-325 MG PO TABS: 1.0000 | ORAL_TABLET | Freq: Four times a day (QID) | ORAL | 0 refills | Status: DC | PRN

## 2017-10-14 MED ORDER — CHLORHEXIDINE GLUCONATE CLOTH 2 % EX PADS: 6.0000 | MEDICATED_PAD | Freq: Every day | CUTANEOUS | Status: DC

## 2017-10-14 MED ORDER — MUPIROCIN 2 % EX OINT: 1.0000 "application " | TOPICAL_OINTMENT | Freq: Two times a day (BID) | CUTANEOUS | Status: DC

## 2017-10-14 NOTE — Progress Notes (Signed)
Report given to SNF.

## 2017-10-14 NOTE — Progress Notes (Signed)
   Subjective:  Patient reports pain as mild to moderate.  Denies N/V/CP/SOB.  Objective:   VITALS:   Vitals:   10/13/17 0652 10/13/17 1405 10/13/17 2230 10/14/17 0606  BP: (!) 144/85 (!) 147/67 (!) 153/99 (!) 178/97  Pulse: 87 91 83 87  Resp: 18 16 18 20   Temp: 98.5 F (36.9 C) 98.2 F (36.8 C) 98 F (36.7 C) 98.2 F (36.8 C)  TempSrc: Axillary Oral Oral Oral  SpO2: 100% 98% 100% 100%  Weight:      Height:        NAD ABD soft Sensation intact distally Intact pulses distally Dorsiflexion/Plantar flexion intact Incision: dressing C/D/I Compartment soft   Lab Results  Component Value Date   WBC 7.9 10/13/2017   HGB 7.9 (L) 10/13/2017   HCT 24.9 (L) 10/13/2017   MCV 83.3 10/13/2017   PLT 159 10/13/2017   BMET    Component Value Date/Time   NA 138 10/11/2017 0506   NA 141 10/03/2017   K 4.0 10/11/2017 0506   CL 107 10/11/2017 0506   CO2 25 10/11/2017 0506   GLUCOSE 116 (H) 10/11/2017 0506   BUN 12 10/11/2017 0506   BUN 9 10/03/2017   CREATININE 0.81 10/11/2017 0506   CALCIUM 8.2 (L) 10/11/2017 0506   GFRNONAA >60 10/11/2017 0506   GFRAA >60 10/11/2017 0506     Assessment/Plan: 4 Days Post-Op   Principal Problem:   Avascular necrosis of hip, right (HCC)   WBAT with walker Apixaban, SCDs, TEDs PO pain control PT/OT Fall risk due to L hip AVN with collapse Dispo: D/C to SNF when bed available   Iline OvenBrian J Venba Zenner 10/14/2017, 7:58 AM   Samson FredericBrian Osei Anger, MD Cell [phone]

## 2017-10-14 NOTE — Progress Notes (Addendum)
[facility] CS[facility] informed patient sister Gigi Gineggy.  [facility] arranged for transport.  Nurse given number to call report.  Med. Nes. Form complete.   Vivi BarrackNicole Sumire Halbleib, Theresia MajorsLCSWA, MSW Clinical Social Worker  [phone] 10/14/2017  11:05 AM

## 2017-10-14 NOTE — Telephone Encounter (Signed)
RX faxed to [facility] @ [phone], phone number [phone] 

## 2017-10-15 DIAGNOSIS — R451 Restlessness and agitation: Secondary | ICD-10-CM | POA: Insufficient documentation

## 2017-10-16 ENCOUNTER — Encounter: Payer: Self-pay | Admitting: Adult Health

## 2017-10-16 ENCOUNTER — Non-Acute Institutional Stay (SKILLED_NURSING_FACILITY): Payer: Medicare Other | Admitting: Adult Health

## 2017-10-16 DIAGNOSIS — S0281XS Fracture of other specified skull and facial bones, right side, sequela: Secondary | ICD-10-CM | POA: Diagnosis not present

## 2017-10-16 DIAGNOSIS — F1020 Alcohol dependence, uncomplicated: Secondary | ICD-10-CM

## 2017-10-16 DIAGNOSIS — M25551 Pain in right hip: Secondary | ICD-10-CM | POA: Diagnosis not present

## 2017-10-16 DIAGNOSIS — Z96641 Presence of right artificial hip joint: Secondary | ICD-10-CM | POA: Diagnosis not present

## 2017-10-16 DIAGNOSIS — F0151 Vascular dementia with behavioral disturbance: Secondary | ICD-10-CM

## 2017-10-16 DIAGNOSIS — F418 Other specified anxiety disorders: Secondary | ICD-10-CM

## 2017-10-16 DIAGNOSIS — Z96643 Presence of artificial hip joint, bilateral: Secondary | ICD-10-CM | POA: Insufficient documentation

## 2017-10-16 DIAGNOSIS — R2689 Other abnormalities of gait and mobility: Secondary | ICD-10-CM | POA: Diagnosis not present

## 2017-10-16 DIAGNOSIS — S0285XS Fracture of orbit, unspecified, sequela: Secondary | ICD-10-CM

## 2017-10-16 DIAGNOSIS — E871 Hypo-osmolality and hyponatremia: Secondary | ICD-10-CM | POA: Diagnosis not present

## 2017-10-16 DIAGNOSIS — F01518 Vascular dementia, unspecified severity, with other behavioral disturbance: Secondary | ICD-10-CM

## 2017-10-16 DIAGNOSIS — M25552 Pain in left hip: Secondary | ICD-10-CM | POA: Diagnosis not present

## 2017-10-16 DIAGNOSIS — Z9181 History of falling: Secondary | ICD-10-CM | POA: Diagnosis not present

## 2017-10-16 DIAGNOSIS — E7800 Pure hypercholesterolemia, unspecified: Secondary | ICD-10-CM | POA: Diagnosis not present

## 2017-10-16 DIAGNOSIS — M87051 Idiopathic aseptic necrosis of right femur: Secondary | ICD-10-CM | POA: Diagnosis not present

## 2017-10-16 NOTE — Progress Notes (Signed)
Location:   [facility] Room Number: 219 B Place of Service:  SNF (31)   CODE STATUS: Full Code  No Known Allergies  Chief Complaint  Patient presents with  . Hospitalization Follow-up    Hospital Follow up    HPI:  She is a 67 year old long term resident of this facility who has been hospitalized from 10-10-17 through 10-14-17 for avascular necrosis of right hip. She did undergo a right hip replacement on 10-10-17.  She has returned. There were no complications. She is unable to participate in the hpi or ros. The staff reports that she will become agitated very easily with nursing care and therapy. There are no reports of fever; changes in appetite present. She will continue to be followed including: dyslipidemia; vascular dementia and anxiety. There are no nursing concerns at this time.   Past Medical History:  Diagnosis Date  . Alcoholism (HCC) 09/08/2017  . Avascular necrosis of bones of both hips (HCC) 09/08/2017  . Cognitive developmental delay 09/08/2017  . Hyperlipidemia   . Hypertension   . Right orbit fracture (HCC) 09/08/2017  . Tongue lesion 09/08/2017    Past Surgical History:  Procedure Laterality Date  . gum tumor     removed  . TOTAL HIP ARTHROPLASTY Right 10/10/2017   Procedure: RIGHT TOTAL HIP ARTHROPLASTY ANTERIOR APPROACH;  Surgeon: Samson Frederic, MD;  Location: [facility];  Service: Orthopedics;  Laterality: Right;  Needs RNFA    Social History   Socioeconomic History  . Marital status: Widowed    Spouse name: Not on file  . Number of children: Not on file  . Years of education: Not on file  . Highest education level: Not on file  Social Needs  . Financial resource strain: Not on file  . Food insecurity - worry: Not on file  . Food insecurity - inability: Not on file  . Transportation needs - medical: Not on file  . Transportation needs - non-medical: Not on file  Occupational History  . Not on file  Tobacco Use  . Smoking status:  Never Smoker  . Smokeless tobacco: Never Used  Substance and Sexual Activity  . Alcohol use: Yes    Comment: 2 times per week  . Drug use: No  . Sexual activity: Not on file  Other Topics Concern  . Not on file  Social History Narrative  . Not on file   Family History  Problem Relation Age of Onset  . Emphysema Mother   . Hypertension Sister   . Heart attack Brother       VITAL SIGNS BP 130/70   Pulse 88   Temp 98.1 F (36.7 C)   Resp 18   Ht 5\' 1"  (1.549 m)   Wt 112 lb 4.8 oz (50.9 kg)   SpO2 98%   BMI 21.22 kg/m   Outpatient Encounter Medications as of 10/16/2017  Medication Sig  . apixaban (ELIQUIS) 2.5 MG TABS tablet Take 1 tablet (2.5 mg total) by mouth every 12 (twelve) hours.  . docusate sodium (COLACE) 100 MG capsule Take 1 capsule (100 mg total) by mouth 2 (two) times daily.  . folic acid (FOLVITE) 1 MG tablet Take 1 tablet (1 mg total) by mouth daily.  Marland Kitchen HYDROcodone-acetaminophen (NORCO/VICODIN) 5-325 MG tablet Take 1-2 tablets by mouth every 6 (six) hours as needed (hip pain).  Marland Kitchen lidocaine (LIDODERM) 5 % Apply 1 patch to Right Hip topically one time a day. Remove & Discard patch within 12  hours or as directed by MD  . LORazepam (ATIVAN) 0.5 MG tablet Give 1 tablet by mouth every morning  . lovastatin (MEVACOR) 40 MG tablet Take 40 mg by mouth daily.  . naproxen sodium (ALEVE) 220 MG tablet Take 440 mg by mouth 2 (two) times daily.   . Nutritional Supplements (NUTRITIONAL SUPPLEMENT PO) House Supplement -Give bu mouth two times daily for Health Supplement  . ondansetron (ZOFRAN) 4 MG tablet Take 1 tablet (4 mg total) by mouth every 6 (six) hours as needed for nausea.  Marland Kitchen. senna (SENOKOT) 8.6 MG TABS tablet Take 2 tablets (17.2 mg total) by mouth at bedtime.  . sertraline (ZOLOFT) 25 MG tablet Take 75 mg by mouth daily. Beginning on 10/14/17  . thiamine 100 MG tablet Take 1 tablet (100 mg total) by mouth daily.  . sertraline (ZOLOFT) 50 MG tablet Take 50 mg by  mouth daily. X 5 days, ending on 10/14/17  . [DISCONTINUED] feeding supplement, ENSURE ENLIVE, (ENSURE ENLIVE) LIQD Take 237 mLs by mouth 2 (two) times daily between meals. (Patient not taking: Reported on 10/16/2017)   No facility-administered encounter medications on file as of 10/16/2017.      SIGNIFICANT DIAGNOSTIC EXAMS   PREVIOUS:   09-07-17: chest x-ray: No active disease.   09-07-17: ct of head cervical spine; maxillofacial:  1. Acute right orbital floor and medial orbital wall fractures with fracture involving the canal for the right maxillary nerve seen along the medial orbital floor. There is 3 mm of caudal displacement of the orbital floor fracture with herniation of periorbital fat and minimal herniation of the right maxillary nerve. 2. Associated with the right orbital fractures are a small amount of medial extraconal hemorrhage/fluid as well as small amount of lateral intraconal hemorrhage. Hyperdense fluid in the right maxillary sinus would be in keeping with hemorrhage/blood products. Right periorbital soft tissue swelling is noted. 3. No acute intracranial abnormality. 4. Mild cervical spondylosis without acute cervical spine fracture.  09-07-17: right hip x-ray:  1. No acute abnormality of the bony pelvis or the right hip. 2. 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  09-07-17: ct of pelvis:  1. No acute fracture of the pelvis or hips. 2. Bilateral hip AVN and advanced osteoarthritis, findings have progressed in the right hip from CT 6 months prior.  TODAY:   10-10-17: pelvic x-ray: Prosthetic components of right total hip replacement well-seated. Loss of bone inferior to the acetabular portion the prosthesis is unchanged. Advanced avascular necrosis left femoral head with remodeling and foci  of calcification within the left hip joint. No acute fracture or dislocation.    LABS REVIEWED: PREVOUS:   09-07-17: wbc 7.5; hgb 10.8; hct 30.4; mcv 76.2; plt 298; glucose 105; bun 21; creat 1.18; k+ 3.2; na++ 116; ca 8.9 ck 437; tsh 1.378 09-09-17: glucose 98; bun 9; creat 0.82; k+ 4.1; na++ 115; ca 8.0; mag 1.5; phos 1.9; uric 4.2 09-10-17: wbc 7.7; hgb 9.6; hct 26.6; mcv 75.6 plt 260; glucose 121; bun <5; creat 0.69; k+ 3.4; na++ 112; ca 8.0 09-11-17: wbc 7.1; hgb 9.1; hct 25.8; mcv 75.9; plt 277; glucose 88; bun <5; creat 0.67; k+ 3.5; na++ 118; ca 8.601mag 1.2; phos 3.5  09-11-17: (repeat) glucose 120; bun 6; creat 0.83; k+ 3.8; na++ 130; ca 8.1 09-12-17: wbc 6.9; hgb 8.9; hct 25.2; mcv 78.5; plt 275; glucose  104; bun 6; creat 0.87; k+ 4.1; na++ 130; ca 8.6 10-03-17: glucose 94; bun 9.1; creat 0.77; k+ 4.5; na++ 141; ca 9.4; liver normal albumin 3.6; mag 1.6  TODAY:   10-10-17: wbc 7.0; hgb 10.9; hct 34.7; mcv 84.0; plt 360 10-11-17: glucose 116; bun 12; creat 0.81 ;k+ 4.0; na++ 138; ca 8.2 10-13-17: wbc 7.9; hgb 7.9; hct 24.9; mcv 83.3; plt 159     Review of Systems  Unable to perform ROS: Dementia (unable to participate )    Physical Exam  Constitutional: No distress.  frail  Neck: No thyromegaly present.  Cardiovascular: Normal rate, regular rhythm and intact distal pulses.  Murmur heard. 1/6  Pulmonary/Chest: Effort normal and breath sounds normal. No respiratory distress.  Abdominal: Soft. Bowel sounds are normal. She exhibits no distension. There is no tenderness.  Musculoskeletal: She exhibits no edema.  Is able to move all extremities Status post right hip   Lymphadenopathy:    She has no cervical adenopathy.  Neurological: She is alert.  Skin: Skin is warm and dry. She is not diaphoretic.  Incision line without signs of infection present   Psychiatric:  Is  Easily agitated       ASSESSMENT/ PLAN:  TODAY:   1. Avascular necrosis of both hips: is status  post right hip replacement will continue therapy as directed; will follow up with orthopedics as indicated. Will continue aleve 440 mg twice daily lidoderm patch to right hip; and vicodin 5/35 mg 1 or 2 tabs every 6 hours as needed  Will continue eliquis 2.5 mg twice daily for 30 days then stop   2. Right orbit fracture: will have her follow up with Dr. Harvel Quale in the next 3 weeks. Will monitor   3. Severe hyponatremia: her na++ level 138 upon discharge from hospital. Will continue to monitor her status.   4.  Cognitive delay with vascular dementia: is currently not on medications; will not make changes at this time and will address as indicated current weight is 112 pounds   5. Dyslipidemia: stable will continue mevacor 20 mg daily   6. Alcoholism: is currently not consuming alcohol no signs of withdrawal present;will continue folic acid and thiamine  7. Depression with anxiety: is without change: will continue ativan 0.5 mg daily does take anxiety; and zoloft 75 mg daily     MD is aware of resident's narcotic use and is in agreement with current plan of care. We will attempt to wean resident as apropriate     Synthia Innocent NP [facility]  Contact [phone] Monday through Friday 8am- 5pm  After hours call [phone]

## 2017-10-17 ENCOUNTER — Non-Acute Institutional Stay (SKILLED_NURSING_FACILITY): Payer: Medicare Other | Admitting: Internal Medicine

## 2017-10-17 ENCOUNTER — Encounter: Payer: Self-pay | Admitting: Internal Medicine

## 2017-10-17 ENCOUNTER — Non-Acute Institutional Stay (SKILLED_NURSING_FACILITY): Payer: Medicare Other

## 2017-10-17 DIAGNOSIS — D649 Anemia, unspecified: Secondary | ICD-10-CM

## 2017-10-17 DIAGNOSIS — Z0000 Encounter for general adult medical examination without abnormal findings: Secondary | ICD-10-CM | POA: Diagnosis not present

## 2017-10-17 DIAGNOSIS — E7800 Pure hypercholesterolemia, unspecified: Secondary | ICD-10-CM

## 2017-10-17 DIAGNOSIS — F1020 Alcohol dependence, uncomplicated: Secondary | ICD-10-CM | POA: Diagnosis not present

## 2017-10-17 DIAGNOSIS — F418 Other specified anxiety disorders: Secondary | ICD-10-CM

## 2017-10-17 DIAGNOSIS — Z9889 Other specified postprocedural states: Secondary | ICD-10-CM

## 2017-10-17 DIAGNOSIS — F0151 Vascular dementia with behavioral disturbance: Secondary | ICD-10-CM | POA: Diagnosis not present

## 2017-10-17 DIAGNOSIS — F01518 Vascular dementia, unspecified severity, with other behavioral disturbance: Secondary | ICD-10-CM

## 2017-10-17 DIAGNOSIS — M25551 Pain in right hip: Secondary | ICD-10-CM | POA: Diagnosis not present

## 2017-10-17 DIAGNOSIS — M25552 Pain in left hip: Secondary | ICD-10-CM | POA: Diagnosis not present

## 2017-10-17 DIAGNOSIS — Z9181 History of falling: Secondary | ICD-10-CM | POA: Diagnosis not present

## 2017-10-17 DIAGNOSIS — R2689 Other abnormalities of gait and mobility: Secondary | ICD-10-CM | POA: Diagnosis not present

## 2017-10-17 NOTE — Progress Notes (Signed)
Subjective:   Norma Ayers is a 67 y.o. female who presents for Medicare Annual (Subsequent) preventive examination [facility]; incapacitated patient unable to answer questions appropriately   Last AWV-01/2013       Objective:     Vitals: BP (!) 152/78 (BP Location: Right Arm, Patient Position: Supine)   Pulse 89   Temp 97.8 F (36.6 C) (Oral)   Ht 5\' 1"  (1.549 m)   Wt 112 lb (50.8 kg)   SpO2 98%   BMI 21.16 kg/m   Body mass index is 21.16 kg/m.  Advanced Directives 10/17/2017 10/17/2017 10/16/2017 10/10/2017 10/09/2017 10/04/2017 09/26/2017  Does Patient Have a Medical Advance Directive? No No Yes Yes Yes No No  Type of Advance Directive Healthcare Power of Attorney - - Healthcare Power of [employer] Healthcare Power of Attorney - -  Does patient want to make changes to medical advance directive? No - Patient declined No - Patient declined No - Patient declined No - Patient declined - - -  Copy of Healthcare Power of Attorney in Chart? No - copy requested - - No - copy requested Yes - -  Would patient like information on creating a medical advance directive? No - Patient declined - No - Patient declined No - Patient declined - No - Patient declined No - Patient declined    Tobacco Social History   Tobacco Use  Smoking Status Never Smoker  Smokeless Tobacco Never Used     Counseling given: Not Answered   Clinical Intake:  Pre-visit preparation completed: No  Pain : No/denies pain     Nutritional Status: BMI of 19-24  Normal Nutritional Risks: None Diabetes: No  How often do you need to have someone help you when you read instructions, pamphlets, or other written materials from your doctor or pharmacy?: 3 - Sometimes  Interpreter Needed?: No  Information entered by :: Tyron RussellSara Saunders, RN  Past Medical History:  Diagnosis Date  . Alcoholism (HCC) 09/08/2017  . Avascular necrosis of bones of both hips (HCC) 09/08/2017  . Cognitive developmental  delay 09/08/2017  . Hyperlipidemia   . Hypertension   . Right orbit fracture (HCC) 09/08/2017  . Tongue lesion 09/08/2017   Past Surgical History:  Procedure Laterality Date  . gum tumor     removed  . TOTAL HIP ARTHROPLASTY Right 10/10/2017   Procedure: RIGHT TOTAL HIP ARTHROPLASTY ANTERIOR APPROACH;  Surgeon: Samson FredericSwinteck, Brian, MD;  Location: [facility];  Service: Orthopedics;  Laterality: Right;  Needs RNFA   Family History  Problem Relation Age of Onset  . Emphysema Mother   . Hypertension Sister   . Heart attack Brother    Social History   Socioeconomic History  . Marital status: Widowed    Spouse name: None  . Number of children: None  . Years of education: None  . Highest education level: None  Social Needs  . Financial resource strain: None  . Food insecurity - worry: None  . Food insecurity - inability: None  . Transportation needs - medical: None  . Transportation needs - non-medical: None  Occupational History  . None  Tobacco Use  . Smoking status: Never Smoker  . Smokeless tobacco: Never Used  Substance and Sexual Activity  . Alcohol use: Yes    Comment: 2 times per week  . Drug use: No  . Sexual activity: None  Other Topics Concern  . None  Social History Narrative  . None    Outpatient Encounter  Medications as of 10/17/2017  Medication Sig  . apixaban (ELIQUIS) 2.5 MG TABS tablet Take 1 tablet (2.5 mg total) by mouth every 12 (twelve) hours.  . docusate sodium (COLACE) 100 MG capsule Take 1 capsule (100 mg total) by mouth 2 (two) times daily.  . folic acid (FOLVITE) 1 MG tablet Take 1 tablet (1 mg total) by mouth daily.  . Hydrocodone-Acetaminophen 5-300 MG TABS Take 1 tablet by mouth every 6 (six) hours as needed. Give 1 tablet with meals  . lidocaine (LIDODERM) 5 % Apply 1 patch to Right Hip topically one time a day. Remove & Discard patch within 12 hours or as directed by MD  . LORazepam (ATIVAN) 0.5 MG tablet Give 1 tablet by mouth every morning  .  lovastatin (MEVACOR) 40 MG tablet Take 40 mg by mouth daily.  . naproxen sodium (ALEVE) 220 MG tablet Take 440 mg by mouth 2 (two) times daily.   . Nutritional Supplements (NUTRITIONAL SUPPLEMENT PO) House Supplement -Give bu mouth two times daily for Health Supplement  . ondansetron (ZOFRAN) 4 MG tablet Take 1 tablet (4 mg total) by mouth every 6 (six) hours as needed for nausea.  Marland Kitchen senna (SENOKOT) 8.6 MG TABS tablet Take 2 tablets (17.2 mg total) by mouth at bedtime.  . sertraline (ZOLOFT) 25 MG tablet Take 75 mg by mouth daily. Beginning on 10/14/17  . thiamine 100 MG tablet Take 1 tablet (100 mg total) by mouth daily.  . sertraline (ZOLOFT) 50 MG tablet Take 50 mg by mouth daily. X 5 days, ending on 10/14/17   No facility-administered encounter medications on file as of 10/17/2017.     Activities of Daily Living In your present state of health, do you have any difficulty performing the following activities: 10/17/2017 10/10/2017  Hearing? Malvin Johns  Vision? N N  Difficulty concentrating or making decisions? Malvin Johns  Walking or climbing stairs? Y Y  Dressing or bathing? Y Y  Doing errands, shopping? Malvin Johns  Preparing Food and eating ? Y -  Using the Toilet? Y -  In the past six months, have you accidently leaked urine? Y -  Do you have problems with loss of bowel control? Y -  Managing your Medications? Y -  Managing your Finances? Y -  Housekeeping or managing your Housekeeping? Y -  Some recent data might be hidden    Patient Care Team: Richmond Campbell PA-C as PCP - General (Family Medicine)    Assessment:   This is a routine wellness examination for Norma Ayers.  Exercise Activities and Dietary recommendations Current Exercise Habits: The patient does not participate in regular exercise at present, Exercise limited by: orthopedic condition(s)  Goals    None      Fall Risk Fall Risk  10/17/2017  Falls in the past year? No   Is the patient's home free of loose throw rugs in walkways,  pet beds, electrical cords, etc?   yes      Grab bars in the bathroom? yes      Handrails on the stairs?   yes      Adequate lighting?   yes  Timed Get Up and Go performed: unable to perform, pt is unambulatory  Depression Screen PHQ 2/9 Scores 10/17/2017  Exception Documentation Medical reason     Cognitive Function MMSE - Mini Mental State Exam 10/17/2017  Not completed: Unable to complete        Immunization History  Administered Date(s) Administered  . Influenza-Unspecified  05/15/2017  . Pneumococcal-Unspecified 04/18/2014  . Tdap 09/28/2014    Qualifies for Shingles Vaccine? Yes, no past records of shingles vaccine and pt unable to communicate   Screening Tests Health Maintenance  Topic Date Due  . MAMMOGRAM  10/17/2017 (Originally 06/18/2014)  . DEXA SCAN  10/17/2017 (Originally 04/18/2014)  . COLONOSCOPY  10/17/2017 (Originally 04/19/1999)  . PNA vac Low Risk Adult (2 of 2 - PCV13) 10/17/2017 (Originally 04/19/2015)  . Hepatitis C Screening  09/16/2018 (Originally 26-Apr-1951)  . TETANUS/TDAP  09/28/2024  . INFLUENZA VACCINE  Completed    Cancer Screenings: Lung: Low Dose CT Chest recommended if Age 63-80 years, 30 pack-year currently smoking OR have quit w/in 15years. Patient does not qualify. Breast:  Up to date on Mammogram? No Up to date of Bone Density/Dexa? no Colorectal: No  Additional Screenings:  Hepatitis B/HIV/Syphillis: Not indicated Hepatitis C Screening: Due     Plan:    I have personally reviewed and addressed the Medicare Annual Wellness questionnaire and have noted the following in the patient's chart:  A. Medical and social history B. Use of alcohol, tobacco or illicit drugs  C. Current medications and supplements D. Functional ability and status E.  Nutritional status F.  Physical activity G. Advance directives H. List of other physicians I.  Hospitalizations, surgeries, and ER visits in previous 12 months J.  Vitals K. Screenings to include  hearing, vision, cognitive, depression L. Referrals and appointments - none  In addition, I am unable to review and discuss with incapacitated patient certain preventive protocols, quality metrics, and best practice recommendations. A written personalized care plan for preventive services as well as general preventive health recommendations were provided to patient.   See attached scanned questionnaire for additional information.   Signed,   Tyron Russell, RN Nurse Health Advisor   Quick Notes   Health Maintenance: Hep C scree, PNA 13, mammogram, colonoscopy and DEXA due.     Abnormal Screen: Unable to complete mental exam     Patient Concerns: None     Nurse Concerns: None

## 2017-10-17 NOTE — Progress Notes (Signed)
Patient ID: Norma Ayers, female   DOB: 01-01-1951, 67 y.o.   MRN: [id number]   Provider:  DR Elmon Kirschner Location:  [facility] Room Number: 219 B Place of Service:  SNF (31)  PCP: Richmond Campbell., PA-C Patient Care Team: Gala Lewandowsky as PCP - General (Family Medicine)  Extended Emergency Contact Information Primary Emergency Contact: Gabriel Cirri [place] Home Phone: [phone] Mobile Phone: [phone] Relation: Sister Secondary Emergency Contact: Bettye Boeck [place] Home Phone: [phone] Mobile Phone: [phone] Relation: Sister  Code Status: FULL CODE Goals of Care: Advanced Directive information Advanced Directives 10/17/2017  Does Patient Have a Medical Advance Directive? No  Type of Advance Directive -  Does patient want to make changes to medical advance directive? No - Patient declined  Copy of Healthcare Power of Attorney in Chart? -  Would patient like information on creating a medical advance directive? -      Chief Complaint  Patient presents with  . Readmit To SNF    [facility]    HPI: Patient is a 67 y.o. female seen today for readmission to SNF following hospital stay for right hip AVN and postop anemia. She underwent right THA on 10/10/17 by Dr Linna Caprice. Hgb dropped from 109-->7.5-->7.9 at d/c. No PRBCs req'd. She presents to SNF for short term rehab -->long term care.  Today she reports no concerns. Pain stable on aleve 440 mg twice daily, lidoderm patch to right hip, and vicodin 5/35 mg 1 or 2 tabs every 6 hours as needed. She takes eliquis for DVT prophylaxis.  Right orbit fracture - s/p fall in Nov 2018; followed by ophthamology Dr. Harvel Quale  Severe hyponatremia - stable; Na 138  Cognitive delay/ vascular dementia - currently not on any medications; weight stable; B12 level 406  Dyslipidemia - stable on mevacor 20 mg daily   Alcoholism - no signs of withdrawal  present; takes folic acid and thiamine  Depression with anxiety - mood stable on ativan 0.5 mg daily; zoloft 75 mg daily; she does benefit from this regimen  Past Medical History:  Diagnosis Date  . Alcoholism (HCC) 09/08/2017  . Avascular necrosis of bones of both hips (HCC) 09/08/2017  . Cognitive developmental delay 09/08/2017  . Hyperlipidemia   . Hypertension   . Right orbit fracture (HCC) 09/08/2017  . Tongue lesion 09/08/2017   Past Surgical History:  Procedure Laterality Date  . gum tumor     removed  . TOTAL HIP ARTHROPLASTY Right 10/10/2017   Procedure: RIGHT TOTAL HIP ARTHROPLASTY ANTERIOR APPROACH;  Surgeon: Samson Frederic, MD;  Location: [facility];  Service: Orthopedics;  Laterality: Right;  Needs RNFA    reports that  has never smoked. she has never used smokeless tobacco. She reports that she drinks alcohol. She reports that she does not use drugs. Social History   Socioeconomic History  . Marital status: Widowed    Spouse name: Not on file  . Number of children: Not on file  . Years of education: Not on file  . Highest education level: Not on file  Social Needs  . Financial resource strain: Not on file  . Food insecurity - worry: Not on file  . Food insecurity - inability: Not on file  . Transportation needs - medical: Not on file  . Transportation needs - non-medical: Not on file  Occupational History  . Not on file  Tobacco Use  . Smoking status: Never Smoker  .  Smokeless tobacco: Never Used  Substance and Sexual Activity  . Alcohol use: Yes    Comment: 2 times per week  . Drug use: No  . Sexual activity: Not on file  Other Topics Concern  . Not on file  Social History Narrative  . Not on file    Functional Status Survey:    Family History  Problem Relation Age of Onset  . Emphysema Mother   . Hypertension Sister   . Heart attack Brother     Health Maintenance  Topic Date Due  . MAMMOGRAM  10/17/2017 (Originally 06/18/2014)  . DEXA SCAN   10/17/2017 (Originally 04/18/2014)  . COLONOSCOPY  10/17/2017 (Originally 04/19/1999)  . PNA vac Low Risk Adult (2 of 2 - PCV13) 10/17/2017 (Originally 04/19/2015)  . Hepatitis C Screening  09/16/2018 (Originally 02/05/1951)  . TETANUS/TDAP  09/28/2024  . INFLUENZA VACCINE  Completed    No Known Allergies  Outpatient Encounter Medications as of 10/17/2017  Medication Sig  . apixaban (ELIQUIS) 2.5 MG TABS tablet Take 1 tablet (2.5 mg total) by mouth every 12 (twelve) hours.  . docusate sodium (COLACE) 100 MG capsule Take 1 capsule (100 mg total) by mouth 2 (two) times daily.  . folic acid (FOLVITE) 1 MG tablet Take 1 tablet (1 mg total) by mouth daily.  . Hydrocodone-Acetaminophen 5-300 MG TABS Take 1 tablet by mouth every 6 (six) hours as needed. Give 1 tablet with meals  . lidocaine (LIDODERM) 5 % Apply 1 patch to Right Hip topically one time a day. Remove & Discard patch within 12 hours or as directed by MD  . LORazepam (ATIVAN) 0.5 MG tablet Give 1 tablet by mouth every morning  . lovastatin (MEVACOR) 40 MG tablet Take 40 mg by mouth daily.  . naproxen sodium (ALEVE) 220 MG tablet Take 440 mg by mouth 2 (two) times daily.   . Nutritional Supplements (NUTRITIONAL SUPPLEMENT PO) House Supplement -Give bu mouth two times daily for Health Supplement  . ondansetron (ZOFRAN) 4 MG tablet Take 1 tablet (4 mg total) by mouth every 6 (six) hours as needed for nausea.  Marland Kitchen senna (SENOKOT) 8.6 MG TABS tablet Take 2 tablets (17.2 mg total) by mouth at bedtime.  . sertraline (ZOLOFT) 25 MG tablet Take 75 mg by mouth daily. Beginning on 10/14/17  . thiamine 100 MG tablet Take 1 tablet (100 mg total) by mouth daily.  . sertraline (ZOLOFT) 50 MG tablet Take 50 mg by mouth daily. X 5 days, ending on 10/14/17  . [DISCONTINUED] HYDROcodone-acetaminophen (NORCO/VICODIN) 5-325 MG tablet Take 1-2 tablets by mouth every 6 (six) hours as needed (hip pain). (Patient not taking: Reported on 10/17/2017)   No  facility-administered encounter medications on file as of 10/17/2017.     Review of Systems  Unable to perform ROS: Dementia    Vitals:   10/17/17 1001  BP: 130/70  Pulse: 88  Resp: 18  Temp: 98.1 F (36.7 C)  TempSrc: Oral  SpO2: 98%  Weight: 112 lb 4.8 oz (50.9 kg)  Height: 5\' 1"  (1.549 m)   Body mass index is 21.22 kg/m. Physical Exam  Constitutional: She appears well-developed.  Frail appearing in NAD, sitting up in bed resting  HENT:  Mouth/Throat: Oropharynx is clear and moist. No oropharyngeal exudate.  MMM; no oral thrush  Eyes: Pupils are equal, round, and reactive to light. No scleral icterus.  Neck: Neck supple. Carotid bruit is not present. No tracheal deviation present.  Cardiovascular: Normal rate, regular rhythm and  intact distal pulses. Exam reveals no gallop and no friction rub.  Murmur (1/6 SEM) heard. Trace L>RLE edema. No calf TTP or palpable cord  Pulmonary/Chest: Effort normal and breath sounds normal. No stridor. No respiratory distress. She has no wheezes. She has no rales.  Abdominal: Soft. Normal appearance and bowel sounds are normal. She exhibits distension. She exhibits no mass. There is no hepatomegaly. There is no tenderness. There is no rigidity, no rebound and no guarding. No hernia.  Musculoskeletal: She exhibits edema (right anterior thigh). She exhibits no tenderness.  Lymphadenopathy:    She has no cervical adenopathy.  Neurological: She is alert.  Skin: Skin is warm and dry. No rash noted.  Right proximal anterior thigh dsg c/d/i, increased warmth touch but no redness or TTP  Psychiatric: She has a normal mood and affect. Her behavior is normal.    Labs reviewed: Basic Metabolic Panel: Recent Labs    09/09/17 0524  09/09/17 1904  09/11/17 0406 09/11/17 1826 09/11/17 2224 09/12/17 1204 10/03/17 10/11/17 0506  NA 115*   < > 114*   < > 118* 128* 130* 130* 141 138  K 4.1   < > 3.7   < > 3.5 3.9 3.8 4.1 4.5 4.0  CL 87*   < > 85*    < > 89* 100* 101 101  --  107  CO2 22   < > 22   < > 22 23 23 24   --  25  GLUCOSE 98   < > 96   < > 88 138* 120* 104*  --  116*  BUN 9   < > 7   < > <5* 5* 6 6 9 12   CREATININE 0.82   < > 0.73   < > 0.67 0.84 0.83 0.87 0.8 0.81  CALCIUM 8.0*   < > 7.7*   < > 8.1* 8.0* 8.1* 8.6*  --  8.2*  MG 1.5*  --  2.2  --  1.2* 2.1  --   --   --   --   PHOS 1.9*  --   --   --  3.5  --   --   --   --   --    < > = values in this interval not displayed.   Liver Function Tests: Recent Labs    10/03/17  AST 12*  ALT 11  ALKPHOS 80   No results for input(s): LIPASE, AMYLASE in the last 8760 hours. No results for input(s): AMMONIA in the last 8760 hours. CBC: Recent Labs    02/20/17 1853 09/07/17 1720  09/17/17  10/11/17 0506 10/12/17 0605 10/13/17 0546  WBC 5.5 7.5   < > 7.7   < > 6.7 6.6 7.9  NEUTROABS 2.7 6.1  --  5  --   --   --   --   HGB 10.5* 10.8*   < > 8.9*   < > 7.8* 7.5* 7.9*  HCT 31.9* 30.4*   < > 27*   < > 24.8* 23.1* 24.9*  MCV 82.9 76.2*   < >  --    < > 84.4 83.7 83.3  PLT 290 298   < > 373   < > 252 231 159   < > = values in this interval not displayed.   Cardiac Enzymes: Recent Labs    09/07/17 1807  CKTOTAL 437*   BNP: Invalid input(s): POCBNP No results found for: HGBA1C Lab Results  Component Value Date  TSH 1.378 09/07/2017   Lab Results  Component Value Date   VITAMINB12 406 09/17/2017   No results found for: FOLATE No results found for: IRON, TIBC, FERRITIN  Imaging and Procedures obtained prior to SNF admission: Dg Pelvis Portable  Result Date: 10/10/2017 CLINICAL DATA:  Status post right total hip replacement EXAM: PORTABLE PELVIS 1-2 VIEWS COMPARISON:  Pelvis CT with bony reformats September 07, 2017 FINDINGS: Frontal pelvis obtained. There is a total hip replacement on the right with prosthetic components well-seated. There is chronic bone loss inferior to the prosthesis along the lateral ischium on the right. No acute fracture or dislocation There  is advanced avascular necrosis involving the left femoral head with flattening and bony remodeling. There is marked narrowing of the joint space in this area. There are fragmented calcifications within the left hip joint, stable. IMPRESSION: Prosthetic components of right total hip replacement well-seated. Loss of bone inferior to the acetabular portion the prosthesis is unchanged. Advanced avascular necrosis left femoral head with remodeling and foci of calcification within the left hip joint. No acute fracture or dislocation. Electronically Signed   By: Bretta BangWilliam  Woodruff III M.D.   On: 10/10/2017 19:20   Dg C-arm 1-60 Min-no Report  Result Date: 10/10/2017 Fluoroscopy was utilized by the requesting physician.  No radiographic interpretation.   Dg Hip Operative Unilat W Or W/o Pelvis Right  Result Date: 10/10/2017 CLINICAL DATA:  Avascular necrosis of the right hip. Right total hip replacement. EXAM: OPERATIVE RIGHT HIP (WITH PELVIS IF PERFORMED) 1 VIEW TECHNIQUE: Fluoroscopic spot image(s) were submitted for interpretation post-operatively. COMPARISON:  Radiographs dated 09/07/2017 FINDINGS: AP C-arm images demonstrate that the acetabular and femoral components of the right total hip prosthesis appear in excellent position. No appreciable fracture. IMPRESSION: Satisfactory appearance of the right total hip prosthesis in the AP projection. Electronically Signed   By: Francene BoyersJames  Maxwell M.D.   On: 10/10/2017 19:54    Assessment/Plan   ICD-10-CM   1. Postoperative anemia D64.9   2. Status post hip surgery Z98.890    right THA 2/2 AVN on 10/10/17 by Dr Linna CapriceSwinteck  3. 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 Depression with anxiety F41.8   5. Pure hypercholesterolemia E78.00   6. Alcoholism (HCC) F10.20     Cont current meds as ordered  PT/OT as ordered  F/u with ortho as scheduled  Wound care as ordered  GOAL: short term rehab then probable long term care vs ALF. Communicated  with pt and nursing.  Will follow  Labs/tests ordered: cbc    Yehia Mcbain S. Ancil Linseyarter, D. O., F. A. C. O. I.  [facility] [address] [place], [place] [address] [phone] Cell (Monday-Friday 8 AM - 5 PM) [phone] After 5 PM and follow prompts

## 2017-10-17 NOTE — Patient Instructions (Signed)
Norma Ayers , Thank you for taking time to come for your Medicare Wellness Visit. I appreciate your ongoing commitment to your health goals. Please review the following plan we discussed and let me know if I can assist you in the future.   Screening recommendations/referrals: Colonoscopy due, ordered Mammogram due, ordered Bone Density due, ordered Recommended yearly ophthalmology/optometry visit for glaucoma screening and checkup Recommended yearly dental visit for hygiene and checkup  Vaccinations: Influenza vaccine up to date. Due 2019 fall season Pneumococcal vaccine 13 due, ordered Tdap vaccine up to date. Due 09/28/2024 Shingles vaccine not in records    Advanced directives: Need a copy for chart  Conditions/risks identified: none  Next appointment: Dr. Montez Moritaarter makes rounds   Preventive Care 69 Years and Older, Female Preventive care refers to lifestyle choices and visits with your health care provider that can promote health and wellness. What does preventive care include?  A yearly physical exam. This is also called an annual well check.  Dental exams once or twice a year.  Routine eye exams. Ask your health care provider how often you should have your eyes checked.  Personal lifestyle choices, including:  Daily care of your teeth and gums.  Regular physical activity.  Eating a healthy diet.  Avoiding tobacco and drug use.  Limiting alcohol use.  Practicing safe sex.  Taking low-dose aspirin every day.  Taking vitamin and mineral supplements as recommended by your health care provider. What happens during an annual well check? The services and screenings done by your health care provider during your annual well check will depend on your age, overall health, lifestyle risk factors, and family history of disease. Counseling  Your health care provider may ask you questions about your:  Alcohol use.  Tobacco use.  Drug use.  Emotional well-being.  Home  and relationship well-being.  Sexual activity.  Eating habits.  History of falls.  Memory and ability to understand (cognition).  Work and work Astronomerenvironment.  Reproductive health. Screening  You may have the following tests or measurements:  Height, weight, and BMI.  Blood pressure.  Lipid and cholesterol levels. These may be checked every 5 years, or more frequently if you are over 69 years old.  Skin check.  Lung cancer screening. You may have this screening every year starting at age 69 if you have a 30-pack-year history of smoking and currently smoke or have quit within the past 15 years.  Fecal occult blood test (FOBT) of the stool. You may have this test every year starting at age 69.  Flexible sigmoidoscopy or colonoscopy. You may have a sigmoidoscopy every 5 years or a colonoscopy every 10 years starting at age 69.  Hepatitis C blood test.  Hepatitis B blood test.  Sexually transmitted disease (STD) testing.  Diabetes screening. This is done by checking your blood sugar (glucose) after you have not eaten for a while (fasting). You may have this done every 1-3 years.  Bone density scan. This is done to screen for osteoporosis. You may have this done starting at age 69.  Mammogram. This may be done every 1-2 years. Talk to your health care provider about how often you should have regular mammograms. Talk with your health care provider about your test results, treatment options, and if necessary, the need for more tests. Vaccines  Your health care provider may recommend certain vaccines, such as:  Influenza vaccine. This is recommended every year.  Tetanus, diphtheria, and acellular pertussis (Tdap, Td) vaccine. You may  need a Td booster every 10 years.  Zoster vaccine. You may need this after age 69.  Pneumococcal 13-valent conjugate (PCV13) vaccine. One dose is recommended after age 69.  Pneumococcal polysaccharide (PPSV23) vaccine. One dose is recommended  after age 69. Talk to your health care provider about which screenings and vaccines you need and how often you need them. This information is not intended to replace advice given to you by your health care provider. Make sure you discuss any questions you have with your health care provider. Document Released: 10/28/2015 Document Revised: 06/20/2016 Document Reviewed: 08/02/2015 Elsevier Interactive Patient Education  2017 [employer] Prevention in the Home Falls can cause injuries. They can happen to people of all ages. There are many things you can do to make your home safe and to help prevent falls. What can I do on the outside of my home?  Regularly fix the edges of walkways and driveways and fix any cracks.  Remove anything that might make you trip as you walk through a door, such as a raised step or threshold.  Trim any bushes or trees on the path to your home.  Use bright outdoor lighting.  Clear any walking paths of anything that might make someone trip, such as rocks or tools.  Regularly check to see if handrails are loose or broken. Make sure that both sides of any steps have handrails.  Any raised decks and porches should have guardrails on the edges.  Have any leaves, snow, or ice cleared regularly.  Use sand or salt on walking paths during winter.  Clean up any spills in your garage right away. This includes oil or grease spills. What can I do in the bathroom?  Use night lights.  Install grab bars by the toilet and in the tub and shower. Do not use towel bars as grab bars.  Use non-skid mats or decals in the tub or shower.  If you need to sit down in the shower, use a plastic, non-slip stool.  Keep the floor dry. Clean up any water that spills on the floor as soon as it happens.  Remove soap buildup in the tub or shower regularly.  Attach bath mats securely with double-sided non-slip rug tape.  Do not have throw rugs and other things on the floor  that can make you trip. What can I do in the bedroom?  Use night lights.  Make sure that you have a light by your bed that is easy to reach.  Do not use any sheets or blankets that are too big for your bed. They should not hang down onto the floor.  Have a firm chair that has side arms. You can use this for support while you get dressed.  Do not have throw rugs and other things on the floor that can make you trip. What can I do in the kitchen?  Clean up any spills right away.  Avoid walking on wet floors.  Keep items that you use a lot in easy-to-reach places.  If you need to reach something above you, use a strong step stool that has a grab bar.  Keep electrical cords out of the way.  Do not use floor polish or wax that makes floors slippery. If you must use wax, use non-skid floor wax.  Do not have throw rugs and other things on the floor that can make you trip. What can I do with my stairs?  Do not leave any items on  the stairs.  Make sure that there are handrails on both sides of the stairs and use them. Fix handrails that are broken or loose. Make sure that handrails are as long as the stairways.  Check any carpeting to make sure that it is firmly attached to the stairs. Fix any carpet that is loose or worn.  Avoid having throw rugs at the top or bottom of the stairs. If you do have throw rugs, attach them to the floor with carpet tape.  Make sure that you have a light switch at the top of the stairs and the bottom of the stairs. If you do not have them, ask someone to add them for you. What else can I do to help prevent falls?  Wear shoes that:  Do not have high heels.  Have rubber bottoms.  Are comfortable and fit you well.  Are closed at the toe. Do not wear sandals.  If you use a stepladder:  Make sure that it is fully opened. Do not climb a closed stepladder.  Make sure that both sides of the stepladder are locked into place.  Ask someone to hold it  for you, if possible.  Clearly mark and make sure that you can see:  Any grab bars or handrails.  First and last steps.  Where the edge of each step is.  Use tools that help you move around (mobility aids) if they are needed. These include:  Canes.  Walkers.  Scooters.  Crutches.  Turn on the lights when you go into a dark area. Replace any light bulbs as soon as they burn out.  Set up your furniture so you have a clear path. Avoid moving your furniture around.  If any of your floors are uneven, fix them.  If there are any pets around you, be aware of where they are.  Review your medicines with your doctor. Some medicines can make you feel dizzy. This can increase your chance of falling. Ask your doctor what other things that you can do to help prevent falls. This information is not intended to replace advice given to you by your health care provider. Make sure you discuss any questions you have with your health care provider. Document Released: 07/28/2009 Document Revised: 03/08/2016 Document Reviewed: 11/05/2014 Elsevier Interactive Patient Education  2017 [employer].

## 2017-10-18 DIAGNOSIS — M25552 Pain in left hip: Secondary | ICD-10-CM | POA: Diagnosis not present

## 2017-10-18 DIAGNOSIS — R2689 Other abnormalities of gait and mobility: Secondary | ICD-10-CM | POA: Diagnosis not present

## 2017-10-18 DIAGNOSIS — M25551 Pain in right hip: Secondary | ICD-10-CM | POA: Diagnosis not present

## 2017-10-18 DIAGNOSIS — Z9181 History of falling: Secondary | ICD-10-CM | POA: Diagnosis not present

## 2017-10-18 LAB — HM HEPATITIS C SCREENING LAB: HM HEPATITIS C SCREENING: NEGATIVE

## 2017-10-21 ENCOUNTER — Other Ambulatory Visit: Payer: Self-pay

## 2017-10-21 DIAGNOSIS — R2689 Other abnormalities of gait and mobility: Secondary | ICD-10-CM | POA: Diagnosis not present

## 2017-10-21 DIAGNOSIS — Z9181 History of falling: Secondary | ICD-10-CM | POA: Diagnosis not present

## 2017-10-21 DIAGNOSIS — M25551 Pain in right hip: Secondary | ICD-10-CM | POA: Diagnosis not present

## 2017-10-21 DIAGNOSIS — M25552 Pain in left hip: Secondary | ICD-10-CM | POA: Diagnosis not present

## 2017-10-21 MED ORDER — LORAZEPAM 0.5 MG PO TABS: ORAL_TABLET | ORAL | 0 refills | Status: DC

## 2017-10-21 NOTE — Telephone Encounter (Signed)
RX faxed to [facility] @ [phone], phone number [phone] 

## 2017-10-23 ENCOUNTER — Encounter: Payer: Self-pay | Admitting: Adult Health

## 2017-10-23 ENCOUNTER — Non-Acute Institutional Stay (SKILLED_NURSING_FACILITY): Payer: Medicare Other | Admitting: Adult Health

## 2017-10-23 DIAGNOSIS — M25551 Pain in right hip: Secondary | ICD-10-CM | POA: Diagnosis not present

## 2017-10-23 DIAGNOSIS — Z96641 Presence of right artificial hip joint: Secondary | ICD-10-CM | POA: Diagnosis not present

## 2017-10-23 DIAGNOSIS — Z9181 History of falling: Secondary | ICD-10-CM | POA: Diagnosis not present

## 2017-10-23 DIAGNOSIS — F819 Developmental disorder of scholastic skills, unspecified: Secondary | ICD-10-CM | POA: Diagnosis not present

## 2017-10-23 DIAGNOSIS — F0151 Vascular dementia with behavioral disturbance: Secondary | ICD-10-CM

## 2017-10-23 DIAGNOSIS — M25552 Pain in left hip: Secondary | ICD-10-CM | POA: Diagnosis not present

## 2017-10-23 DIAGNOSIS — Z471 Aftercare following joint replacement surgery: Secondary | ICD-10-CM | POA: Diagnosis not present

## 2017-10-23 DIAGNOSIS — R2689 Other abnormalities of gait and mobility: Secondary | ICD-10-CM | POA: Diagnosis not present

## 2017-10-23 DIAGNOSIS — M87051 Idiopathic aseptic necrosis of right femur: Secondary | ICD-10-CM

## 2017-10-23 DIAGNOSIS — F01518 Vascular dementia, unspecified severity, with other behavioral disturbance: Secondary | ICD-10-CM

## 2017-10-23 NOTE — Progress Notes (Signed)
Location:   [facility] Room Number: 113 B Place of Service:  SNF (31)   CODE STATUS: Full Code  No Known Allergies  Chief Complaint  Patient presents with  . Acute Visit    Care Plan Meeting    HPI:  We have come together for her care plan meeting. Her family is present. She has recently had a right hip replacement. She is due for a left up replacement on 11-21-17. We have spent an extended period of time discussing her MOST Form. At this time her sister will need to discuss this with other family members before making any decisions. She is unable to participate in the hpi or ros. There are no reports of uncontrolled pain; no reports in changes in appetite; no behavioral issues. There are no nursing concerns at this time.    Past Medical History:  Diagnosis Date  . Alcoholism (HCC) 09/08/2017  . Avascular necrosis of bones of both hips (HCC) 09/08/2017  . Cognitive developmental delay 09/08/2017  . Hyperlipidemia   . Hypertension   . Right orbit fracture (HCC) 09/08/2017  . Tongue lesion 09/08/2017    Past Surgical History:  Procedure Laterality Date  . gum tumor     removed  . TOTAL HIP ARTHROPLASTY Right 10/10/2017   Procedure: RIGHT TOTAL HIP ARTHROPLASTY ANTERIOR APPROACH;  Surgeon: Samson Frederic, MD;  Location: [facility];  Service: Orthopedics;  Laterality: Right;  Needs RNFA    Social History   Socioeconomic History  . Marital status: Widowed    Spouse name: Not on file  . Number of children: Not on file  . Years of education: Not on file  . Highest education level: Not on file  Social Needs  . Financial resource strain: Not on file  . Food insecurity - worry: Not on file  . Food insecurity - inability: Not on file  . Transportation needs - medical: Not on file  . Transportation needs - non-medical: Not on file  Occupational History  . Not on file  Tobacco Use  . Smoking status: Never Smoker  . Smokeless tobacco: Never Used  Substance and  Sexual Activity  . Alcohol use: Yes    Comment: 2 times per week  . Drug use: No  . Sexual activity: Not on file  Other Topics Concern  . Not on file  Social History Narrative  . Not on file   Family History  Problem Relation Age of Onset  . Emphysema Mother   . Hypertension Sister   . Heart attack Brother       VITAL SIGNS BP 130/70   Pulse 88   Temp 98.1 F (36.7 C)   Resp 18   Ht 5\' 1"  (1.549 m)   Wt 112 lb 4.8 oz (50.9 kg)   SpO2 98%   BMI 21.22 kg/m   Outpatient Encounter Medications as of 10/23/2017  Medication Sig  . apixaban (ELIQUIS) 2.5 MG TABS tablet Take 1 tablet (2.5 mg total) by mouth every 12 (twelve) hours.  . docusate sodium (COLACE) 100 MG capsule Take 1 capsule (100 mg total) by mouth 2 (two) times daily.  . folic acid (FOLVITE) 1 MG tablet Take 1 tablet (1 mg total) by mouth daily.  Marland Kitchen HYDROcodone-acetaminophen (NORCO/VICODIN) 5-325 MG tablet Take 1 tablet by mouth every 6 (six) hours.  . lidocaine (LIDODERM) 5 % Apply 1 patch to Right Hip topically one time a day. Remove & Discard patch within 12 hours or as directed by MD  .  LORazepam (ATIVAN) 0.5 MG tablet Give 1 tablet by mouth every morning  . lovastatin (MEVACOR) 40 MG tablet Take 40 mg by mouth daily.  . naproxen sodium (ALEVE) 220 MG tablet Take 440 mg by mouth 2 (two) times daily.   . Nutritional Supplements (NUTRITIONAL SUPPLEMENT PO) House Supplement -Give by mouth two times daily for Health Supplement  . ondansetron (ZOFRAN) 4 MG tablet Take 1 tablet (4 mg total) by mouth every 6 (six) hours as needed for nausea.  Marland Kitchen. senna (SENOKOT) 8.6 MG TABS tablet Take 2 tablets (17.2 mg total) by mouth at bedtime.  . SERTRALINE HCL PO Take 75 mg by mouth daily.  Marland Kitchen. thiamine 100 MG tablet Take 1 tablet (100 mg total) by mouth daily.  . [DISCONTINUED] Hydrocodone-Acetaminophen 5-300 MG TABS Take 1 tablet by mouth every 6 (six) hours as needed. Give 1 tablet with meals  . [DISCONTINUED] sertraline (ZOLOFT)  25 MG tablet Take 75 mg by mouth daily. Beginning on 10/14/17  . [DISCONTINUED] sertraline (ZOLOFT) 50 MG tablet Take 50 mg by mouth daily. X 5 days, ending on 10/14/17   No facility-administered encounter medications on file as of 10/23/2017.      SIGNIFICANT DIAGNOSTIC EXAMS  PREVIOUS:   09-07-17: chest x-ray: No active disease.   09-07-17: ct of head cervical spine; maxillofacial:  1. Acute right orbital floor and medial orbital wall fractures with fracture involving the canal for the right maxillary nerve seen along the medial orbital floor. There is 3 mm of caudal displacement of the orbital floor fracture with herniation of periorbital fat and minimal herniation of the right maxillary nerve. 2. Associated with the right orbital fractures are a small amount of medial extraconal hemorrhage/fluid as well as small amount of lateral intraconal hemorrhage. Hyperdense fluid in the right maxillary sinus would be in keeping with hemorrhage/blood products. Right periorbital soft tissue swelling is noted. 3. No acute intracranial abnormality. 4. Mild cervical spondylosis without acute cervical spine fracture.  09-07-17: right hip x-ray:  1. No acute abnormality of the bony pelvis or the right hip. 2. 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  09-07-17: ct of pelvis:  1. No acute fracture of the pelvis or hips. 2. Bilateral hip AVN and advanced osteoarthritis, findings have progressed in the right hip from CT 6 months prior.  10-10-17: pelvic x-ray: Prosthetic components of right total hip replacement well-seated. Loss of bone inferior to the acetabular portion the prosthesis is unchanged. Advanced avascular necrosis left femoral head with remodeling and foci of calcification within the left hip joint. No acute  fracture or dislocation.   NO NEW EXAMS     LABS REVIEWED: PREVOUS:   09-07-17: wbc 7.5; hgb 10.8; hct 30.4; mcv 76.2; plt 298; glucose 105; bun 21; creat 1.18; k+ 3.2; na++ 116; ca 8.9 ck 437; tsh 1.378 09-09-17: glucose 98; bun 9; creat 0.82; k+ 4.1; na++ 115; ca 8.0; mag 1.5; phos 1.9; uric 4.2 09-10-17: wbc 7.7; hgb 9.6; hct 26.6; mcv 75.6 plt 260; glucose 121; bun <5; creat 0.69; k+ 3.4; na++ 112; ca 8.0 09-11-17: wbc 7.1; hgb 9.1; hct 25.8; mcv 75.9; plt 277; glucose 88; bun <5; creat 0.67; k+ 3.5; na++ 118; ca 8.671mag 1.2; phos 3.5  09-11-17: (repeat) glucose 120; bun 6; creat 0.83; k+ 3.8; na++ 130; ca 8.1 09-12-17: wbc 6.9; hgb 8.9; hct 25.2; mcv 78.5;  plt 275; glucose 104; bun 6; creat 0.87; k+ 4.1; na++ 130; ca 8.6 10-03-17: glucose 94; bun 9.1; creat 0.77; k+ 4.5; na++ 141; ca 9.4; liver normal albumin 3.6; mag 1.6 10-10-17: wbc 7.0; hgb 10.9; hct 34.7; mcv 84.0; plt 360 10-11-17: glucose 116; bun 12; creat 0.81 ;k+ 4.0; na++ 138; ca 8.2 10-13-17: wbc 7.9; hgb 7.9; hct 24.9; mcv 83.3; plt 159   NO NEW LABS      Review of Systems  Unable to perform ROS: Dementia (confused )    Physical Exam  Constitutional: No distress.  Frail   Neck: No thyromegaly present.  Cardiovascular: Normal rate, regular rhythm and intact distal pulses.  Murmur heard. 1/6  Pulmonary/Chest: Effort normal and breath sounds normal. No respiratory distress.  Abdominal: Soft. Bowel sounds are normal. She exhibits no distension. There is no tenderness.  Musculoskeletal: She exhibits no edema.  Is able to move all extremities Status post right hip replacement   Lymphadenopathy:    She has no cervical adenopathy.  Neurological: She is alert.  Skin: Skin is warm and dry. She is not diaphoretic.  Psychiatric: She has a normal mood and affect.    ASSESSMENT/ PLAN:  TODAY:   1. 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  She did verbalize understanding of information given.    MD is aware of resident's narcotic use and is in agreement with current plan of care. We will attempt to wean resident as apropriate     Synthia Innocent NP [facility]  Contact [phone] Monday through Friday 8am- 5pm  After hours call [phone]

## 2017-10-24 ENCOUNTER — Other Ambulatory Visit: Payer: Self-pay

## 2017-10-24 DIAGNOSIS — R2689 Other abnormalities of gait and mobility: Secondary | ICD-10-CM | POA: Diagnosis not present

## 2017-10-24 DIAGNOSIS — Z9181 History of falling: Secondary | ICD-10-CM | POA: Diagnosis not present

## 2017-10-24 DIAGNOSIS — M25551 Pain in right hip: Secondary | ICD-10-CM | POA: Diagnosis not present

## 2017-10-24 DIAGNOSIS — M25552 Pain in left hip: Secondary | ICD-10-CM | POA: Diagnosis not present

## 2017-10-24 MED ORDER — HYDROCODONE-ACETAMINOPHEN 5-325 MG PO TABS: ORAL_TABLET | ORAL | 0 refills | Status: DC

## 2017-10-24 NOTE — Telephone Encounter (Signed)
RX faxed to [facility] @ [phone], phone number [phone] 

## 2017-10-29 ENCOUNTER — Ambulatory Visit: Payer: Self-pay | Admitting: Orthopedic Surgery

## 2017-10-29 ENCOUNTER — Ambulatory Visit: Payer: Medicare Other | Admitting: Cardiovascular Disease

## 2017-10-29 DIAGNOSIS — M25552 Pain in left hip: Secondary | ICD-10-CM | POA: Diagnosis not present

## 2017-10-29 DIAGNOSIS — Z9181 History of falling: Secondary | ICD-10-CM | POA: Diagnosis not present

## 2017-10-29 DIAGNOSIS — R2689 Other abnormalities of gait and mobility: Secondary | ICD-10-CM | POA: Diagnosis not present

## 2017-10-29 DIAGNOSIS — M25551 Pain in right hip: Secondary | ICD-10-CM | POA: Diagnosis not present

## 2017-11-05 DIAGNOSIS — Z9181 History of falling: Secondary | ICD-10-CM | POA: Diagnosis not present

## 2017-11-05 DIAGNOSIS — M25551 Pain in right hip: Secondary | ICD-10-CM | POA: Diagnosis not present

## 2017-11-05 DIAGNOSIS — R2689 Other abnormalities of gait and mobility: Secondary | ICD-10-CM | POA: Diagnosis not present

## 2017-11-05 DIAGNOSIS — M25552 Pain in left hip: Secondary | ICD-10-CM | POA: Diagnosis not present

## 2017-11-12 ENCOUNTER — Ambulatory Visit: Payer: Self-pay | Admitting: Orthopedic Surgery

## 2017-11-12 DIAGNOSIS — Z96641 Presence of right artificial hip joint: Secondary | ICD-10-CM | POA: Diagnosis not present

## 2017-11-12 DIAGNOSIS — R2689 Other abnormalities of gait and mobility: Secondary | ICD-10-CM | POA: Diagnosis not present

## 2017-11-12 DIAGNOSIS — M25551 Pain in right hip: Secondary | ICD-10-CM | POA: Diagnosis not present

## 2017-11-12 DIAGNOSIS — Z471 Aftercare following joint replacement surgery: Secondary | ICD-10-CM | POA: Diagnosis not present

## 2017-11-12 DIAGNOSIS — M25552 Pain in left hip: Secondary | ICD-10-CM | POA: Diagnosis not present

## 2017-11-12 DIAGNOSIS — Z9181 History of falling: Secondary | ICD-10-CM | POA: Diagnosis not present

## 2017-11-12 NOTE — H&P (Signed)
TOTAL HIP ADMISSION H&P  Patient is admitted for left total hip arthroplasty.  Subjective:  Chief Complaint: left hip pain  HPI: Norma Ayers, 67 y.o. female, has a history of pain and functional disability in the left hip(s) due to AVN and patient has failed non-surgical conservative treatments for greater than 12 weeks to include NSAID's and/or analgesics, supervised PT with diminished ADL's post treatment, use of assistive devices and activity modification.  Onset of symptoms was gradual starting 3 years ago with rapidlly worsening course since that time.The patient noted no past surgery on the left hip(s).  Patient currently rates pain in the left hip at 10 out of 10 with activity. Patient has night pain, worsening of pain with activity and weight bearing, trendelenberg gait, pain that interfers with activities of daily living, pain with passive range of motion and crepitus. Patient has evidence of subchondral cysts, subchondral sclerosis, periarticular osteophytes, joint subluxation, joint space narrowing and severe bone loss by imaging studies. This condition presents safety issues increasing the risk of falls. There is no current active infection.  Patient Active Problem List   Diagnosis Date Noted  . Vascular dementia with behavior disturbance 10/16/2017  . Status post right hip replacement 10/16/2017  . Hypomagnesemia 10/15/2017  . Agitation 10/15/2017  . Depression with anxiety 10/11/2017  . Avascular necrosis of hip, right (HCC) 10/10/2017  . Right orbit fracture (HCC) 09/08/2017  . Alcoholism (HCC) 09/08/2017  . Tongue lesion 09/08/2017  . Cognitive developmental delay 09/08/2017  . Avascular necrosis of bones of both hips (HCC) 09/08/2017  . Hyponatremia 09/07/2017  . Hyperlipidemia    Past Medical History:  Diagnosis Date  . Alcoholism (HCC) 09/08/2017  . Avascular necrosis of bones of both hips (HCC) 09/08/2017  . Cognitive developmental delay 09/08/2017  .  Hyperlipidemia   . Hypertension   . Right orbit fracture (HCC) 09/08/2017  . Tongue lesion 09/08/2017    Past Surgical History:  Procedure Laterality Date  . gum tumor     removed  . TOTAL HIP ARTHROPLASTY Right 10/10/2017   Procedure: RIGHT TOTAL HIP ARTHROPLASTY ANTERIOR APPROACH;  Surgeon: Samson Frederic, MD;  Location: [facility];  Service: Orthopedics;  Laterality: Right;  Needs RNFA    Current Outpatient Medications  Medication Sig Dispense Refill Last Dose  . apixaban (ELIQUIS) 2.5 MG TABS tablet Take 1 tablet (2.5 mg total) by mouth every 12 (twelve) hours. 60 tablet 0 Taking  . docusate sodium (COLACE) 100 MG capsule Take 1 capsule (100 mg total) by mouth 2 (two) times daily. 10 capsule 0 Taking  . folic acid (FOLVITE) 1 MG tablet Take 1 tablet (1 mg total) by mouth daily.   Taking  . HYDROcodone-acetaminophen (NORCO/VICODIN) 5-325 MG tablet Give 1 tablet by mouth five times daily (Patient taking differently: Take 1 tablet by mouth 5 (five) times daily. ) 200 tablet 0   . LORazepam (ATIVAN) 0.5 MG tablet Give 1 tablet by mouth every morning (Patient taking differently: Take 0.5 mg by mouth daily. ) 30 tablet 0 Taking  . lovastatin (MEVACOR) 40 MG tablet Take 40 mg by mouth daily.   Taking  . naproxen sodium (ALEVE) 220 MG tablet Take 440 mg by mouth 2 (two) times daily.    Taking  . ondansetron (ZOFRAN) 4 MG tablet Take 1 tablet (4 mg total) by mouth every 6 (six) hours as needed for nausea. 20 tablet 0 Taking  . senna (SENOKOT) 8.6 MG TABS tablet Take 2 tablets (17.2 mg total) by  mouth at bedtime. 120 each 0 Taking  . sertraline (ZOLOFT) 50 MG tablet Take 75 mg by mouth daily.   Taking  . thiamine 100 MG tablet Take 1 tablet (100 mg total) by mouth daily.   Taking   No current facility-administered medications for this visit.    No Known Allergies  Social History   Tobacco Use  . Smoking status: Never Smoker  . Smokeless tobacco: Never Used  Substance Use Topics  . Alcohol  use: Yes    Comment: 2 times per week    Family History  Problem Relation Age of Onset  . Emphysema Mother   . Hypertension Sister   . Heart attack Brother      Review of Systems  Constitutional: Negative.   HENT: Negative.   Eyes: Negative.   Respiratory: Negative.   Cardiovascular: Negative.   Gastrointestinal: Negative.   Genitourinary: Negative.   Musculoskeletal: Positive for joint pain.  Skin: Negative.   Neurological: Negative.   Endo/Heme/Allergies: Negative.   Psychiatric/Behavioral: The patient is nervous/anxious.     Objective:  Physical Exam  Vitals reviewed. Constitutional: She is oriented to person, place, and time. She appears well-developed and well-nourished.  HENT:  Head: Normocephalic and atraumatic.  Eyes: Conjunctivae and EOM are normal. Pupils are equal, round, and reactive to light.  Neck: Normal range of motion. Neck supple.  Cardiovascular: Normal rate, regular rhythm and intact distal pulses.  Respiratory: Effort normal. No respiratory distress.  GI: Soft. She exhibits no distension.  Genitourinary:  Genitourinary Comments: deferred  Musculoskeletal:       Left hip: She exhibits decreased range of motion, decreased strength, bony tenderness and crepitus.       Legs: Neurological: She is alert and oriented to person, place, and time. She has normal reflexes.  Skin: Skin is warm and dry.  Psychiatric: She has a normal mood and affect. Her behavior is normal. Judgment and thought content normal.    Vital signs in last 24 hours: @VSRANGES @  Labs:   Estimated body mass index is 21.22 kg/m as calculated from the following:   Height as of 10/23/17: 5\' 1"  (1.549 m).   Weight as of 10/23/17: 50.9 kg (112 lb 4.8 oz).   Imaging Review Plain radiographs demonstrate severe degenerative joint disease of the left hip(s). There is severe bone loss and subluxation of the hip.  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 The treatment options including medical management, injection therapy, arthroscopy and arthroplasty were discussed at length. The risks and benefits of total hip arthroplasty were presented and reviewed. The risks due to aseptic loosening, infection, stiffness, dislocation/subluxation,  thromboembolic complications and other imponderables were discussed.  The patient acknowledged the explanation, agreed to proceed with the plan and consent was signed. Patient is being admitted for inpatient treatment for surgery, pain control, PT, OT, prophylactic antibiotics, VTE prophylaxis, progressive ambulation and ADL's and discharge planning.The patient is planning to be discharged to skilled nursing facility. (+) apixaban.

## 2017-11-12 NOTE — H&P (View-Only) (Signed)
TOTAL HIP ADMISSION H&P  Patient is admitted for left total hip arthroplasty.  Subjective:  Chief Complaint: left hip pain  HPI: Norma Ayers, 67 y.o. female, has a history of pain and functional disability in the left hip(s) due to AVN and patient has failed non-surgical conservative treatments for greater than 12 weeks to include NSAID's and/or analgesics, supervised PT with diminished ADL's post treatment, use of assistive devices and activity modification.  Onset of symptoms was gradual starting 3 years ago with rapidlly worsening course since that time.The patient noted no past surgery on the left hip(s).  Patient currently rates pain in the left hip at 10 out of 10 with activity. Patient has night pain, worsening of pain with activity and weight bearing, trendelenberg gait, pain that interfers with activities of daily living, pain with passive range of motion and crepitus. Patient has evidence of subchondral cysts, subchondral sclerosis, periarticular osteophytes, joint subluxation, joint space narrowing and severe bone loss by imaging studies. This condition presents safety issues increasing the risk of falls. There is no current active infection.  Patient Active Problem List   Diagnosis Date Noted  . Vascular dementia with behavior disturbance 10/16/2017  . Status post right hip replacement 10/16/2017  . Hypomagnesemia 10/15/2017  . Agitation 10/15/2017  . Depression with anxiety 10/11/2017  . Avascular necrosis of hip, right (HCC) 10/10/2017  . Right orbit fracture (HCC) 09/08/2017  . Alcoholism (HCC) 09/08/2017  . Tongue lesion 09/08/2017  . Cognitive developmental delay 09/08/2017  . Avascular necrosis of bones of both hips (HCC) 09/08/2017  . Hyponatremia 09/07/2017  . Hyperlipidemia    Past Medical History:  Diagnosis Date  . Alcoholism (HCC) 09/08/2017  . Avascular necrosis of bones of both hips (HCC) 09/08/2017  . Cognitive developmental delay 09/08/2017  .  Hyperlipidemia   . Hypertension   . Right orbit fracture (HCC) 09/08/2017  . Tongue lesion 09/08/2017    Past Surgical History:  Procedure Laterality Date  . gum tumor     removed  . TOTAL HIP ARTHROPLASTY Right 10/10/2017   Procedure: RIGHT TOTAL HIP ARTHROPLASTY ANTERIOR APPROACH;  Surgeon: Samson Frederic, MD;  Location: [facility];  Service: Orthopedics;  Laterality: Right;  Needs RNFA    Current Outpatient Medications  Medication Sig Dispense Refill Last Dose  . apixaban (ELIQUIS) 2.5 MG TABS tablet Take 1 tablet (2.5 mg total) by mouth every 12 (twelve) hours. 60 tablet 0 Taking  . docusate sodium (COLACE) 100 MG capsule Take 1 capsule (100 mg total) by mouth 2 (two) times daily. 10 capsule 0 Taking  . folic acid (FOLVITE) 1 MG tablet Take 1 tablet (1 mg total) by mouth daily.   Taking  . HYDROcodone-acetaminophen (NORCO/VICODIN) 5-325 MG tablet Give 1 tablet by mouth five times daily (Patient taking differently: Take 1 tablet by mouth 5 (five) times daily. ) 200 tablet 0   . LORazepam (ATIVAN) 0.5 MG tablet Give 1 tablet by mouth every morning (Patient taking differently: Take 0.5 mg by mouth daily. ) 30 tablet 0 Taking  . lovastatin (MEVACOR) 40 MG tablet Take 40 mg by mouth daily.   Taking  . naproxen sodium (ALEVE) 220 MG tablet Take 440 mg by mouth 2 (two) times daily.    Taking  . ondansetron (ZOFRAN) 4 MG tablet Take 1 tablet (4 mg total) by mouth every 6 (six) hours as needed for nausea. 20 tablet 0 Taking  . senna (SENOKOT) 8.6 MG TABS tablet Take 2 tablets (17.2 mg total) by  mouth at bedtime. 120 each 0 Taking  . sertraline (ZOLOFT) 50 MG tablet Take 75 mg by mouth daily.   Taking  . thiamine 100 MG tablet Take 1 tablet (100 mg total) by mouth daily.   Taking   No current facility-administered medications for this visit.    No Known Allergies  Social History   Tobacco Use  . Smoking status: Never Smoker  . Smokeless tobacco: Never Used  Substance Use Topics  . Alcohol  use: Yes    Comment: 2 times per week    Family History  Problem Relation Age of Onset  . Emphysema Mother   . Hypertension Sister   . Heart attack Brother      Review of Systems  Constitutional: Negative.   HENT: Negative.   Eyes: Negative.   Respiratory: Negative.   Cardiovascular: Negative.   Gastrointestinal: Negative.   Genitourinary: Negative.   Musculoskeletal: Positive for joint pain.  Skin: Negative.   Neurological: Negative.   Endo/Heme/Allergies: Negative.   Psychiatric/Behavioral: The patient is nervous/anxious.     Objective:  Physical Exam  Vitals reviewed. Constitutional: She is oriented to person, place, and time. She appears well-developed and well-nourished.  HENT:  Head: Normocephalic and atraumatic.  Eyes: Conjunctivae and EOM are normal. Pupils are equal, round, and reactive to light.  Neck: Normal range of motion. Neck supple.  Cardiovascular: Normal rate, regular rhythm and intact distal pulses.  Respiratory: Effort normal. No respiratory distress.  GI: Soft. She exhibits no distension.  Genitourinary:  Genitourinary Comments: deferred  Musculoskeletal:       Left hip: She exhibits decreased range of motion, decreased strength, bony tenderness and crepitus.       Legs: Neurological: She is alert and oriented to person, place, and time. She has normal reflexes.  Skin: Skin is warm and dry.  Psychiatric: She has a normal mood and affect. Her behavior is normal. Judgment and thought content normal.    Vital signs in last 24 hours: @VSRANGES @  Labs:   Estimated body mass index is 21.22 kg/m as calculated from the following:   Height as of 10/23/17: 5\' 1"  (1.549 m).   Weight as of 10/23/17: 50.9 kg (112 lb 4.8 oz).   Imaging Review Plain radiographs demonstrate severe degenerative joint disease of the left hip(s). There is severe bone loss and subluxation of the hip.  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 The treatment options including medical management, injection therapy, arthroscopy and arthroplasty were discussed at length. The risks and benefits of total hip arthroplasty were presented and reviewed. The risks due to aseptic loosening, infection, stiffness, dislocation/subluxation,  thromboembolic complications and other imponderables were discussed.  The patient acknowledged the explanation, agreed to proceed with the plan and consent was signed. Patient is being admitted for inpatient treatment for surgery, pain control, PT, OT, prophylactic antibiotics, VTE prophylaxis, progressive ambulation and ADL's and discharge planning.The patient is planning to be discharged to skilled nursing facility. (+) apixaban.

## 2017-11-13 NOTE — Progress Notes (Signed)
Spoke with Lurena [facility]inerebecca at [facility]. Requested CXR,EKG, in last year. Recent MAR with times takes meds, labs within 30 days.[facility] will provide transportation or call [facility]. Pt. Has a POA sister Kandis Nabeggy Kline cell #[phone] she signs consents for Ms. [facility]. Per [facility] pt. Takes meds whole with water. Fax number for [facility] is [phone]. Contact person today is Chief Operating Officerebecca or Lashana nurse.

## 2017-11-13 NOTE — Progress Notes (Signed)
Spoke with POA sister Kandis Nabeggy kline and she is aware of pt. [facility] and to arrive at 0730 to admitting to sign consent for Norma Ayers. POA  verbalized understanding.

## 2017-11-15 DIAGNOSIS — M25551 Pain in right hip: Secondary | ICD-10-CM | POA: Diagnosis not present

## 2017-11-15 DIAGNOSIS — R2689 Other abnormalities of gait and mobility: Secondary | ICD-10-CM | POA: Diagnosis not present

## 2017-11-15 DIAGNOSIS — Z9181 History of falling: Secondary | ICD-10-CM | POA: Diagnosis not present

## 2017-11-15 DIAGNOSIS — M25552 Pain in left hip: Secondary | ICD-10-CM | POA: Diagnosis not present

## 2017-11-15 NOTE — Progress Notes (Signed)
No records received from [facility], phone number out of order for [facility] at this time

## 2017-11-18 ENCOUNTER — Encounter (HOSPITAL_COMMUNITY): Payer: Self-pay | Admitting: *Deleted

## 2017-11-18 ENCOUNTER — Other Ambulatory Visit: Payer: Self-pay

## 2017-11-18 ENCOUNTER — Other Ambulatory Visit (HOSPITAL_COMMUNITY): Payer: Self-pay | Admitting: *Deleted

## 2017-11-18 NOTE — Progress Notes (Addendum)
Preop instructions for: Norma GianottiBETTY Innes  Date of Birth 10/16/1950                            Date of Procedure:  11-21-2017      Doctor: Linna CapriceSWINTECK [facility] 700 AM Report to: Admitting  Procedure: LEFT TOTAL HIP REPLACEMENT ANTERIOR APPROACH Any procedure time changes, MD office will notify you!   Do not eat or drink past midnight the night before your procedure.(To include any tube feedings-must be discontinued.  Take these morning medications only with sips of water.(or give through gastrostomy or feeding tube). GIVE ATIVAN, HYDROCODONE IF NEEDED, SERTRALINE     Facility contact:  [address] Phone: [phone] FAX [phone] Health Care POA: Gailen ShelterEGGY KLINE SISTER CELL [phone] SIGNS PATIENT CONSENTS  Transportation contact phone[facility] [phone] OR WILL USE PTAR  Please send day of procedure:current med list and meds last taken that day, confirm nothing by mouth status from what time, Patient Demographic info( to include DNR status, problem list, allergies)    Bring Insurance card and picture ID Leave all jewelry and other valuables at place where living( no metal or rings to be worn) No contact lens Women-no make-up, no lotions,perfumes,powders Men-no colognes,lotions  Any questions day of procedure,call Endoscopy [phone]!   Sent from :[facility]                   Phone:[phone]                  Fax:[phone]  Sent by :Cain SieveSHARON Berlin Mokry RN

## 2017-11-18 NOTE — Progress Notes (Signed)
Spoke with [facility] at [facility] and requested most recent mar, last ekg and chest xray, labs with in 30 days and last h and p note

## 2017-11-19 DIAGNOSIS — Z9181 History of falling: Secondary | ICD-10-CM | POA: Diagnosis not present

## 2017-11-19 DIAGNOSIS — M25552 Pain in left hip: Secondary | ICD-10-CM | POA: Diagnosis not present

## 2017-11-19 DIAGNOSIS — M25551 Pain in right hip: Secondary | ICD-10-CM | POA: Diagnosis not present

## 2017-11-19 DIAGNOSIS — R2689 Other abnormalities of gait and mobility: Secondary | ICD-10-CM | POA: Diagnosis not present

## 2017-11-19 NOTE — Progress Notes (Signed)
Faxed instructions to [facility] [place] [facility] and she called back to inform me she had received instructions for patient's surgery on 11/21/2017 and verbalized understanding of those instructions.

## 2017-11-20 ENCOUNTER — Non-Acute Institutional Stay (SKILLED_NURSING_FACILITY): Payer: Medicare Other | Admitting: Adult Health

## 2017-11-20 ENCOUNTER — Encounter: Payer: Self-pay | Admitting: Adult Health

## 2017-11-20 DIAGNOSIS — F0151 Vascular dementia with behavioral disturbance: Secondary | ICD-10-CM | POA: Diagnosis not present

## 2017-11-20 DIAGNOSIS — F1020 Alcohol dependence, uncomplicated: Secondary | ICD-10-CM | POA: Diagnosis not present

## 2017-11-20 DIAGNOSIS — F819 Developmental disorder of scholastic skills, unspecified: Secondary | ICD-10-CM

## 2017-11-20 DIAGNOSIS — M87052 Idiopathic aseptic necrosis of left femur: Secondary | ICD-10-CM | POA: Diagnosis not present

## 2017-11-20 DIAGNOSIS — M87051 Idiopathic aseptic necrosis of right femur: Secondary | ICD-10-CM

## 2017-11-20 DIAGNOSIS — E7800 Pure hypercholesterolemia, unspecified: Secondary | ICD-10-CM | POA: Diagnosis not present

## 2017-11-20 DIAGNOSIS — F01518 Vascular dementia, unspecified severity, with other behavioral disturbance: Secondary | ICD-10-CM

## 2017-11-20 MED ORDER — TRANEXAMIC ACID 1000 MG/10ML IV SOLN
1000.0000 mg | INTRAVENOUS | Status: AC
  Administered 2017-11-21: 1000 mg via INTRAVENOUS
  Filled 2017-11-20: qty 1100

## 2017-11-20 NOTE — Progress Notes (Signed)
[facility]   Place of Service:   SNF   CODE STATUS: Full Code  No Known Allergies  Chief Complaint  Patient presents with  . Medical Management of Chronic Issues    Avascular necrosis of hips; cognitive delay; dementia; alcoholism; dyslipidemia     HPI:  She is a 67 year old long term resident of this facility being seen for the management of her chronic illnesses: avascular necrosis; cognitive delay; dementia; alcoholism hypercholesterolemia. She is unable to participate in the hpi or ros. There are no reports of uncontrolled pain; changes in appetite; no behavorial issues. There are no nursing concerns at this time.   Past Medical History:  Diagnosis Date  . Alcoholism (HCC) 09/08/2017  . Anemia   . Avascular necrosis of bones of both hips (HCC) 09/08/2017  . Cognitive developmental delay 09/08/2017  . Hyperlipidemia   . Hypertension   . Right orbit fracture (HCC) 09/08/2017  . Tongue lesion 09/08/2017    Past Surgical History:  Procedure Laterality Date  . gum tumor     removed  . TOTAL HIP ARTHROPLASTY Right 10/10/2017   Procedure: RIGHT TOTAL HIP ARTHROPLASTY ANTERIOR APPROACH;  Surgeon: Samson FredericSwinteck, Brian, MD;  Location: [facility];  Service: Orthopedics;  Laterality: Right;  Needs RNFA    Social History   Socioeconomic History  . Marital status: Widowed    Spouse name: Not on file  . Number of children: Not on file  . Years of education: Not on file  . Highest education level: Not on file  Social Needs  . Financial resource strain: Not on file  . Food insecurity - worry: Not on file  . Food insecurity - inability: Not on file  . Transportation needs - medical: Not on file  . Transportation needs - non-medical: Not on file  Occupational History  . Not on file  Tobacco Use  . Smoking status: Never Smoker  . Smokeless tobacco: Never Used  Substance and Sexual Activity  . Alcohol use: Yes    Comment: 2 times per week  . Drug use: No  . Sexual  activity: Not on file  Other Topics Concern  . Not on file  Social History Narrative  . Not on file   Family History  Problem Relation Age of Onset  . Emphysema Mother   . Hypertension Sister   . Heart attack Brother       VITAL SIGNS BP 138/70   Pulse 76   Temp 98.7 F (37.1 C) (Oral)   Resp 16   Ht 5\' 1"  (1.549 m)   SpO2 97%   BMI 21.22 kg/m   Outpatient Encounter Medications as of 11/20/2017  Medication Sig  . docusate sodium (COLACE) 100 MG capsule Take 1 capsule (100 mg total) by mouth 2 (two) times daily.  . folic acid (FOLVITE) 1 MG tablet Take 1 tablet (1 mg total) by mouth daily.  Marland Kitchen. HYDROcodone-acetaminophen (NORCO/VICODIN) 5-325 MG tablet Take 1 tablet by mouth 2 (two) times daily.  Marland Kitchen. HYDROcodone-acetaminophen (NORCO/VICODIN) 5-325 MG tablet Take 1 tablet by mouth daily as needed for moderate pain.  Marland Kitchen. LORazepam (ATIVAN) 0.5 MG tablet Give 1 tablet by mouth every morning  . lovastatin (MEVACOR) 40 MG tablet Take 40 mg by mouth daily.  . naproxen sodium (ALEVE) 220 MG tablet Take 440 mg by mouth 2 (two) times daily.   . NON FORMULARY 2 (two) times daily. House Supplement  . ondansetron (ZOFRAN) 4 MG tablet Take 1 tablet (4  mg total) by mouth every 6 (six) hours as needed for nausea.  Marland Kitchen senna (SENOKOT) 8.6 MG TABS tablet Take 2 tablets (17.2 mg total) by mouth at bedtime.  . sertraline (ZOLOFT) 50 MG tablet Take 75 mg by mouth daily.  Marland Kitchen thiamine 100 MG tablet Take 1 tablet (100 mg total) by mouth daily.  . [DISCONTINUED] apixaban (ELIQUIS) 2.5 MG TABS tablet Take 1 tablet (2.5 mg total) by mouth every 12 (twelve) hours.  . [DISCONTINUED] HYDROcodone-acetaminophen (NORCO/VICODIN) 5-325 MG tablet Give 1 tablet by mouth five times daily (Patient taking differently: Take 1 tablet by mouth 5 (five) times daily. )   No facility-administered encounter medications on file as of 11/20/2017.      SIGNIFICANT DIAGNOSTIC EXAMS  PREVIOUS:   09-07-17: chest x-ray: No active  disease.   09-07-17: ct of head cervical spine; maxillofacial:  1. Acute right orbital floor and medial orbital wall fractures with fracture involving the canal for the right maxillary nerve seen along the medial orbital floor. There is 3 mm of caudal displacement of the orbital floor fracture with herniation of periorbital fat and minimal herniation of the right maxillary nerve. 2. Associated with the right orbital fractures are a small amount of medial extraconal hemorrhage/fluid as well as small amount of lateral intraconal hemorrhage. Hyperdense fluid in the right maxillary sinus would be in keeping with hemorrhage/blood products. Right periorbital soft tissue swelling is noted. 3. No acute intracranial abnormality. 4. Mild cervical spondylosis without acute cervical spine fracture.  09-07-17: right hip x-ray:  1. No acute abnormality of the bony pelvis or the right hip. 2. 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  09-07-17: ct of pelvis:  1. No acute fracture of the pelvis or hips. 2. Bilateral hip AVN and advanced osteoarthritis, findings have progressed in the right hip from CT 6 months prior.  10-10-17: pelvic x-ray: Prosthetic components of right total hip replacement well-seated. Loss of bone inferior to the acetabular portion the prosthesis is unchanged. Advanced avascular necrosis left femoral head with remodeling and foci of calcification within the left hip joint. No acute fracture or dislocation.   NO NEW EXAMS     LABS REVIEWED: PREVOUS:   09-07-17: wbc 7.5; hgb 10.8; hct 30.4; mcv 76.2; plt 298; glucose 105; bun 21; creat 1.18; k+ 3.2; na++ 116; ca 8.9 ck 437; tsh 1.378 09-09-17: glucose 98; bun 9; creat 0.82; k+ 4.1; na++ 115; ca 8.0; mag 1.5; phos 1.9; uric 4.2 09-10-17: wbc 7.7; hgb 9.6;  hct 26.6; mcv 75.6 plt 260; glucose 121; bun <5; creat 0.69; k+ 3.4; na++ 112; ca 8.0 09-11-17: wbc 7.1; hgb 9.1; hct 25.8; mcv 75.9; plt 277; glucose 88; bun <5; creat 0.67; k+ 3.5; na++ 118; ca 8.60mag 1.2; phos 3.5  09-11-17: (repeat) glucose 120; bun 6; creat 0.83; k+ 3.8; na++ 130; ca 8.1 09-12-17: wbc 6.9; hgb 8.9; hct 25.2; mcv 78.5; plt 275; glucose 104; bun 6; creat 0.87; k+ 4.1; na++ 130; ca 8.6 10-03-17: glucose 94; bun 9.1; creat 0.77; k+ 4.5; na++ 141; ca 9.4; liver normal albumin 3.6; mag 1.6 10-10-17: wbc 7.0; hgb 10.9; hct 34.7; mcv 84.0; plt 360 10-11-17: glucose 116; bun 12; creat 0.81 ;k+ 4.0; na++ 138; ca 8.2 10-13-17: wbc 7.9; hgb 7.9; hct 24.9; mcv 83.3; plt 159   NO NEW LABS     Review of Systems  Unable to perform ROS: Dementia (confused )    Physical Exam  Constitutional: No distress.  Frail   Neck: No thyromegaly present.  Cardiovascular: Normal rate, regular rhythm and intact distal pulses.  Murmur heard. 1/6  Pulmonary/Chest: Effort normal and breath sounds normal. No respiratory distress.  Abdominal: Soft. Bowel sounds are normal. She exhibits no distension. There is no tenderness.  Musculoskeletal: She exhibits no edema.  Is able to move all extremities Status post right hip replacement   Lymphadenopathy:    She has no cervical adenopathy.  Neurological: She is alert.  Skin: Skin is warm and dry. She is not diaphoretic.  Psychiatric: She has a normal mood and affect.     ASSESSMENT/ PLAN:  TODAY:   1. Avascular necrosis of both hips: is status post right hip replacement will continue therapy as directed; will follow up with orthopedics as indicated. Will continue aleve 440 mg twice vicodin 5/35 mg 1 or 2 tabs every 6 hours as needed    2.  Cognitive delay with vascular dementia: is currently not on medications; will not make changes at this time and will address as indicated current weight is 112 pounds   3. Hyperlipidemia : stable will continue  mevacor 40 mg daily   4. Alcoholism: is currently not consuming alcohol no signs of withdrawal present;will continue folic acid and thiamine  5. Depression with anxiety: is without change: will continue ativan 0.5 mg daily does take for  anxiety; and zoloft 75 mg daily     MD is aware of resident's narcotic use and is in agreement with current plan of care. We will attempt to wean resident as apropriate   Synthia Innocent NP [facility]  Contact [phone] Monday through Friday 8am- 5pm  After hours call [phone]

## 2017-11-21 ENCOUNTER — Inpatient Hospital Stay (HOSPITAL_COMMUNITY): Payer: Medicare Other

## 2017-11-21 ENCOUNTER — Inpatient Hospital Stay (HOSPITAL_COMMUNITY): Payer: Medicare Other | Admitting: Certified Registered"

## 2017-11-21 ENCOUNTER — Inpatient Hospital Stay (HOSPITAL_COMMUNITY)
Admission: RE | Admit: 2017-11-21 | Discharge: 2017-11-25 | DRG: 470 | Disposition: A | Discharge status: Skilled Nursing Facility | Payer: Medicare Other | Source: Ambulatory Visit | Attending: Orthopedic Surgery | Admitting: Orthopedic Surgery

## 2017-11-21 ENCOUNTER — Other Ambulatory Visit: Payer: Self-pay

## 2017-11-21 ENCOUNTER — Encounter (HOSPITAL_COMMUNITY)
Admission: RE | Disposition: A | Discharge status: Skilled Nursing Facility | Payer: Self-pay | Source: Ambulatory Visit | Attending: Orthopedic Surgery

## 2017-11-21 ENCOUNTER — Encounter (HOSPITAL_COMMUNITY): Payer: Self-pay | Admitting: Certified Registered"

## 2017-11-21 DIAGNOSIS — I10 Essential (primary) hypertension: Secondary | ICD-10-CM | POA: Diagnosis present

## 2017-11-21 DIAGNOSIS — Z419 Encounter for procedure for purposes other than remedying health state, unspecified: Secondary | ICD-10-CM

## 2017-11-21 DIAGNOSIS — E785 Hyperlipidemia, unspecified: Secondary | ICD-10-CM | POA: Diagnosis present

## 2017-11-21 DIAGNOSIS — F329 Major depressive disorder, single episode, unspecified: Secondary | ICD-10-CM | POA: Diagnosis present

## 2017-11-21 DIAGNOSIS — D62 Acute posthemorrhagic anemia: Secondary | ICD-10-CM | POA: Diagnosis not present

## 2017-11-21 DIAGNOSIS — M87051 Idiopathic aseptic necrosis of right femur: Secondary | ICD-10-CM | POA: Diagnosis not present

## 2017-11-21 DIAGNOSIS — G8911 Acute pain due to trauma: Secondary | ICD-10-CM | POA: Diagnosis not present

## 2017-11-21 DIAGNOSIS — Z79899 Other long term (current) drug therapy: Secondary | ICD-10-CM

## 2017-11-21 DIAGNOSIS — M87852 Other osteonecrosis, left femur: Secondary | ICD-10-CM | POA: Diagnosis present

## 2017-11-21 DIAGNOSIS — M160 Bilateral primary osteoarthritis of hip: Secondary | ICD-10-CM | POA: Diagnosis present

## 2017-11-21 DIAGNOSIS — R489 Unspecified symbolic dysfunctions: Secondary | ICD-10-CM | POA: Diagnosis present

## 2017-11-21 DIAGNOSIS — Z96641 Presence of right artificial hip joint: Secondary | ICD-10-CM | POA: Diagnosis present

## 2017-11-21 DIAGNOSIS — F0281 Dementia in other diseases classified elsewhere with behavioral disturbance: Secondary | ICD-10-CM | POA: Diagnosis present

## 2017-11-21 DIAGNOSIS — F419 Anxiety disorder, unspecified: Secondary | ICD-10-CM | POA: Diagnosis present

## 2017-11-21 DIAGNOSIS — R6250 Unspecified lack of expected normal physiological development in childhood: Secondary | ICD-10-CM | POA: Diagnosis present

## 2017-11-21 DIAGNOSIS — Z7901 Long term (current) use of anticoagulants: Secondary | ICD-10-CM

## 2017-11-21 DIAGNOSIS — M1612 Unilateral primary osteoarthritis, left hip: Secondary | ICD-10-CM | POA: Diagnosis present

## 2017-11-21 DIAGNOSIS — Z09 Encounter for follow-up examination after completed treatment for conditions other than malignant neoplasm: Secondary | ICD-10-CM

## 2017-11-21 DIAGNOSIS — M87052 Idiopathic aseptic necrosis of left femur: Secondary | ICD-10-CM | POA: Diagnosis not present

## 2017-11-21 DIAGNOSIS — Z471 Aftercare following joint replacement surgery: Secondary | ICD-10-CM | POA: Diagnosis present

## 2017-11-21 DIAGNOSIS — Z96642 Presence of left artificial hip joint: Secondary | ICD-10-CM | POA: Diagnosis not present

## 2017-11-21 DIAGNOSIS — M6281 Muscle weakness (generalized): Secondary | ICD-10-CM | POA: Diagnosis present

## 2017-11-21 DIAGNOSIS — S79911A Unspecified injury of right hip, initial encounter: Secondary | ICD-10-CM | POA: Diagnosis not present

## 2017-11-21 HISTORY — PX: TOTAL HIP ARTHROPLASTY: SHX124

## 2017-11-21 HISTORY — DX: Anemia, unspecified: D64.9

## 2017-11-21 LAB — BASIC METABOLIC PANEL
ANION GAP: 7 (ref 5–15)
BUN: 20 mg/dL (ref 6–20)
CHLORIDE: 108 mmol/L (ref 101–111)
CO2: 27 mmol/L (ref 22–32)
Calcium: 9.4 mg/dL (ref 8.9–10.3)
Creatinine, Ser: 0.86 mg/dL (ref 0.44–1.00)
GFR calc Af Amer: >60 mL/min (ref >60)
GFR calc non Af Amer: >60 mL/min (ref >60)
GLUCOSE: 90 mg/dL (ref 65–99)
POTASSIUM: 4 mmol/L (ref 3.5–5.1)
Sodium: 142 mmol/L (ref 135–145)

## 2017-11-21 LAB — SURGICAL PCR SCREEN
MRSA, PCR: NEGATIVE
Staphylococcus aureus: POSITIVE — AB

## 2017-11-21 LAB — CBC
HEMATOCRIT: 32.9 % — AB (ref 36.0–46.0)
HEMOGLOBIN: 9.8 g/dL — AB (ref 12.0–15.0)
MCH: 23.8 pg — AB (ref 26.0–34.0)
MCHC: 29.8 g/dL — AB (ref 30.0–36.0)
MCV: 79.9 fL (ref 78.0–100.0)
Platelets: 292 10*3/uL (ref 150–400)
RBC: 4.12 MIL/uL (ref 3.87–5.11)
RDW: 14.6 % (ref 11.5–15.5)
WBC: 8.6 10*3/uL (ref 4.0–10.5)

## 2017-11-21 SURGERY — ARTHROPLASTY, HIP, TOTAL, ANTERIOR APPROACH
Anesthesia: General | Site: Hip | Laterality: Left

## 2017-11-21 MED ORDER — METOCLOPRAMIDE HCL 5 MG/ML IJ SOLN: 5.0000 mg | Freq: Three times a day (TID) | INTRAMUSCULAR | Status: DC | PRN

## 2017-11-21 MED ORDER — HYDROCODONE-ACETAMINOPHEN 5-325 MG PO TABS
2.0000 | ORAL_TABLET | ORAL | Status: DC | PRN
  Administered 2017-11-22 – 2017-11-25 (×7): 2 via ORAL
  Filled 2017-11-21 (×7): qty 2

## 2017-11-21 MED ORDER — LORAZEPAM 0.5 MG PO TABS
0.5000 mg | ORAL_TABLET | Freq: Every day | ORAL | Status: DC
  Administered 2017-11-21 – 2017-11-25 (×5): 0.5 mg via ORAL
  Filled 2017-11-21 (×5): qty 1

## 2017-11-21 MED ORDER — ROCURONIUM BROMIDE 10 MG/ML (PF) SYRINGE
PREFILLED_SYRINGE | INTRAVENOUS | Status: DC | PRN
  Administered 2017-11-21: 40 mg via INTRAVENOUS

## 2017-11-21 MED ORDER — SODIUM CHLORIDE 0.9 % IV SOLN: INTRAVENOUS | Status: DC

## 2017-11-21 MED ORDER — PHENOL 1.4 % MT LIQD
1.0000 | OROMUCOSAL | Status: DC | PRN
  Filled 2017-11-21: qty 177

## 2017-11-21 MED ORDER — ACETAMINOPHEN 325 MG PO TABS
650.0000 mg | ORAL_TABLET | ORAL | Status: DC | PRN
  Administered 2017-11-23 – 2017-11-25 (×2): 650 mg via ORAL
  Filled 2017-11-21 (×2): qty 2

## 2017-11-21 MED ORDER — HYDROMORPHONE HCL 1 MG/ML IJ SOLN: 0.5000 mg | INTRAMUSCULAR | Status: DC | PRN

## 2017-11-21 MED ORDER — SODIUM CHLORIDE 0.9 % IV SOLN
INTRAVENOUS | Status: DC
  Administered 2017-11-21: 17:00:00 via INTRAVENOUS

## 2017-11-21 MED ORDER — HYDROCODONE-ACETAMINOPHEN 5-325 MG PO TABS
1.0000 | ORAL_TABLET | ORAL | Status: DC | PRN
  Administered 2017-11-21 – 2017-11-23 (×4): 1 via ORAL
  Filled 2017-11-21 (×4): qty 1

## 2017-11-21 MED ORDER — SENNA 8.6 MG PO TABS
2.0000 | ORAL_TABLET | Freq: Every day | ORAL | Status: DC
  Administered 2017-11-21 – 2017-11-22 (×2): 17.2 mg via ORAL
  Filled 2017-11-21 (×2): qty 2

## 2017-11-21 MED ORDER — DEXAMETHASONE SODIUM PHOSPHATE 10 MG/ML IJ SOLN: 10.0000 mg | Freq: Once | INTRAMUSCULAR | Status: DC

## 2017-11-21 MED ORDER — LIDOCAINE 2% (20 MG/ML) 5 ML SYRINGE
INTRAMUSCULAR | Status: DC | PRN
  Administered 2017-11-21: 100 mg via INTRAVENOUS

## 2017-11-21 MED ORDER — METHOCARBAMOL 500 MG PO TABS
500.0000 mg | ORAL_TABLET | Freq: Four times a day (QID) | ORAL | Status: DC | PRN
  Administered 2017-11-23 – 2017-11-25 (×5): 500 mg via ORAL
  Filled 2017-11-21 (×5): qty 1

## 2017-11-21 MED ORDER — SUGAMMADEX SODIUM 200 MG/2ML IV SOLN
INTRAVENOUS | Status: AC
  Filled 2017-11-21: qty 2

## 2017-11-21 MED ORDER — ONDANSETRON HCL 4 MG/2ML IJ SOLN
INTRAMUSCULAR | Status: DC | PRN
  Administered 2017-11-21: 4 mg via INTRAVENOUS

## 2017-11-21 MED ORDER — FENTANYL CITRATE (PF) 100 MCG/2ML IJ SOLN
INTRAMUSCULAR | Status: DC | PRN
  Administered 2017-11-21 (×4): 50 ug via INTRAVENOUS

## 2017-11-21 MED ORDER — ONDANSETRON HCL 4 MG PO TABS: 4.0000 mg | ORAL_TABLET | Freq: Four times a day (QID) | ORAL | Status: DC | PRN

## 2017-11-21 MED ORDER — ACETAMINOPHEN 650 MG RE SUPP: 650.0000 mg | RECTAL | Status: DC | PRN

## 2017-11-21 MED ORDER — ONDANSETRON HCL 4 MG/2ML IJ SOLN
4.0000 mg | Freq: Four times a day (QID) | INTRAMUSCULAR | Status: DC | PRN
Start: 2017-11-21 — End: 2017-11-25

## 2017-11-21 MED ORDER — METOCLOPRAMIDE HCL 5 MG PO TABS: 5.0000 mg | ORAL_TABLET | Freq: Three times a day (TID) | ORAL | Status: DC | PRN

## 2017-11-21 MED ORDER — DEXAMETHASONE SODIUM PHOSPHATE 10 MG/ML IJ SOLN
INTRAMUSCULAR | Status: AC
  Filled 2017-11-21: qty 1

## 2017-11-21 MED ORDER — METHOCARBAMOL 1000 MG/10ML IJ SOLN
500.0000 mg | Freq: Four times a day (QID) | INTRAVENOUS | Status: DC | PRN
  Filled 2017-11-21: qty 5

## 2017-11-21 MED ORDER — BUPIVACAINE-EPINEPHRINE 0.25% -1:200000 IJ SOLN
INTRAMUSCULAR | Status: AC
  Filled 2017-11-21: qty 1

## 2017-11-21 MED ORDER — SODIUM CHLORIDE 0.9 % IJ SOLN
INTRAMUSCULAR | Status: AC
  Filled 2017-11-21: qty 50

## 2017-11-21 MED ORDER — DIPHENHYDRAMINE HCL 12.5 MG/5ML PO ELIX: 12.5000 mg | ORAL_SOLUTION | ORAL | Status: DC | PRN

## 2017-11-21 MED ORDER — KETOROLAC TROMETHAMINE 30 MG/ML IJ SOLN
INTRAMUSCULAR | Status: AC
  Filled 2017-11-21: qty 1

## 2017-11-21 MED ORDER — FENTANYL CITRATE (PF) 100 MCG/2ML IJ SOLN
INTRAMUSCULAR | Status: AC
  Filled 2017-11-21: qty 2

## 2017-11-21 MED ORDER — ACETAMINOPHEN 10 MG/ML IV SOLN
1000.0000 mg | INTRAVENOUS | Status: AC
  Administered 2017-11-21: 1000 mg via INTRAVENOUS
  Filled 2017-11-21: qty 100

## 2017-11-21 MED ORDER — DEXAMETHASONE SODIUM PHOSPHATE 10 MG/ML IJ SOLN
INTRAMUSCULAR | Status: DC | PRN
  Administered 2017-11-21: 10 mg via INTRAVENOUS

## 2017-11-21 MED ORDER — APIXABAN 2.5 MG PO TABS: 2.5000 mg | ORAL_TABLET | Freq: Two times a day (BID) | ORAL | 0 refills | Status: DC

## 2017-11-21 MED ORDER — LACTATED RINGERS IV SOLN
INTRAVENOUS | Status: DC
  Administered 2017-11-21 (×3): via INTRAVENOUS

## 2017-11-21 MED ORDER — WATER FOR IRRIGATION, STERILE IR SOLN
Status: DC | PRN
  Administered 2017-11-21: 2000 mL via SURGICAL_CAVITY

## 2017-11-21 MED ORDER — HYDRALAZINE HCL 20 MG/ML IJ SOLN
INTRAMUSCULAR | Status: DC | PRN
  Administered 2017-11-21 (×2): 4 mg via INTRAVENOUS

## 2017-11-21 MED ORDER — MENTHOL 3 MG MT LOZG: 1.0000 | LOZENGE | OROMUCOSAL | Status: DC | PRN

## 2017-11-21 MED ORDER — CHLORHEXIDINE GLUCONATE 4 % EX LIQD: 60.0000 mL | Freq: Once | CUTANEOUS | Status: DC

## 2017-11-21 MED ORDER — ROCURONIUM BROMIDE 10 MG/ML (PF) SYRINGE
PREFILLED_SYRINGE | INTRAVENOUS | Status: AC
  Filled 2017-11-21: qty 5

## 2017-11-21 MED ORDER — TRANEXAMIC ACID 1000 MG/10ML IV SOLN
1000.0000 mg | Freq: Once | INTRAVENOUS | Status: AC
  Administered 2017-11-21: 1000 mg via INTRAVENOUS
  Filled 2017-11-21: qty 1100

## 2017-11-21 MED ORDER — OXYCODONE HCL 5 MG PO TABS: 5.0000 mg | ORAL_TABLET | Freq: Once | ORAL | Status: DC | PRN

## 2017-11-21 MED ORDER — OXYCODONE HCL 5 MG/5ML PO SOLN
5.0000 mg | Freq: Once | ORAL | Status: DC | PRN
  Filled 2017-11-21: qty 5

## 2017-11-21 MED ORDER — MIDAZOLAM HCL 5 MG/5ML IJ SOLN
INTRAMUSCULAR | Status: DC | PRN
  Administered 2017-11-21 (×2): 1 mg via INTRAVENOUS

## 2017-11-21 MED ORDER — BUPIVACAINE-EPINEPHRINE 0.25% -1:200000 IJ SOLN
INTRAMUSCULAR | Status: DC | PRN
  Administered 2017-11-21: 30 mL

## 2017-11-21 MED ORDER — LIDOCAINE 2% (20 MG/ML) 5 ML SYRINGE
INTRAMUSCULAR | Status: AC
  Filled 2017-11-21: qty 5

## 2017-11-21 MED ORDER — EPHEDRINE 5 MG/ML INJ
INTRAVENOUS | Status: AC
  Filled 2017-11-21: qty 10

## 2017-11-21 MED ORDER — DOCUSATE SODIUM 100 MG PO CAPS
100.0000 mg | ORAL_CAPSULE | Freq: Two times a day (BID) | ORAL | Status: DC
  Administered 2017-11-21 – 2017-11-25 (×6): 100 mg via ORAL
  Filled 2017-11-21 (×6): qty 1

## 2017-11-21 MED ORDER — PRAVASTATIN SODIUM 20 MG PO TABS
40.0000 mg | ORAL_TABLET | Freq: Every day | ORAL | Status: DC
  Administered 2017-11-21 – 2017-11-24 (×4): 40 mg via ORAL
  Filled 2017-11-21 (×4): qty 2

## 2017-11-21 MED ORDER — SUGAMMADEX SODIUM 200 MG/2ML IV SOLN
INTRAVENOUS | Status: DC | PRN
  Administered 2017-11-21: 100 mg via INTRAVENOUS

## 2017-11-21 MED ORDER — SODIUM CHLORIDE 0.9 % IR SOLN
Status: DC | PRN
  Administered 2017-11-21: 4000 mL

## 2017-11-21 MED ORDER — VITAMIN B-1 100 MG PO TABS
100.0000 mg | ORAL_TABLET | Freq: Every day | ORAL | Status: DC
  Administered 2017-11-21 – 2017-11-25 (×5): 100 mg via ORAL
  Filled 2017-11-21 (×5): qty 1

## 2017-11-21 MED ORDER — ISOPROPYL ALCOHOL 70 % SOLN
Status: DC | PRN
  Administered 2017-11-21: 1 via TOPICAL

## 2017-11-21 MED ORDER — APIXABAN 2.5 MG PO TABS
2.5000 mg | ORAL_TABLET | Freq: Two times a day (BID) | ORAL | Status: DC
  Administered 2017-11-22 – 2017-11-25 (×7): 2.5 mg via ORAL
  Filled 2017-11-21 (×7): qty 1

## 2017-11-21 MED ORDER — ALUM & MAG HYDROXIDE-SIMETH 200-200-20 MG/5ML PO SUSP: 30.0000 mL | ORAL | Status: DC | PRN

## 2017-11-21 MED ORDER — ONDANSETRON HCL 4 MG/2ML IJ SOLN
INTRAMUSCULAR | Status: AC
  Filled 2017-11-21: qty 2

## 2017-11-21 MED ORDER — FOLIC ACID 1 MG PO TABS
1.0000 mg | ORAL_TABLET | Freq: Every day | ORAL | Status: DC
  Administered 2017-11-21 – 2017-11-25 (×5): 1 mg via ORAL
  Filled 2017-11-21 (×4): qty 1

## 2017-11-21 MED ORDER — PROPOFOL 10 MG/ML IV BOLUS
INTRAVENOUS | Status: AC
  Filled 2017-11-21: qty 20

## 2017-11-21 MED ORDER — POVIDONE-IODINE 10 % EX SWAB
2.0000 "application " | Freq: Once | CUTANEOUS | Status: AC
  Administered 2017-11-21: 2 via TOPICAL

## 2017-11-21 MED ORDER — HYDRALAZINE HCL 20 MG/ML IJ SOLN
INTRAMUSCULAR | Status: AC
  Filled 2017-11-21: qty 1

## 2017-11-21 MED ORDER — FENTANYL CITRATE (PF) 100 MCG/2ML IJ SOLN: 25.0000 ug | INTRAMUSCULAR | Status: DC | PRN

## 2017-11-21 MED ORDER — MIDAZOLAM HCL 2 MG/2ML IJ SOLN
INTRAMUSCULAR | Status: AC
  Filled 2017-11-21: qty 2

## 2017-11-21 MED ORDER — KETOROLAC TROMETHAMINE 15 MG/ML IJ SOLN
7.5000 mg | Freq: Four times a day (QID) | INTRAMUSCULAR | Status: AC
  Administered 2017-11-21: 7.5 mg via INTRAVENOUS
  Filled 2017-11-21: qty 1

## 2017-11-21 MED ORDER — CEFAZOLIN SODIUM-DEXTROSE 1-4 GM/50ML-% IV SOLN
1.0000 g | Freq: Four times a day (QID) | INTRAVENOUS | Status: AC
  Administered 2017-11-21 (×2): 1 g via INTRAVENOUS
  Filled 2017-11-21 (×2): qty 50

## 2017-11-21 MED ORDER — ONDANSETRON HCL 4 MG/2ML IJ SOLN: 4.0000 mg | Freq: Four times a day (QID) | INTRAMUSCULAR | Status: DC | PRN

## 2017-11-21 MED ORDER — PROPOFOL 10 MG/ML IV BOLUS
INTRAVENOUS | Status: DC | PRN
  Administered 2017-11-21: 140 mg via INTRAVENOUS

## 2017-11-21 MED ORDER — CEFAZOLIN SODIUM-DEXTROSE 2-4 GM/100ML-% IV SOLN
2.0000 g | INTRAVENOUS | Status: AC
  Administered 2017-11-21: 2 g via INTRAVENOUS
  Filled 2017-11-21: qty 100

## 2017-11-21 MED ORDER — ISOPROPYL ALCOHOL 70 % SOLN
Status: AC
  Filled 2017-11-21: qty 480

## 2017-11-21 MED ORDER — HYDROCODONE-ACETAMINOPHEN 5-325 MG PO TABS: 1.0000 | ORAL_TABLET | ORAL | 0 refills | Status: DC | PRN

## 2017-11-21 MED ORDER — POLYETHYLENE GLYCOL 3350 17 G PO PACK: 17.0000 g | PACK | Freq: Every day | ORAL | Status: DC | PRN

## 2017-11-21 MED ORDER — EPHEDRINE SULFATE-NACL 50-0.9 MG/10ML-% IV SOSY
PREFILLED_SYRINGE | INTRAVENOUS | Status: DC | PRN
  Administered 2017-11-21: 5 mg via INTRAVENOUS

## 2017-11-21 MED ORDER — SERTRALINE HCL 50 MG PO TABS
75.0000 mg | ORAL_TABLET | Freq: Every day | ORAL | Status: DC
  Administered 2017-11-21 – 2017-11-25 (×5): 75 mg via ORAL
  Filled 2017-11-21 (×5): qty 1

## 2017-11-21 MED ORDER — SODIUM CHLORIDE 0.9 % IJ SOLN
INTRAMUSCULAR | Status: DC | PRN
  Administered 2017-11-21: 30 mL

## 2017-11-21 MED ORDER — KETOROLAC TROMETHAMINE 30 MG/ML IJ SOLN
INTRAMUSCULAR | Status: DC | PRN
  Administered 2017-11-21: 30 mg

## 2017-11-21 SURGICAL SUPPLY — 46 items
ADH SKN CLS APL DERMABOND .7 (GAUZE/BANDAGES/DRESSINGS) ×2
BAG DECANTER FOR FLEXI CONT (MISCELLANEOUS) IMPLANT
BAG SPEC THK2 15X12 ZIP CLS (MISCELLANEOUS) ×1
BAG ZIPLOCK 12X15 (MISCELLANEOUS) ×2 IMPLANT
CAPT HIP TOTAL 2 ×2 IMPLANT
CHLORAPREP W/TINT 26ML (MISCELLANEOUS) ×5 IMPLANT
CLOTH BEACON ORANGE TIMEOUT ST (SAFETY) ×1 IMPLANT
COVER PERINEAL POST (MISCELLANEOUS) ×3 IMPLANT
COVER SURGICAL LIGHT HANDLE (MISCELLANEOUS) ×3 IMPLANT
DECANTER SPIKE VIAL GLASS SM (MISCELLANEOUS) ×3 IMPLANT
DERMABOND ADVANCED (GAUZE/BANDAGES/DRESSINGS) ×4
DERMABOND ADVANCED .7 DNX12 (GAUZE/BANDAGES/DRESSINGS) ×2 IMPLANT
DRAPE SHEET LG 3/4 BI-LAMINATE (DRAPES) ×9 IMPLANT
DRAPE STERI IOBAN 125X83 (DRAPES) ×3 IMPLANT
DRAPE U-SHAPE 47X51 STRL (DRAPES) ×6 IMPLANT
DRSG AQUACEL AG ADV 3.5X10 (GAUZE/BANDAGES/DRESSINGS) ×3 IMPLANT
ELECT PENCIL ROCKER SW 15FT (MISCELLANEOUS) ×3 IMPLANT
ELECT REM PT RETURN 15FT ADLT (MISCELLANEOUS) ×3 IMPLANT
GAUZE SPONGE 4X4 12PLY STRL (GAUZE/BANDAGES/DRESSINGS) ×3 IMPLANT
GLOVE BIO SURGEON STRL SZ8.5 (GLOVE) ×6 IMPLANT
GLOVE BIOGEL PI IND STRL 8.5 (GLOVE) ×1 IMPLANT
GLOVE BIOGEL PI INDICATOR 8.5 (GLOVE) ×2
GOWN SPEC L3 XXLG W/TWL (GOWN DISPOSABLE) ×3 IMPLANT
HANDPIECE INTERPULSE COAX TIP (DISPOSABLE) ×3
HOLDER FOLEY CATH W/STRAP (MISCELLANEOUS) ×3 IMPLANT
HOOD PEEL AWAY FLYTE STAYCOOL (MISCELLANEOUS) ×10 IMPLANT
MARKER SKIN DUAL TIP RULER LAB (MISCELLANEOUS) ×3 IMPLANT
NDL SPNL 18GX3.5 QUINCKE PK (NEEDLE) ×1 IMPLANT
NEEDLE SPNL 18GX3.5 QUINCKE PK (NEEDLE) ×3 IMPLANT
PACK ANTERIOR HIP CUSTOM (KITS) ×3 IMPLANT
SAW OSC TIP CART 19.5X105X1.3 (SAW) ×3 IMPLANT
SEALER BIPOLAR AQUA 6.0 (INSTRUMENTS) ×3 IMPLANT
SET HNDPC FAN SPRY TIP SCT (DISPOSABLE) ×1 IMPLANT
SUT ETHIBOND NAB CT1 #1 30IN (SUTURE) ×6 IMPLANT
SUT MNCRL AB 3-0 PS2 18 (SUTURE) ×3 IMPLANT
SUT MON AB 2-0 CT1 36 (SUTURE) ×6 IMPLANT
SUT STRATAFIX PDO 1 14 VIOLET (SUTURE) ×3
SUT STRATFX PDO 1 14 VIOLET (SUTURE) ×1
SUT VIC AB 2-0 CT1 27 (SUTURE) ×3
SUT VIC AB 2-0 CT1 TAPERPNT 27 (SUTURE) ×1 IMPLANT
SUTURE STRATFX PDO 1 14 VIOLET (SUTURE) ×1 IMPLANT
SYR 50ML LL SCALE MARK (SYRINGE) ×1 IMPLANT
TRAY FOLEY CATH 14FRSI W/METER (CATHETERS) ×2 IMPLANT
TRAY FOLEY W/METER SILVER 16FR (SET/KITS/TRAYS/PACK) IMPLANT
WATER STERILE IRR 1000ML POUR (IV SOLUTION) ×5 IMPLANT
YANKAUER SUCT BULB TIP 10FT TU (MISCELLANEOUS) ×3 IMPLANT

## 2017-11-21 NOTE — Plan of Care (Signed)
Plan of care discussed.   

## 2017-11-21 NOTE — Progress Notes (Signed)
Patient very restless.  Nurse unable to understand much of what she says.   Medicated for pain, but patient constantly removing oxygen, pulse ox and pulling at IV.  IV arm was wrapped with gauze.  Patient removed SCDs.  O2 at 99% without oxygen.   Will monitor through the night.

## 2017-11-21 NOTE — Progress Notes (Signed)
Per Carretta, pt has not taken eliquis since January.

## 2017-11-21 NOTE — Progress Notes (Signed)
Sp[facility] with [place], [facility]. Confirmed last time pt took medications, and that she has been NPO after midnight. Last time solid food was eaten was yesterday at 1700 and had liquids last at 1930.

## 2017-11-21 NOTE — Progress Notes (Signed)
Patient pulled IV out even with the gauze wrap.  Moved to 1601 for observation closer to nursing station, placing a sitter with her when possible.

## 2017-11-21 NOTE — Plan of Care (Signed)
Moving well in the bed but extremely agitated.  Gave am dose of Ativan

## 2017-11-21 NOTE — Anesthesia Preprocedure Evaluation (Signed)
Anesthesia Evaluation  Patient identified by MRN, date of birth, ID band Patient awake    Reviewed: Allergy & Precautions, H&P , NPO status , Patient's Chart, lab work & pertinent test results  Airway Mallampati: II   Neck ROM: full    Dental   Pulmonary neg pulmonary ROS,    breath sounds clear to auscultation       Cardiovascular hypertension,  Rhythm:regular Rate:Normal     Neuro/Psych PSYCHIATRIC DISORDERS Anxiety    GI/Hepatic (+)     substance abuse  alcohol use,   Endo/Other    Renal/GU      Musculoskeletal   Abdominal   Peds  Hematology  (+) anemia ,   Anesthesia Other Findings Pt not a candidate for SAB. Developmental delay, ETOH abuse.  Reproductive/Obstetrics                             Anesthesia Physical Anesthesia Plan  ASA: II  Anesthesia Plan: General   Post-op Pain Management:    Induction: Intravenous  PONV Risk Score and Plan: 3 and Ondansetron, Dexamethasone, Midazolam and Treatment may vary due to age or medical condition  Airway Management Planned: Oral ETT  Additional Equipment:   Intra-op Plan:   Post-operative Plan: Extubation in OR  Informed Consent: I have reviewed the patients History and Physical, chart, labs and discussed the procedure including the risks, benefits and alternatives for the proposed anesthesia with the patient or authorized representative who has indicated his/her understanding and acceptance.     Plan Discussed with: CRNA, Anesthesiologist and Surgeon  Anesthesia Plan Comments:         Anesthesia Quick Evaluation

## 2017-11-21 NOTE — Op Note (Signed)
OPERATIVE REPORT  SURGEON: Samson FredericBrian Sameeha Rockefeller, MD   ASSISTANT: Skip MayerBlair Roberts, PA-C.  PREOPERATIVE DIAGNOSIS: Left hip arthritis.   POSTOPERATIVE DIAGNOSIS: Left hip arthritis.   PROCEDURE: Left total hip arthroplasty, anterior approach.   IMPLANTS: DePuy Tri Lock stem, size 2, std offset. DePuy Pinnacle Cup, size 52 mm. DePuy Altrx liner, size 36 by 52 mm, neutral. DePuy Biolox ceramic head ball, size 36 + 1.5 mm. 6.5 mm cancellous bone screw x1.  ANESTHESIA:  General  ESTIMATED BLOOD LOSS: 300 mL.  ANTIBIOTICS: 2 g Ancef.  DRAINS: None.  COMPLICATIONS: None.   CONDITION: PACU - hemodynamically stable.   BRIEF CLINICAL NOTE: Norma Ayers is a 67 y.o. female with a long-standing history of Left hip arthritis. After failing conservative management, the patient was indicated for total hip arthroplasty. The risks, benefits, and alternatives to the procedure were explained, and the patient elected to proceed.  PROCEDURE IN DETAIL: Surgical site was marked by myself in the pre-op holding area. Once inside the operating room, spinal anesthesia was obtained, and a foley catheter was inserted. The patient was then positioned on the Hana table. All bony prominences were well padded. The hip was prepped and draped in the normal sterile surgical fashion. A time-out was called verifying side and site of surgery. The patient received IV antibiotics within 60 minutes of beginning the procedure.  The direct anterior approach to the hip was performed through the Hueter interval. Lateral femoral circumflex vessels were treated with the Auqumantys. The anterior capsule was exposed and an inverted T capsulotomy was made.The femoral neck cut was made to the level of the templated cut. A corkscrew was placed into the head and the head was removed. The femoral head was found to have eburnated bone. The head was passed to the back table and was measured.  Acetabular exposure was achieved,  and the pulvinar and labrum were excised. Sequential reaming of the acetabulum was then performed up to a size 51 mm reamer. A 52 mm cup was then opened and impacted into place at approximately 40 degrees of abduction and 20 degrees of anteversion.  I elected to augment the already acceptable press-fit fixation with a single 6.5 mm cancellous bone screw.  The final polyethylene liner was impacted into place and acetabular osteophytes were removed.   I then gained femoral exposure taking care to protect the abductors and greater trochanter. This was performed using standard external rotation, extension, and adduction. The capsule was peeled off the inner aspect of the greater trochanter, taking care to preserve the short external rotators. A cookie cutter was used to enter the femoral canal, and then the femoral canal finder was placed. Sequential broaching was performed up to a size 2. Calcar planer was used on the femoral neck remnant. I placed a std offset neck and a trial head ball. The hip was reduced. Leg lengths and offset were checked fluoroscopically. The hip was dislocated and trial components were removed. The final implants were placed, and the hip was reduced.  Fluoroscopy was used to confirm component position and leg lengths. At 90 degrees of external rotation and full extension, the hip was stable to an anterior directed force.  The wound was copiously irrigated with normal saline using pulse lavage. Marcaine solution was injected into the periarticular soft tissue. The wound was closed in layers using #1 Vicryl and V-Loc for the fascia, 2-0 Vicryl for the subcutaneous fat, 2-0 Monocryl for the deep dermal layer, 3-0 running Monocryl subcuticular stitch, and  Dermabond for the skin. Once the glue was fully dried, an Aquacell Ag dressing was applied. The patient was transported to the recovery room in stable condition. Sponge, needle, and instrument counts were correct at the end of  the case x2. The patient tolerated the procedure well and there were no known complications.  Please note that a surgical assistant was a medical necessity for this procedure to perform it in a safe and expeditious manner. Assistant was necessary to provide appropriate retraction of vital neurovascular structures, to prevent femoral fracture, and to allow for anatomic placement of the prosthesis.

## 2017-11-21 NOTE — Discharge Summary (Signed)
Physician Discharge Summary  Patient ID: Norma LeydenBetty L Norkus MRN: [id number] DOB/AGE: 69/02/1951 69 y.o.  Admit date: 11/21/2017 Discharge date: 11/25/2017  Admission Diagnoses:  Avascular necrosis of hip, left [facility](HCC)  Discharge Diagnoses:  Principal Problem:   Avascular necrosis of hip, left (HCC) Active Problems:   Osteoarthritis of left hip   Past Medical History:  Diagnosis Date  . Alcoholism (HCC) 09/08/2017  . Anemia   . Avascular necrosis of bones of both hips (HCC) 09/08/2017  . Cognitive developmental delay 09/08/2017  . Hyperlipidemia   . Hypertension   . Right orbit fracture (HCC) 09/08/2017  . Tongue lesion 09/08/2017    Surgeries: Procedure(s): LEFT TOTAL HIP ARTHROPLASTY ANTERIOR APPROACH on 11/21/2017   Consultants (if any):   Discharged Condition: Improved  Hospital Course: Norma Ayers is an 67 y.o. female who was admitted 11/21/2017 with a diagnosis of Avascular necrosis of hip, left (HCC) and went to the operating room on 11/21/2017 and underwent the above named procedures.    She was given perioperative antibiotics:  Anti-infectives (From admission, onward)   Start     Dose/Rate Route Frequency Ordered Stop   11/21/17 1600  ceFAZolin (ANCEF) IVPB 1 g/50 mL premix     1 g 100 mL/hr over 30 Minutes Intravenous Every 6 hours 11/21/17 1447 11/21/17 2141   11/21/17 0730  ceFAZolin (ANCEF) IVPB 2g/100 mL premix     2 g 200 mL/hr over 30 Minutes Intravenous On call to O.R. 11/21/17 [id number] 11/21/17 1001    . She required 2 units PRBCs for symptomatic anemia with hgb of 7.6 whit excellent response to 9.7.  She was given sequential compression devices, early ambulation, and apixaban for DVT prophylaxis.  She benefited maximally from the hospital stay and there were no complications.    Recent vital signs:  Vitals:   11/24/17 1330 11/25/17 0603  BP: (!) 148/80 (!) 151/71  Pulse: 80 86  Resp: 20   Temp: 97.6 F (36.4 C)   SpO2: 99%     Recent laboratory  studies:  Lab Results  Component Value Date   HGB 9.7 (L) 11/25/2017   HGB 9.5 (L) 11/24/2017   HGB 7.6 (L) 11/23/2017   Lab Results  Component Value Date   WBC 9.4 11/25/2017   PLT 263 11/25/2017   No results found for: INR Lab Results  Component Value Date   NA 138 11/22/2017   K 4.2 11/22/2017   CL 108 11/22/2017   CO2 25 11/22/2017   BUN 17 11/22/2017   CREATININE 0.88 11/22/2017   GLUCOSE 98 11/22/2017    Discharge Medications:   Allergies as of 11/25/2017   No Known Allergies     Medication List    TAKE these medications   apixaban 2.5 MG Tabs tablet Commonly known as:  ELIQUIS Take 1 tablet (2.5 mg total) by mouth 2 (two) times daily.   docusate sodium 100 MG capsule Commonly known as:  COLACE Take 1 capsule (100 mg total) by mouth 2 (two) times daily.   folic acid 1 MG tablet Commonly known as:  FOLVITE Take 1 tablet (1 mg total) by mouth daily.   HYDROcodone-acetaminophen 5-325 MG tablet Commonly known as:  NORCO Take 1-2 tablets by mouth every 4 (four) hours as needed for moderate pain. What changed:    how much to take  when to take this  reasons to take this  Another medication with the same name was removed. Continue taking this medication, and follow the directions  you see here.   LORazepam 0.5 MG tablet Commonly known as:  ATIVAN Give 1 tablet by mouth every morning   lovastatin 40 MG tablet Commonly known as:  MEVACOR Take 40 mg by mouth daily.   naproxen sodium 220 MG tablet Commonly known as:  ALEVE Take 440 mg by mouth 2 (two) times daily.   NON FORMULARY 2 (two) times daily. House Supplement   ondansetron 4 MG tablet Commonly known as:  ZOFRAN Take 1 tablet (4 mg total) by mouth every 6 (six) hours as needed for nausea.   senna 8.6 MG Tabs tablet Commonly known as:  SENOKOT Take 2 tablets (17.2 mg total) by mouth at bedtime.   sertraline 50 MG tablet Commonly known as:  ZOLOFT Take 75 mg by mouth daily.   thiamine  100 MG tablet Take 1 tablet (100 mg total) by mouth daily.       Diagnostic Studies: Dg Pelvis Portable  Result Date: 11/21/2017 CLINICAL DATA:  Status post left hip arthroplasty EXAM: PORTABLE PELVIS 1-2 VIEWS COMPARISON:  Intraoperative films from earlier in the same day. FINDINGS: Bilateral hip replacements are noted in satisfactory position. Small bony densities noted along the superior aspect of the acetabular component on the left. No other bony abnormality is seen. IMPRESSION: Status post left hip replacement. Small bony densities noted adjacent to the acetabulum of uncertain chronicity. Electronically Signed   By: Alcide Clever M.D.   On: 11/21/2017 13:18   Dg C-arm 1-60 Min-no Report  Result Date: 11/21/2017 Fluoroscopy was utilized by the requesting physician.  No radiographic interpretation.   Dg Hip Operative Unilat With Pelvis Left  Result Date: 11/21/2017 CLINICAL DATA:  Left total hip arthroplasty. EXAM: OPERATIVE left HIP (WITH PELVIS IF PERFORMED) 4 VIEWS TECHNIQUE: Fluoroscopic spot image(s) were submitted for interpretation post-operatively. COMPARISON:  10/10/2017 FINDINGS: Well seated components of a total left hip arthroplasty. No complicating features are demonstrated. IMPRESSION: Left hip prosthesis in good position without complicating features. Electronically Signed   By: Rudie Meyer M.D.   On: 11/21/2017 12:15    Disposition: 03-Skilled Nursing Facility  Discharge Instructions    Call MD / Call 911   Complete by:  As directed    If you experience chest pain or shortness of breath, CALL 911 and be transported to the hospital emergency room.  If you develope a fever above 101 F, pus (white drainage) or increased drainage or redness at the wound, or calf pain, call your surgeon's office.   Constipation Prevention   Complete by:  As directed    Drink plenty of fluids.  Prune juice may be helpful.  You may use a stool softener, such as Colace (over the counter) 100 mg  twice a day.  Use MiraLax (over the counter) for constipation as needed.   Diet - low sodium heart healthy   Complete by:  As directed    Discharge instructions   Complete by:  As directed    See discharge instructions   Driving restrictions   Complete by:  As directed    No driving   Increase activity slowly as tolerated   Complete by:  As directed    Lifting restrictions   Complete by:  As directed    No lifting for 6 weeks   TED hose   Complete by:  As directed    Use stockings (TED hose) for 2 weeks on both leg(s).  You may remove them at night for sleeping.  Contact information for follow-up providers    Doroteo Nickolson, Arlys John, MD. Schedule an appointment as soon as possible for a visit in 2 weeks.   Specialty:  Orthopedic Surgery Why:  For wound re-check Contact information: [address] [place] [address] [phone]            Contact information for after-discharge care    Destination    [facility] .   Service:  Skilled Nursing Contact information: [address] [place] [address] [phone]                  Signed: Iline Oven Elester Apodaca 11/25/2017, 10:48 AM

## 2017-11-21 NOTE — Anesthesia Procedure Notes (Signed)
Procedure Name: Intubation Date/Time: 11/21/2017 9:59 AM Performed by: Azreal Stthomas D, CRNA Pre-anesthesia Checklist: Patient identified, Emergency Drugs available, Suction available and Patient being monitored Patient Re-evaluated:Patient Re-evaluated prior to induction Oxygen Delivery Method: Circle system utilized Preoxygenation: Pre-oxygenation with 100% oxygen Induction Type: IV induction Ventilation: Mask ventilation without difficulty Laryngoscope Size: Mac and 3 Grade View: Grade I Tube type: Oral Tube size: 7.5 mm Number of attempts: 1 Airway Equipment and Method: Stylet Placement Confirmation: ETT inserted through vocal cords under direct vision,  positive ETCO2 and breath sounds checked- equal and bilateral Secured at: 21 cm Tube secured with: Tape Dental Injury: Teeth and Oropharynx as per pre-operative assessment

## 2017-11-21 NOTE — Interval H&P Note (Signed)
History and Physical Interval Note:  11/21/2017 7:42 AM  Norma LeydenBetty L Brougher  has presented today for surgery, with the diagnosis of Avascular necrosis left hip  The various methods of treatment have been discussed with the patient and family. After consideration of risks, benefits and other options for treatment, the patient has consented to  Procedure(s) with comments: LEFT TOTAL HIP ARTHROPLASTY ANTERIOR APPROACH (Left) - Needs RNFA as a surgical intervention .  The patient's history has been reviewed, patient examined, no change in status, stable for surgery.  I have reviewed the patient's chart and labs.  Questions were answered to the patient's satisfaction.     Iline OvenBrian J Evalee Gerard

## 2017-11-21 NOTE — Transfer of Care (Signed)
Immediate Anesthesia Transfer of Care Note  Patient: Norma Ayers  Procedure(s) Performed: LEFT TOTAL HIP ARTHROPLASTY ANTERIOR APPROACH (Left Hip)  Patient Location: PACU  Anesthesia Type:General  Level of Consciousness: awake, alert  and oriented  Airway & Oxygen Therapy: Patient Spontanous Breathing and Patient connected to face mask oxygen  Post-op Assessment: Report given to RN and Post -op Vital signs reviewed and stable  Post vital signs: Reviewed and stable  Last Vitals:  Vitals:   11/21/17 0734 11/21/17 1230  BP: (!) 150/84 135/70  Pulse: 63 93  Resp: 16 10  Temp: 37.2 C 36.7 C  SpO2: 99% 100%    Last Pain:  Vitals:   11/21/17 0734  TempSrc: Oral      Patients Stated Pain Goal: 4 (11/21/17 0800)  Complications: No apparent anesthesia complications

## 2017-11-21 NOTE — Anesthesia Postprocedure Evaluation (Signed)
Anesthesia Post Note  Patient: Sharmon LeydenBetty L Groom  Procedure(s) Performed: LEFT TOTAL HIP ARTHROPLASTY ANTERIOR APPROACH (Left Hip)     Patient location during evaluation: PACU Anesthesia Type: General Level of consciousness: awake and alert Pain management: pain level controlled Vital Signs Assessment: post-procedure vital signs reviewed and stable Respiratory status: spontaneous breathing, nonlabored ventilation, respiratory function stable and patient connected to nasal cannula oxygen Cardiovascular status: blood pressure returned to baseline and stable Postop Assessment: no apparent nausea or vomiting Anesthetic complications: no    Last Vitals:  Vitals:   11/21/17 1413 11/21/17 1415  BP: 131/72 131/72  Pulse:  71  Resp: 12 12  Temp: 36.8 C 36.8 C  SpO2: 100% 100%    Last Pain:  Vitals:   11/21/17 1415  TempSrc: Oral                 Ever Halberg DAVID

## 2017-11-22 ENCOUNTER — Other Ambulatory Visit: Payer: Self-pay

## 2017-11-22 LAB — CBC
HCT: 24.8 % — ABNORMAL LOW (ref 36.0–46.0)
HEMOGLOBIN: 7.8 g/dL — AB (ref 12.0–15.0)
MCH: 24.6 pg — ABNORMAL LOW (ref 26.0–34.0)
MCHC: 31.5 g/dL (ref 30.0–36.0)
MCV: 78.2 fL (ref 78.0–100.0)
PLATELETS: 239 10*3/uL (ref 150–400)
RBC: 3.17 MIL/uL — AB (ref 3.87–5.11)
RDW: 14.8 % (ref 11.5–15.5)
WBC: 13.4 10*3/uL — AB (ref 4.0–10.5)

## 2017-11-22 LAB — BASIC METABOLIC PANEL
Anion gap: 5 (ref 5–15)
BUN: 17 mg/dL (ref 6–20)
CO2: 25 mmol/L (ref 22–32)
Calcium: 8.6 mg/dL — ABNORMAL LOW (ref 8.9–10.3)
Chloride: 108 mmol/L (ref 101–111)
Creatinine, Ser: 0.88 mg/dL (ref 0.44–1.00)
Glucose, Bld: 98 mg/dL (ref 65–99)
POTASSIUM: 4.2 mmol/L (ref 3.5–5.1)
SODIUM: 138 mmol/L (ref 135–145)

## 2017-11-22 MED ORDER — MUPIROCIN 2 % EX OINT
1.0000 "application " | TOPICAL_OINTMENT | Freq: Two times a day (BID) | CUTANEOUS | Status: DC
  Administered 2017-11-22 – 2017-11-25 (×6): 1 via NASAL
  Filled 2017-11-22 (×2): qty 22

## 2017-11-22 MED ORDER — CHLORHEXIDINE GLUCONATE CLOTH 2 % EX PADS
6.0000 | MEDICATED_PAD | Freq: Every day | CUTANEOUS | Status: DC
  Administered 2017-11-23 – 2017-11-24 (×2): 6 via TOPICAL

## 2017-11-22 MED ORDER — LORAZEPAM 0.5 MG PO TABS: ORAL_TABLET | ORAL | 0 refills | Status: DC

## 2017-11-22 NOTE — Clinical Social Work Note (Signed)
Clinical Social Work Assessment  Patient Details  Name: Norma Ayers MRN: [id number] Date of Birth: 05/23/1951  Date of referral:  11/22/17               Reason for consult:  Facility Placement                Permission sought to share information with:  Family Supports Permission granted to share information::     Name::        Agency::  SNF  Relationship::  [name] and Occupational hygienisteggy  Contact Information:     Housing/Transportation Living arrangements for the past 2 months:  Skilled Building surveyorursing Facility Source of Information:  Adult Children Patient Interpreter Needed:  None Criminal Activity/Legal Involvement Pertinent to Current Situation/Hospitalization:  No - Comment as needed Significant Relationships:  Siblings Lives with:  Facility Resident Do you feel safe going back to the place where you live?  Yes Need for family participation in patient care:  Yes (Comment)  Care giving concerns Admitted for total left hip arthroplasty.   Social Worker assessment / plan: Patient confused. CSW talked with patient sister Norma Ayers POA/ Norma Ayers Legal Guardian to discuss patient follow up plan. Patient will discharge back to SN[facility] to continue rehab there.  Patient will transport by [facility].   Plan: SNF.  Employment status:  Disabled (Comment on whether or not currently receiving Disability) Insurance information:  [place], [place] Recommendations:  Not assessed at this time Information / Referral to community resources:  Skilled Nursing Facility  Patient/Family's Response to care:  Agreeable for surgical intervention dn patient [facility]   Patient/Family's Understanding of and Emotional Response to Diagnosis, Current Treatment, and Prognosis:  Patient sisters very involved and proactive in patient care.   Emotional Assessment Appearance:  Other (Comment Required Attitude/Demeanor/Rapport:    Affect (typically observed):    Orientation:     Alcohol / Substance use:  Not Applicable Psych involvement (Current and /or in the community):  No (Comment)  Discharge Needs  Concerns to be addressed:    Readmission within the last 30 days:  No Current discharge risk:  Dependent with Mobility, Cognitively Impaired Barriers to Discharge:  Continued Medical Work up   [employer] A Karesa Maultsby, LCSW 11/22/2017, 11:13 AM

## 2017-11-22 NOTE — Progress Notes (Addendum)
   Subjective:  Patient reports pain as mild.  No complaints from patient.  She did cause some issues on the floor last night while pulling out her IV.  She has had to move up closer to the nurses station where she has been no trouble.  She has no complaints.  She does have advanced deficits and mental cognition.  Objective:   VITALS:   Vitals:   11/21/17 1730 11/21/17 2032 11/22/17 0037 11/22/17 0634  BP: 130/65 (!) 152/67 (!) 152/83 (!) 143/87  Pulse: 73 83 80 79  Resp: 15 16 14 17   Temp: 98.7 F (37.1 C) 98.3 F (36.8 C) 98.2 F (36.8 C) 97.8 F (36.6 C)  TempSrc: Oral Oral Oral Oral  SpO2: 100% 100% 100% 100%  Weight:      Height:       Left hip Neurologically intact Sensation intact distally Intact pulses distally Dorsiflexion/Plantar flexion intact Incision: dressing C/D/I   Lab Results  Component Value Date   WBC 13.4 (H) 11/22/2017   HGB 7.8 (L) 11/22/2017   HCT 24.8 (L) 11/22/2017   MCV 78.2 11/22/2017   PLT 239 11/22/2017   BMET    Component Value Date/Time   NA 138 11/22/2017 0624   NA 141 10/03/2017   K 4.2 11/22/2017 0624   CL 108 11/22/2017 0624   CO2 25 11/22/2017 0624   GLUCOSE 98 11/22/2017 0624   BUN 17 11/22/2017 0624   BUN 9 10/03/2017   CREATININE 0.88 11/22/2017 0624   CALCIUM 8.6 (L) 11/22/2017 0624   GFRNONAA >60 11/22/2017 0624   GFRAA >60 11/22/2017 [id number]     Assessment/Plan: 1 Day Post-Op   Principal Problem:   Avascular necrosis of hip, left (HCC) Active Problems:   Osteoarthritis of left hip   Advance diet Up with therapy Weight-bear as tolerated to left lower extremity.   SCDs and apixaban For DVT prophylaxis Hemoglobin is 7.8 this morning.  This is acute blood loss anemia secondary to surgical blood loss.  We will monitor that in the morning as she is asymptomatic today. DC on Saturday to skilled nursing facility.   Yolonda KidaJason Patrick Rogers 11/22/2017, 7:28 AM   Maryan RuedJason P Rogers, MD [phone]

## 2017-11-22 NOTE — Plan of Care (Signed)
Patient struggling with mobility.  Pain and nutrition progressing well.

## 2017-11-22 NOTE — Progress Notes (Signed)
PT Cancellation Note  Patient Details Name: Norma LeydenBetty L Ayers MRN: [id number] DOB: 01/21/1951   Cancelled Treatment:     PT attempted x 2.  Pt refusing to participate with PT and becomes increasingly agitated with attempts to encourage.  Will follow.   Amiya Escamilla 11/22/2017, 2:18 PM

## 2017-11-22 NOTE — Progress Notes (Signed)
Patient is a resident of [facility] Valarie ConesShaquenia Staley is Catering managerthe director of clinical transitions at patient's facility. Please call her when patient is discharged. Patient will need to be transported via [facility] back to facility once patient is discharged. Also, please notify patient's legal guardian when patient is discharged.   Contact nunmber  Valarie ConesShaquenia Staley, LPN  office phone [phone] cell phone [phone] Card and other contact information taped to front of chart

## 2017-11-22 NOTE — Discharge Instructions (Addendum)
° °Dr. Brian Swinteck °Joint Replacement Specialist °[facility] °[address] °[place],  [address] °[phone] ° ° °TOTAL HIP REPLACEMENT POSTOPERATIVE DIRECTIONS ° ° ° °Hip Rehabilitation, Guidelines Following Surgery  ° °WEIGHT BEARING °Weight bearing as tolerated with assist device (walker, cane, etc) as directed, use it as long as suggested by your surgeon or therapist, typically at least 4-6 weeks. ° °The results of a hip operation are greatly improved after range of motion and muscle strengthening exercises. Follow all safety measures which are given to protect your hip. If any of these exercises cause increased pain or swelling in your joint, decrease the amount until you are comfortable again. Then slowly increase the exercises. Call your caregiver if you have problems or questions.  ° °HOME CARE INSTRUCTIONS  °Most of the following instructions are designed to prevent the dislocation of your new hip.  °Remove items at home which could result in a fall. This includes throw rugs or furniture in walking pathways.  °Continue medications as instructed at time of discharge. °· You may have some home medications which will be placed on hold until you complete the course of blood thinner medication. °· You may start showering once you are discharged home. Do not remove your dressing. °Do not put on socks or shoes without following the instructions of your caregivers.   °Sit on chairs with arms. Use the chair arms to help push yourself up when arising.  °Arrange for the use of a toilet seat elevator so you are not sitting low.  °· Walk with walker as instructed.  °You may resume a sexual relationship in one month or when given the OK by your caregiver.  °Use walker as long as suggested by your caregivers.  °You may put full weight on your legs and walk as much as is comfortable. °Avoid periods of inactivity such as sitting longer than an hour when not asleep. This helps prevent  blood clots.  °You may return to work once you are cleared by your surgeon.  °Do not drive a car for 6 weeks or until released by your surgeon.  °Do not drive while taking narcotics.  °Wear elastic stockings for two weeks following surgery during the day but you may remove then at night.  °Make sure you keep all of your appointments after your operation with all of your doctors and caregivers. You should call the office at the above phone number and make an appointment for approximately two weeks after the date of your surgery. °Please pick up a stool softener and laxative for home use as long as you are requiring pain medications. °· ICE to the affected hip every three hours for 30 minutes at a time and then as needed for pain and swelling. Continue to use ice on the hip for pain and swelling from surgery. You may notice swelling that will progress down to the foot and ankle.  This is normal after surgery.  Elevate the leg when you are not up walking on it.   °It is important for you to complete the blood thinner medication as prescribed by your doctor. °· Continue to use the breathing machine which will help keep your temperature down.  It is common for your temperature to cycle up and down following surgery, especially at night when you are not up moving around and exerting yourself.  The breathing machine keeps your lungs expanded and your temperature down. ° °RANGE OF MOTION AND STRENGTHENING EXERCISES  °These exercises   are designed to help you keep full movement of your hip joint. Follow your caregiver's or physical therapist's instructions. Perform all exercises about fifteen times, three times per day or as directed. Exercise both hips, even if you have had only one joint replacement. These exercises can be done on a training (exercise) mat, on the floor, on a table or on a bed. Use whatever works the best and is most comfortable for you. Use music or television while you are exercising so that the exercises  are a pleasant break in your day. This will make your life better with the exercises acting as a break in routine you can look forward to.  Lying on your back, slowly slide your foot toward your buttocks, raising your knee up off the floor. Then slowly slide your foot back down until your leg is straight again.  Lying on your back spread your legs as far apart as you can without causing discomfort.  Lying on your side, raise your upper leg and foot straight up from the floor as far as is comfortable. Slowly lower the leg and repeat.  Lying on your back, tighten up the muscle in the front of your thigh (quadriceps muscles). You can do this by keeping your leg straight and trying to raise your heel off the floor. This helps strengthen the largest muscle supporting your knee.  Lying on your back, tighten up the muscles of your buttocks both with the legs straight and with the knee bent at a comfortable angle while keeping your heel on the floor.   SKILLED REHAB INSTRUCTIONS: If the patient is transferred to a skilled rehab facility following release from the hospital, a list of the current medications will be sent to the facility for the patient to continue.  When discharged from the skilled rehab facility, please have the facility set up the patient's Home Health Physical Therapy prior to being released. Also, the skilled facility will be responsible for providing the patient with their medications at time of release from the facility to include their pain medication and their blood thinner medication. If the patient is still at the rehab facility at time of the two week follow up appointment, the skilled rehab facility will also need to assist the patient in arranging follow up appointment in our office and any transportation needs.  MAKE SURE YOU:  Understand these instructions.  Will watch your condition.  Will get help right away if you are not doing well or get worse.  Pick up stool softner and  laxative for home use following surgery while on pain medications. Do not remove your dressing. The dressing is waterproof--it is OK to take showers. Continue to use ice for pain and swelling after surgery. Do not use any lotions or creams on the incision until instructed by your surgeon. Total Hip Protocol.   Information on my medicine - ELIQUIS (apixaban) Why was Eliquis prescribed for you? Eliquis was prescribed for you to reduce the risk of blood clots forming after orthopedic surgery.    What do You need to know about Eliquis? Take your Eliquis TWICE DAILY - one tablet in the morning and one tablet in the evening with or without food.  It would be best to take the dose about the same time each day.  If you have difficulty swallowing the tablet whole please discuss with your pharmacist how to take the medication safely.  Take Eliquis exactly as prescribed by your doctor and DO NOT stop taking  Eliquis without talking to the doctor who prescribed the medication.  Stopping without other medication to take the place of Eliquis may increase your risk of developing a clot.  After discharge, you should have regular check-up appointments with your healthcare provider that is prescribing your Eliquis.  What do you do if you miss a dose? If a dose of ELIQUIS is not taken at the scheduled time, take it as soon as possible on the same day and twice-daily administration should be resumed.  The dose should not be doubled to make up for a missed dose.  Do not take more than one tablet of ELIQUIS at the same time.  Important Safety Information A possible side effect of Eliquis is bleeding. You should call your healthcare provider right away if you experience any of the following: ? Bleeding from an injury or your nose that does not stop. ? Unusual colored urine (red or dark [place]) or unusual colored stools (red or black). ? Unusual bruising for unknown reasons. ? A serious fall or if you  hit your head (even if there is no bleeding).  Some medicines may interact with Eliquis and might increase your risk of bleeding or clotting while on Eliquis. To help avoid this, consult your healthcare provider or pharmacist prior to using any new prescription or non-prescription medications, including herbals, vitamins, non-steroidal anti-inflammatory drugs (NSAIDs) and supplements.  This website has more information on Eliquis (apixaban): http://www.eliquis.com/eliquis/home  Information on my medicine - ELIQUIS (apixaban) Why was Eliquis prescribed for you? Eliquis was prescribed for you to reduce the risk of blood clots forming after orthopedic surgery.    What do You need to know about Eliquis? Take your Eliquis TWICE DAILY - one tablet in the morning and one tablet in the evening with or without food.  It would be best to take the dose about the same time each day.  If you have difficulty swallowing the tablet whole please discuss with your pharmacist how to take the medication safely.  Take Eliquis exactly as prescribed by your doctor and DO NOT stop taking Eliquis without talking to the doctor who prescribed the medication.  Stopping without other medication to take the place of Eliquis may increase your risk of developing a clot.  After discharge, you should have regular check-up appointments with your healthcare provider that is prescribing your Eliquis.  What do you do if you miss a dose? If a dose of ELIQUIS is not taken at the scheduled time, take it as soon as possible on the same day and twice-daily administration should be resumed.  The dose should not be doubled to make up for a missed dose.  Do not take more than one tablet of ELIQUIS at the same time.  Important Safety Information A possible side effect of Eliquis is bleeding. You should call your healthcare provider right away if you experience any of the following: ? Bleeding from an injury or your nose that  does not stop. ? Unusual colored urine (red or dark [place]) or unusual colored stools (red or black). ? Unusual bruising for unknown reasons. ? A serious fall or if you hit your head (even if there is no bleeding).  Some medicines may interact with Eliquis and might increase your risk of bleeding or clotting while on Eliquis. To help avoid this, consult your healthcare provider or pharmacist prior to using any new prescription or non-prescription medications, including herbals, vitamins, non-steroidal anti-inflammatory drugs (NSAIDs) and supplements.  This website has more  information on Eliquis (apixaban): http://www.eliquis.com/eliquis/home

## 2017-11-23 LAB — CBC
HEMATOCRIT: 23.9 % — AB (ref 36.0–46.0)
HEMOGLOBIN: 7.6 g/dL — AB (ref 12.0–15.0)
MCH: 24.8 pg — ABNORMAL LOW (ref 26.0–34.0)
MCHC: 31.8 g/dL (ref 30.0–36.0)
MCV: 77.9 fL — ABNORMAL LOW (ref 78.0–100.0)
Platelets: 226 10*3/uL (ref 150–400)
RBC: 3.07 MIL/uL — ABNORMAL LOW (ref 3.87–5.11)
RDW: 15.1 % (ref 11.5–15.5)
WBC: 9.1 10*3/uL (ref 4.0–10.5)

## 2017-11-23 LAB — PREPARE RBC (CROSSMATCH)

## 2017-11-23 MED ORDER — SODIUM CHLORIDE 0.9 % IV SOLN
Freq: Once | INTRAVENOUS | Status: AC
  Administered 2017-11-23: 11:00:00 via INTRAVENOUS

## 2017-11-23 NOTE — Progress Notes (Signed)
   Subjective: 2 Days Post-Op Procedure(s) (LRB): LEFT TOTAL HIP ARTHROPLASTY ANTERIOR APPROACH (Left)  Pt sleeping but able to be aroused C/o mild pain to the left hip  CBC continues to drop today Plan for SNF placement Patient reports pain as mild.  Objective:   VITALS:   Vitals:   11/22/17 2230 11/23/17 0633  BP: 101/63 120/63  Pulse: 95 94  Resp: 19 14  Temp: 97.9 F (36.6 C)   SpO2: 99% 98%    Left hip incision healing well No rashes edema or drainage nv intact distally  LABS Recent Labs    11/21/17 0738 11/22/17 0624 11/23/17 0558  HGB 9.8* 7.8* 7.6*  HCT 32.9* 24.8* 23.9*  WBC 8.6 13.4* 9.1  PLT 292 239 226    Recent Labs    11/21/17 0738 11/22/17 0624  NA 142 138  K 4.0 4.2  BUN 20 17  CREATININE 0.86 0.88  GLUCOSE 90 98     Assessment/Plan: 2 Days Post-Op Procedure(s) (LRB): LEFT TOTAL HIP ARTHROPLASTY ANTERIOR APPROACH (Left) Acute blood loss anemia - will transfuse today and plan to d/c to SNF after Continue PT/OT F/u in 2 weeks in the office Pain management Please page me this afternoon for d/c order and d/c summary    Brad Antonieta Ibaixon PA-C, MPAS [facility] now [facility]  [place] [address], [place], [place] [address] Phone: [phone] [email or web address] Facebook  [employer]  LinkedIn  Twitter

## 2017-11-23 NOTE — Progress Notes (Signed)
Pt pulled out iv at approx 1645 while receiving transfusion. Cleaned pt of blood & stool, pt combative throughout. Cleaned blood spill. IV team notified for restart, informed IVT re: pt's combativeness & obtain assistance from staff. Rhys Anchondo, [employer]

## 2017-11-23 NOTE — Plan of Care (Signed)
More mobile today.  Still has fits of anxiety and lashing out to staff.   Has been able to keep the most recent IV site for a few hours.

## 2017-11-23 NOTE — Progress Notes (Signed)
New PIV inserted. Blood transfusion re-established.

## 2017-11-23 NOTE — Evaluation (Signed)
Physical Therapy Evaluation Patient Details Name: Norma Ayers MRN: [id number] DOB: 08/02/1951 Today's Date: 11/23/2017   History of Present Illness  67 yo female s/p L THA-direct anterior;  Hx of AVN, falls, ETOH abuse, dementia with behavior disturbance, R THA in Dec 2018  Clinical Impression  Pt is s/p THA resulting in the deficits listed below (see PT Problem List). * Pt will benefit from skilled PT to increase their independence and safety with mobility to allow discharge to the venue listed below. Pt with decr sfaety awareness, incr pain and poor balance placing her at risk for falls-- will benefit from SNF level therapies post acute; will follow in acute setting     Follow Up Recommendations SNF    Equipment Recommendations  None recommended by PT    Recommendations for Other Services       Precautions / Restrictions Precautions Precautions: Fall Restrictions Weight Bearing Restrictions: No Other Position/Activity Restrictions: WBAT      Mobility  Bed Mobility Overal bed mobility: Needs Assistance Bed Mobility: Supine to Sit     Supine to sit: Min assist     General bed mobility comments: assist with RLE, multi-modal cues for participation and sequencing  Transfers Overall transfer level: Needs assistance Equipment used: Rolling walker (2 wheeled) Transfers: Sit to/from Stand Sit to Stand: Min assist         General transfer comment: assist to rise and satabilize, multi-modal cues for safety  Ambulation/Gait Ambulation/Gait assistance: Min assist;+2 safety/equipment Ambulation Distance (Feet): 14 Feet Assistive device: Rolling walker (2 wheeled) Gait Pattern/deviations: Step-to pattern;Antalgic     General Gait Details: multi-modal cues for sequence and use of UEs for better pain control  Stairs            Wheelchair Mobility    Modified Rankin (Stroke Patients Only)       Balance Overall balance assessment: Needs assistance   Sitting  balance-Leahy Scale: Fair       Standing balance-Leahy Scale: Poor Standing balance comment: reliant on UEs                             Pertinent Vitals/Pain Pain Assessment: Faces Faces Pain Scale: Hurts even more Pain Location: left hip Pain Descriptors / Indicators: Other (Comment)(cursing) Pain Intervention(s): Monitored during session;Premedicated before session    Home Living Family/patient expects to be discharged to:: Skilled nursing facility                      Prior Function Level of Independence: Needs assistance      ADL's / Homemaking Assistance Needed: Pt completes basic ADL on her own per sister. Lives at home on her own and makes simple meals. Family does grocery shopping for pt and sister sets out pills in pill box. Family checks in "pretty much every day" per sister.   Comments: Pt with history of cognitive impairments      Hand Dominance        Extremity/Trunk Assessment   Upper Extremity Assessment Upper Extremity Assessment: Defer to OT evaluation;Overall WFL for tasks assessed    Lower Extremity Assessment Lower Extremity Assessment: LLE deficits/detail LLE Deficits / Details: grossly 2+/5 to 3/5, testing limited by cognition       Communication   Communication: HOH  Cognition Arousal/Alertness: Awake/alert Behavior During Therapy: WFL for tasks assessed/performed Overall Cognitive Status: History of cognitive impairments - at baseline  General Comments      Exercises     Assessment/Plan    PT Assessment Patient needs continued PT services  PT Problem List Decreased strength;Decreased activity tolerance;Decreased balance;Decreased mobility;Decreased safety awareness;Decreased cognition;Pain       PT Treatment Interventions DME instruction;Gait training;Functional mobility training;Therapeutic activities;Therapeutic exercise;Patient/family education    PT  Goals (Current goals can be found in the Care Plan section)  Acute Rehab PT Goals Patient Stated Goal: home PT Goal Formulation: With family Time For Goal Achievement: 12/07/17 Potential to Achieve Goals: Fair    Frequency 7X/week   Barriers to discharge        Co-evaluation               AM-PAC PT "6 Clicks" Daily Activity  Outcome Measure Difficulty turning over in bed (including adjusting bedclothes, sheets and blankets)?: Unable Difficulty moving from lying on back to sitting on the side of the bed? : Unable Difficulty sitting down on and standing up from a chair with arms (e.g., wheelchair, bedside commode, etc,.)?: Unable Help needed moving to and from a bed to chair (including a wheelchair)?: A Little Help needed walking in hospital room?: A Little Help needed climbing 3-5 steps with a railing? : A Lot 6 Click Score: 11    End of Session Equipment Utilized During Treatment: Gait belt Activity Tolerance: Treatment limited secondary to agitation Patient left: in bed;with bed alarm set;with family/visitor present Nurse Communication: Mobility status PT Visit Diagnosis: Difficulty in walking, not elsewhere classified (R26.2)    Time: 1020-1036 PT Time Calculation (min) (ACUTE ONLY): 16 min   Charges:   PT Evaluation $PT Eval Low Complexity: 1 Low     PT G CodesDrucilla Chalet, PT Pager: [phone] 11/23/2017   Drucilla Chalet 11/23/2017, 2:28 PM

## 2017-11-24 LAB — TYPE AND SCREEN
ABO/RH(D): O POS
Antibody Screen: NEGATIVE
UNIT DIVISION: 0
Unit division: 0
Unit division: 0
Unit division: 0

## 2017-11-24 LAB — BPAM RBC
BLOOD PRODUCT EXPIRATION DATE: 201902172359
BLOOD PRODUCT EXPIRATION DATE: 201903062359
Blood Product Expiration Date: 201902202359
Blood Product Expiration Date: 201903062359
ISSUE DATE / TIME: 201902091052
ISSUE DATE / TIME: 201902091500
ISSUE DATE / TIME: 201902031751
ISSUE DATE / TIME: 201902091056
UNIT TYPE AND RH: 5100
Unit Type and Rh: 5100
Unit Type and Rh: 5100
Unit Type and Rh: 5100

## 2017-11-24 LAB — CBC
HEMATOCRIT: 29.2 % — AB (ref 36.0–46.0)
HEMOGLOBIN: 9.5 g/dL — AB (ref 12.0–15.0)
MCH: 25.7 pg — ABNORMAL LOW (ref 26.0–34.0)
MCHC: 32.5 g/dL (ref 30.0–36.0)
MCV: 79.1 fL (ref 78.0–100.0)
Platelets: 236 10*3/uL (ref 150–400)
RBC: 3.69 MIL/uL — ABNORMAL LOW (ref 3.87–5.11)
RDW: 14.6 % (ref 11.5–15.5)
WBC: 9.6 10*3/uL (ref 4.0–10.5)

## 2017-11-24 NOTE — NC FL2 (Signed)
[place] MEDICAID FL2 LEVEL OF CARE SCREENING TOOL     IDENTIFICATION  Patient Name: Norma Ayers Birthdate: 04/09/1951 Sex: female Admission Date (Current Location): 11/21/2017  [facility] [place]   Facility and Address:  [facility],  [address], [place] [id number]      Provider Number: [id number]  Attending Physician Name and Address:  Samson FredericSwinteck, Kalab Camps, MD  Relative Name and Phone Number:   Kandis Nabeggy Kline, sister, [phone]    Current Level of Care: Hospital Recommended Level of Care: Skilled Nursing Facility Prior Approval Number:    Date Approved/Denied:   PASRR Number: [id number]  Discharge Plan: SNF    Current Diagnoses: Patient Active Problem List   Diagnosis Date Noted  . Avascular necrosis of hip, left (HCC) 11/21/2017  . Osteoarthritis of left hip 11/21/2017  . Vascular dementia with behavior disturbance 10/16/2017  . Status post right hip replacement 10/16/2017  . Hypomagnesemia 10/15/2017  . Agitation 10/15/2017  . Depression with anxiety 10/11/2017  . Avascular necrosis of hip, right (HCC) 10/10/2017  . Right orbit fracture (HCC) 09/08/2017  . Alcoholism (HCC) 09/08/2017  . Tongue lesion 09/08/2017  . Cognitive developmental delay 09/08/2017  . Avascular necrosis of bones of both hips (HCC) 09/08/2017  . Hyponatremia 09/07/2017  . Hyperlipidemia     Orientation RESPIRATION BLADDER Height & Weight     Self  Normal Incontinent Weight: 115 lb (52.2 kg) Height:  5\' 1"  (154.9 cm)  BEHAVIORAL SYMPTOMS/MOOD NEUROLOGICAL BOWEL NUTRITION STATUS      Continent Diet(Regular)  AMBULATORY STATUS COMMUNICATION OF NEEDS Skin   Extensive Assist Verbally Surgical wounds(Inicison Left Hip)                       Personal Care Assistance Level of Assistance  Bathing, Feeding, Dressing Bathing Assistance: Limited assistance Feeding assistance: Independent Dressing Assistance: Limited assistance     Functional  Limitations Info  Sight, Hearing, Speech Sight Info: Impaired Hearing Info: Impaired Speech Info: Impaired    SPECIAL CARE FACTORS FREQUENCY  PT (By licensed PT), OT (By licensed OT)     PT Frequency: 5x/week OT Frequency: 5x/week            Contractures Contractures Info: Not present    Additional Factors Info  Code Status, Allergies Code Status Info: Fullcode Allergies Info: Allergies: No Known Allergies           Current Medications (11/24/2017):  This is the current hospital active medication list Current Facility-Administered Medications  Medication Dose Route Frequency Provider Last Rate Last Dose  . 0.9 %  sodium chloride infusion   Intravenous Continuous Samson FredericSwinteck, Mcadoo Muzquiz, MD   Stopped at 11/21/17 2236  . acetaminophen (TYLENOL) tablet 650 mg  650 mg Oral Q4H PRN Samson FredericSwinteck, Margeret Stachnik, MD   650 mg at 11/23/17 1827   Or  . acetaminophen (TYLENOL) suppository 650 mg  650 mg Rectal Q4H PRN Cherissa Hook, Arlys JohnBrian, MD      . alum & mag hydroxide-simeth (MAALOX/MYLANTA) 200-200-20 MG/5ML suspension 30 mL  30 mL Oral Q4H PRN Karver Fadden, Arlys JohnBrian, MD      . apixaban Everlene Balls(ELIQUIS) tablet 2.5 mg  2.5 mg Oral Q12H Samson FredericSwinteck, Twain Stenseth, MD   2.5 mg at 11/24/17 0839  . Chlorhexidine Gluconate Cloth 2 % PADS 6 each  6 each Topical Daily Samson FredericSwinteck, Donyale Berthold, MD   6 each at 11/23/17 1355  . dexamethasone (DECADRON) injection 10 mg  10 mg Intravenous Once Samson FredericSwinteck, [place] Frieden, MD      .  diphenhydrAMINE (BENADRYL) 12.5 MG/5ML elixir 12.5-25 mg  12.5-25 mg Oral Q4H PRN Tannis Burstein, Arlys John, MD      . docusate sodium (COLACE) capsule 100 mg  100 mg Oral BID Samson Frederic, MD   Stopped at 11/23/17 2243  . folic acid (FOLVITE) tablet 1 mg  1 mg Oral Daily Khadijatou Borak, Arlys John, MD   1 mg at 11/23/17 1042  . HYDROcodone-acetaminophen (NORCO/VICODIN) 5-325 MG per tablet 1 tablet  1 tablet Oral Q4H PRN Samson Frederic, MD   1 tablet at 11/23/17 1043  . HYDROcodone-acetaminophen (NORCO/VICODIN) 5-325 MG per tablet 2 tablet  2 tablet Oral  Q4H PRN Samson Frederic, MD   2 tablet at 11/24/17 [phone]  . HYDROmorphone (DILAUDID) injection 0.5-2 mg  0.5-2 mg Intravenous Q2H PRN Suleima Ohlendorf, Arlys John, MD      . LORazepam (ATIVAN) tablet 0.5 mg  0.5 mg Oral Daily Chistian Kasler, Arlys John, MD   0.5 mg at 11/23/17 1042  . menthol-cetylpyridinium (CEPACOL) lozenge 3 mg  1 lozenge Oral PRN Laurice Iglesia, Arlys John, MD       Or  . phenol (CHLORASEPTIC) mouth spray 1 spray  1 spray Mouth/Throat PRN Tavin Vernet, Arlys John, MD      . methocarbamol (ROBAXIN) tablet 500 mg  500 mg Oral Q6H PRN Samson Frederic, MD   500 mg at 11/24/17 1478   Or  . methocarbamol (ROBAXIN) 500 mg in dextrose 5 % 50 mL IVPB  500 mg Intravenous Q6H PRN Marshelle Bilger, Arlys John, MD      . metoCLOPramide (REGLAN) tablet 5-10 mg  5-10 mg Oral Q8H PRN Sandi Towe, Arlys John, MD       Or  . metoCLOPramide (REGLAN) injection 5-10 mg  5-10 mg Intravenous Q8H PRN Areta Terwilliger, Arlys John, MD      . mupirocin ointment (BACTROBAN) 2 % 1 application  1 application Nasal BID Samson Frederic, MD   1 application at 11/23/17 2211  . ondansetron (ZOFRAN) tablet 4 mg  4 mg Oral Q6H PRN Riggin Cuttino, Arlys John, MD       Or  . ondansetron (ZOFRAN) injection 4 mg  4 mg Intravenous Q6H PRN Cianni Manny, Arlys John, MD      . polyethylene glycol (MIRALAX / GLYCOLAX) packet 17 g  17 g Oral Daily PRN Kijana Estock, Arlys John, MD      . pravastatin (PRAVACHOL) tablet 40 mg  40 mg Oral q1800 Samson Frederic, MD   40 mg at 11/23/17 1817  . senna (SENOKOT) tablet 17.2 mg  2 tablet Oral QHS Samson Frederic, MD   Stopped at 11/23/17 2244  . sertraline (ZOLOFT) tablet 75 mg  75 mg Oral Daily Samson Frederic, MD   75 mg at 11/23/17 1042  . thiamine (VITAMIN B-1) tablet 100 mg  100 mg Oral Daily Samson Frederic, MD   100 mg at 11/23/17 1043     Discharge Medications: Please see discharge summary for a list of discharge medications.  Relevant Imaging Results:  Relevant Lab Results:   Additional Information SSN: [id number] [place], LCSW

## 2017-11-24 NOTE — Plan of Care (Signed)
Again tonight, patient has spells of extreme anxiety and rage with staff.   Often trying to get up without assistance.  Not usually easily redirected.   Pain appears to be much better.

## 2017-11-24 NOTE — Progress Notes (Signed)
Physical Therapy Treatment Patient Details Name: Norma Ayers MRN: [id number] DOB: 1951-03-08 Today's Date: 11/24/2017    History of Present Illness 67 yo female s/p L THA-direct anterior;  Hx of AVN, falls, ETOH abuse, dementia with behavior disturbance, R THA in Dec 2018    PT Comments    Pt continues to be easily agitated  And not compliant with cues for safety; pt assisted OOB with help of NT as pt is more agreeable; recommend SNF  Follow Up Recommendations  SNF;Supervision/Assistance - 24 hour     Equipment Recommendations  None recommended by PT    Recommendations for Other Services       Precautions / Restrictions Precautions Precautions: Fall Restrictions Weight Bearing Restrictions: No Other Position/Activity Restrictions: WBAT    Mobility  Bed Mobility Overal bed mobility: Needs Assistance Bed Mobility: Supine to Sit     Supine to sit: Min guard     General bed mobility comments: pt is impulsive, generally not compliant with direction from PT  Transfers Overall transfer level: Needs assistance Equipment used: Rolling walker (2 wheeled) Transfers: Sit to/from [employer] Sit to Stand: Min assist;Min guard Stand pivot transfers: Min assist       General transfer comment: assist to rise and stabilize, multi-modal cues for safety, pt is not compliant with direction from PT  and refuses to use RW for stand pivot  Ambulation/Gait                 Stairs            Wheelchair Mobility    Modified Rankin (Stroke Patients Only)       Balance             Standing balance-Leahy Scale: Poor                              Cognition Arousal/Alertness: Awake/alert Behavior During Therapy: WFL for tasks assessed/performed Overall Cognitive Status: History of cognitive impairments - at baseline(pt is very agitated)                                        Exercises      General Comments        Pertinent Vitals/Pain Pain Assessment: Faces Faces Pain Scale: Hurts even more Pain Location: left hip Pain Descriptors / Indicators: Grimacing Pain Intervention(s): Limited activity within patient's tolerance;Monitored during session;Premedicated before session    Home Living                      Prior Function            PT Goals (current goals can now be found in the care plan section) Acute Rehab PT Goals Patient Stated Goal: home PT Goal Formulation: With family Time For Goal Achievement: 12/07/17 Potential to Achieve Goals: Fair Progress towards PT goals: Progressing toward goals    Frequency    7X/week      PT Plan Current plan remains appropriate    Co-evaluation              AM-PAC PT "6 Clicks" Daily Activity  Outcome Measure  Difficulty turning over in bed (including adjusting bedclothes, sheets and blankets)?: Unable Difficulty moving from lying on back to sitting on the side of the bed? : Unable Difficulty sitting down on and  standing up from a chair with arms (e.g., wheelchair, bedside commode, etc,.)?: Unable Help needed moving to and from a bed to chair (including a wheelchair)?: A Little Help needed walking in hospital room?: A Little Help needed climbing 3-5 steps with a railing? : A Lot 6 Click Score: 11    End of Session Equipment Utilized During Treatment: Gait belt Activity Tolerance: Treatment limited secondary to agitation Patient left: in chair;with call bell/phone within reach;with chair alarm set Nurse Communication: Mobility status PT Visit Diagnosis: Difficulty in walking, not elsewhere classified (R26.2)     Time: 8295-62131230-1251 PT Time Calculation (min) (ACUTE ONLY): 21 min  Charges:  $Gait Training: 8-22 mins                    G CodesDrucilla Ayers:       Norma Ayers, PT Pager: [phone] 11/24/2017    Norma ChaletWILLIAMS,Norma Abed 11/24/2017, 2:06 PM

## 2017-11-24 NOTE — Progress Notes (Signed)
   Subjective: 3 Days Post-Op Procedure(s) (LRB): LEFT TOTAL HIP ARTHROPLASTY ANTERIOR APPROACH (Left)  Pt awake and alert this morning Mild soreness in left hip  Improved blood count today Denies any new symptoms Patient reports pain as mild.  Objective:   VITALS:   Vitals:   11/23/17 1910 11/24/17 0515  BP: (!) 156/79 (!) 149/56  Pulse: (!) 115 85  Resp: 17 14  Temp: (!) 100.4 F (38 C) 98.6 F (37 C)  SpO2: 100% 98%    Left hip incision healing well nv intact distally No rashes, edema or drainage  LABS Recent Labs    11/22/17 0624 11/23/17 0558 11/24/17 0529  HGB 7.8* 7.6* 9.5*  HCT 24.8* 23.9* 29.2*  WBC 13.4* 9.1 9.6  PLT 239 226 236    Recent Labs    11/22/17 0624  NA 138  K 4.2  BUN 17  CREATININE 0.88  GLUCOSE 98     Assessment/Plan: 3 Days Post-Op Procedure(s) (LRB): LEFT TOTAL HIP ARTHROPLASTY ANTERIOR APPROACH (Left) Acute blood loss anemia improved after transfusion Plan for SNF tomorrow Therapy as able Pain management as needed    Elizebeth KollerBrad Bobbi Kozakiewicz PA-C, MPAS [facility] is now [facility]  [place] [address], [place], [place] [address] Phone: [phone] [email or web address] Facebook  [employer]  LinkedIn  Twitter

## 2017-11-25 DIAGNOSIS — F418 Other specified anxiety disorders: Secondary | ICD-10-CM | POA: Diagnosis not present

## 2017-11-25 DIAGNOSIS — Z96642 Presence of left artificial hip joint: Secondary | ICD-10-CM | POA: Diagnosis not present

## 2017-11-25 DIAGNOSIS — F1020 Alcohol dependence, uncomplicated: Secondary | ICD-10-CM | POA: Diagnosis not present

## 2017-11-25 DIAGNOSIS — F819 Developmental disorder of scholastic skills, unspecified: Secondary | ICD-10-CM | POA: Diagnosis not present

## 2017-11-25 DIAGNOSIS — E7800 Pure hypercholesterolemia, unspecified: Secondary | ICD-10-CM | POA: Diagnosis not present

## 2017-11-25 DIAGNOSIS — Z9181 History of falling: Secondary | ICD-10-CM | POA: Diagnosis not present

## 2017-11-25 DIAGNOSIS — R2689 Other abnormalities of gait and mobility: Secondary | ICD-10-CM | POA: Diagnosis not present

## 2017-11-25 DIAGNOSIS — M160 Bilateral primary osteoarthritis of hip: Secondary | ICD-10-CM | POA: Diagnosis not present

## 2017-11-25 DIAGNOSIS — Z96643 Presence of artificial hip joint, bilateral: Secondary | ICD-10-CM | POA: Diagnosis not present

## 2017-11-25 DIAGNOSIS — F419 Anxiety disorder, unspecified: Secondary | ICD-10-CM | POA: Diagnosis present

## 2017-11-25 DIAGNOSIS — M87051 Idiopathic aseptic necrosis of right femur: Secondary | ICD-10-CM | POA: Diagnosis not present

## 2017-11-25 DIAGNOSIS — R489 Unspecified symbolic dysfunctions: Secondary | ICD-10-CM | POA: Diagnosis present

## 2017-11-25 DIAGNOSIS — M87052 Idiopathic aseptic necrosis of left femur: Secondary | ICD-10-CM | POA: Diagnosis not present

## 2017-11-25 DIAGNOSIS — F0281 Dementia in other diseases classified elsewhere with behavioral disturbance: Secondary | ICD-10-CM | POA: Diagnosis present

## 2017-11-25 DIAGNOSIS — G8911 Acute pain due to trauma: Secondary | ICD-10-CM | POA: Diagnosis not present

## 2017-11-25 DIAGNOSIS — M25552 Pain in left hip: Secondary | ICD-10-CM | POA: Diagnosis not present

## 2017-11-25 DIAGNOSIS — Z471 Aftercare following joint replacement surgery: Secondary | ICD-10-CM | POA: Diagnosis present

## 2017-11-25 DIAGNOSIS — F0151 Vascular dementia with behavioral disturbance: Secondary | ICD-10-CM | POA: Diagnosis not present

## 2017-11-25 DIAGNOSIS — S79911A Unspecified injury of right hip, initial encounter: Secondary | ICD-10-CM | POA: Diagnosis not present

## 2017-11-25 DIAGNOSIS — M6281 Muscle weakness (generalized): Secondary | ICD-10-CM | POA: Diagnosis present

## 2017-11-25 DIAGNOSIS — I10 Essential (primary) hypertension: Secondary | ICD-10-CM | POA: Diagnosis present

## 2017-11-25 DIAGNOSIS — Z96641 Presence of right artificial hip joint: Secondary | ICD-10-CM | POA: Diagnosis present

## 2017-11-25 LAB — CBC
HEMATOCRIT: 30.2 % — AB (ref 36.0–46.0)
HEMOGLOBIN: 9.7 g/dL — AB (ref 12.0–15.0)
MCH: 25.6 pg — AB (ref 26.0–34.0)
MCHC: 32.1 g/dL (ref 30.0–36.0)
MCV: 79.7 fL (ref 78.0–100.0)
Platelets: 263 10*3/uL (ref 150–400)
RBC: 3.79 MIL/uL — ABNORMAL LOW (ref 3.87–5.11)
RDW: 14.9 % (ref 11.5–15.5)
WBC: 9.4 10*3/uL (ref 4.0–10.5)

## 2017-11-25 NOTE — Progress Notes (Signed)
Physical Therapy Treatment Patient Details Name: Norma Ayers MRN: [id number] DOB: [birth year] Today's Date: 11/25/2017    History of Present Illness 67 yo female s/p L THA-direct anterior;  Hx of AVN, falls, ETOH abuse, dementia with behavior disturbance, R THA in Dec 2018    PT Comments    Pt progressing slowly, will benefit from SNF level therapies post acute to maximize independence and safety  Follow Up Recommendations  SNF;Supervision/Assistance - 24 hour     Equipment Recommendations  None recommended by PT    Recommendations for Other Services       Precautions / Restrictions Precautions Precautions: Fall Restrictions Weight Bearing Restrictions: No Other Position/Activity Restrictions: WBAT    Mobility  Bed Mobility Overal bed mobility: Needs Assistance Bed Mobility: Rolling Rolling: Min assist   Supine to sit: Min assist     General bed mobility comments: assist with LEs, cues for safety and technique  Transfers Overall transfer level: Needs assistance Equipment used: None Transfers: Sit to/from [employer] Sit to Stand: Min assist;+2 safety/equipment Stand pivot transfers: Min assist;+2 safety/equipment       General transfer comment: assist to rise and stabilize, multi-modal cues for safety, pt is not compliant with direction from PT  and refuses to use RW for stand pivot  Ambulation/Gait                 Stairs            Wheelchair Mobility    Modified Rankin (Stroke Patients Only)       Balance     Sitting balance-Leahy Scale: Fair       Standing balance-Leahy Scale: Poor Standing balance comment: reliant on UE support/HHA and support for balance                             Cognition Arousal/Alertness: Awake/alert Behavior During Therapy: Agitated Overall Cognitive Status: History of cognitive impairments - at baseline                                 General Comments: pt  is minimally cooperative with PT however able to accomplish tasks below with assistance of Nurse tech whom pt is more cooperative with      Exercises      General Comments        Pertinent Vitals/Pain Pain Assessment: Faces Faces Pain Scale: Hurts a little bit Pain Location: left hip Pain Descriptors / Indicators: Grimacing Pain Intervention(s): Monitored during session    Home Living                      Prior Function            PT Goals (current goals can now be found in the care plan section) Acute Rehab PT Goals Patient Stated Goal: home PT Goal Formulation: With family Time For Goal Achievement: 12/07/17 Potential to Achieve Goals: Fair Progress towards PT goals: Progressing toward goals    Frequency    7X/week      PT Plan Current plan remains appropriate    Co-evaluation              AM-PAC PT "6 Clicks" Daily Activity  Outcome Measure  Difficulty turning over in bed (including adjusting bedclothes, sheets and blankets)?: Unable Difficulty moving from lying on back to sitting on the side of the  bed? : Unable Difficulty sitting down on and standing up from a chair with arms (e.g., wheelchair, bedside commode, etc,.)?: Unable Help needed moving to and from a bed to chair (including a wheelchair)?: A Little Help needed walking in hospital room?: A Little Help needed climbing 3-5 steps with a railing? : A Lot 6 Click Score: 11    End of Session   Activity Tolerance: Treatment limited secondary to agitation Patient left: in chair;with call bell/phone within reach;with chair alarm set Nurse Communication: Mobility status PT Visit Diagnosis: Difficulty in walking, not elsewhere classified (R26.2)     Time: [id number] PT Time Calculation (min) (ACUTE ONLY): 18 min  Charges:  $Therapeutic Activity: 8-22 mins                    G CodesDrucilla Ayers:       Norma Ayers, PT Pager: [phone] 11/25/2017    [facility],Norma Ayers 11/25/2017, 10:56 AM

## 2017-11-25 NOTE — Progress Notes (Signed)
Rn [facility] to [facility]. Pt had her belonging including her phone and dentures in her mouth. Patients sisters were called and made aware of transfer to SNF by ptar.

## 2017-11-25 NOTE — Clinical Social Work Placement (Addendum)
D/C [place] Sent Nurse Given number for report.   CLINICAL SOCIAL WORK PLACEMENT  NOTE  Date:  11/25/2017  Patient Details  Name: Norma Ayers MRN: [id number] Date of Birth: 05/23/1951  Clinical Social Work is seeking post-discharge placement for this patient at the Skilled  Nursing Facility level of care (*CSW will initial, date and re-position this form in  chart as items are completed):      Patient/family provided with [facility] Clinical Social Work Department's list of facilities offering this level of care within the geographic area requested by the patient (or if unable, by the patient's family).      Patient/family informed of their freedom to choose among providers that offer the needed level of care, that participate in Medicare, Medicaid or managed care program needed by the patient, have an available bed and are willing to accept the patient.      Patient/family informed of [facility] ownership interest in [facility] and [facility], as well as of the fact that they are under no obligation to receive care at these facilities.  PASRR submitted to EDS on       PASRR number received on       Existing PASRR number confirmed on 11/25/17     FL2 transmitted to all facilities in geographic area requested by pt/family on     [facility]   FL2 transmitted to all facilities within larger geographic area on     [facility]   Patient informed that his/her managed care company has contracts with or will negotiate with certain facilities, including the following:        Yes   Patient/family informed of bed offers received.  [facility]   Physician recommends and patient chooses bed at     Patient to be transferred to   on 11/25/17.  Patient to be transferred to facility by  PTAR  Patient family notified on 11/25/17 of transfer.  Name of family member notified:   Sister [name]  PHYSICIAN Please prepare priority discharge  summary, including medications     Additional Comment:    _______________________________________________ Clearance CootsNicole A Konnie Noffsinger, LCSW 11/25/2017, 10:05 AM

## 2017-11-26 ENCOUNTER — Non-Acute Institutional Stay (SKILLED_NURSING_FACILITY): Payer: Medicare Other | Admitting: Adult Health

## 2017-11-26 DIAGNOSIS — F418 Other specified anxiety disorders: Secondary | ICD-10-CM | POA: Diagnosis not present

## 2017-11-26 DIAGNOSIS — E7800 Pure hypercholesterolemia, unspecified: Secondary | ICD-10-CM

## 2017-11-26 DIAGNOSIS — F819 Developmental disorder of scholastic skills, unspecified: Secondary | ICD-10-CM

## 2017-11-26 DIAGNOSIS — M87051 Idiopathic aseptic necrosis of right femur: Secondary | ICD-10-CM

## 2017-11-26 DIAGNOSIS — Z96643 Presence of artificial hip joint, bilateral: Secondary | ICD-10-CM

## 2017-11-26 DIAGNOSIS — M160 Bilateral primary osteoarthritis of hip: Secondary | ICD-10-CM

## 2017-11-26 DIAGNOSIS — F0151 Vascular dementia with behavioral disturbance: Secondary | ICD-10-CM

## 2017-11-26 DIAGNOSIS — F01518 Vascular dementia, unspecified severity, with other behavioral disturbance: Secondary | ICD-10-CM

## 2017-11-26 DIAGNOSIS — M87052 Idiopathic aseptic necrosis of left femur: Secondary | ICD-10-CM

## 2017-11-26 DIAGNOSIS — F1020 Alcohol dependence, uncomplicated: Secondary | ICD-10-CM

## 2017-11-26 NOTE — Progress Notes (Signed)
Location:   [facility] Room Number: [id number] Place of Service:  SNF (31)   CODE STATUS: full code   No Known Allergies  Chief Complaint  Patient presents with  . Hospitalization Follow-up    HPI:  She is a 67 year old long term resident who has had a planned hospitalization for a left hip replacement due to avascular necrosis.  She did have a post op hgb of 7.6 and did receive 2 units packed cells.  She will need to follow up with orthopedics in the next 2 weeks. She is unable to participate in the hpi or ros. There are no reports of uncontrolled pain; no change in appetite; no reports of behavioral disturbance. She will continue to be followed for her chronic illnesses including: hypertension; hyperlipidemia; dementia. There are no nursing concerns at this time.   Past Medical History:  Diagnosis Date  . Alcoholism (HCC) 09/08/2017  . Anemia   . Avascular necrosis of bones of both hips (HCC) 09/08/2017  . Cognitive developmental delay 09/08/2017  . Hyperlipidemia   . Hypertension   . Right orbit fracture (HCC) 09/08/2017  . Tongue lesion 09/08/2017    Past Surgical History:  Procedure Laterality Date  . gum tumor     removed  . TOTAL HIP ARTHROPLASTY Right 10/10/2017   Procedure: RIGHT TOTAL HIP ARTHROPLASTY ANTERIOR APPROACH;  Surgeon: Samson Frederic, MD;  Location: [facility];  Service: Orthopedics;  Laterality: Right;  Needs RNFA  . TOTAL HIP ARTHROPLASTY Left 11/21/2017   Procedure: LEFT TOTAL HIP ARTHROPLASTY ANTERIOR APPROACH;  Surgeon: Samson Frederic, MD;  Location: [facility];  Service: Orthopedics;  Laterality: Left;    Social History   Socioeconomic History  . Marital status: Widowed    Spouse name: Not on file  . Number of children: Not on file  . Years of education: Not on file  . Highest education level: Not on file  Social Needs  . Financial resource strain: Not on file  . Food insecurity - worry: Not on file  . Food insecurity - inability:  Not on file  . Transportation needs - medical: Not on file  . Transportation needs - non-medical: Not on file  Occupational History  . Not on file  Tobacco Use  . Smoking status: Never Smoker  . Smokeless tobacco: Never Used  Substance and Sexual Activity  . Alcohol use: Yes    Comment: 2 times per week  . Drug use: No  . Sexual activity: Not on file  Other Topics Concern  . Not on file  Social History Narrative  . Not on file   Family History  Problem Relation Age of Onset  . Emphysema Mother   . Hypertension Sister   . Heart attack Brother       VITAL SIGNS BP (!) 152/88   Pulse 76   Temp (!) 97.4 F (36.3 C)   Resp 16   Ht 5\' 1"  (1.549 m)   Wt 119 lb 9.6 oz (54.3 kg)   SpO2 98%   BMI 22.60 kg/m    Outpatient Encounter Medications as of 11/26/2017  Medication Sig  . apixaban (ELIQUIS) 2.5 MG TABS tablet Take 1 tablet (2.5 mg total) by mouth 2 (two) times daily.  Marland Kitchen docusate sodium (COLACE) 100 MG capsule Take 1 capsule (100 mg total) by mouth 2 (two) times daily.  . folic acid (FOLVITE) 1 MG tablet Take 1 tablet (1 mg total) by mouth daily.  Marland Kitchen LORazepam (  ATIVAN) 0.5 MG tablet Give 1 tablet by mouth every morning  . lovastatin (MEVACOR) 40 MG tablet Take 40 mg by mouth daily.  . naproxen sodium (ALEVE) 220 MG tablet Take 440 mg by mouth 2 (two) times daily.   . NON FORMULARY 2 (two) times daily. House Supplement  . ondansetron (ZOFRAN) 4 MG tablet Take 1 tablet (4 mg total) by mouth every 6 (six) hours as needed for nausea.  Marland Kitchen. senna (SENOKOT) 8.6 MG TABS tablet Take 2 tablets (17.2 mg total) by mouth at bedtime.  . sertraline (ZOLOFT) 50 MG tablet Take 75 mg by mouth daily.  Marland Kitchen. thiamine 100 MG tablet Take 1 tablet (100 mg total) by mouth daily.  Marland Kitchen. HYDROcodone-acetaminophen (NORCO) 5-325 MG tablet Take 1-2 tablets by mouth every 4 (four) hours as needed for moderate pain.   No facility-administered encounter medications on file as of 11/26/2017.      SIGNIFICANT  DIAGNOSTIC EXAMS   PREVIOUS:   09-07-17: chest x-ray: No active disease.   09-07-17: ct of head cervical spine; maxillofacial:  1. Acute right orbital floor and medial orbital wall fractures with fracture involving the canal for the right maxillary nerve seen along the medial orbital floor. There is 3 mm of caudal displacement of the orbital floor fracture with herniation of periorbital fat and minimal herniation of the right maxillary nerve. 2. Associated with the right orbital fractures are a small amount of medial extraconal hemorrhage/fluid as well as small amount of lateral intraconal hemorrhage. Hyperdense fluid in the right maxillary sinus would be in keeping with hemorrhage/blood products. Right periorbital soft tissue swelling is noted. 3. No acute intracranial abnormality. 4. Mild cervical spondylosis without acute cervical spine fracture.  09-07-17: right hip x-ray:  1. No acute abnormality of the bony pelvis or the right hip. 2. 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  09-07-17: ct of pelvis:  1. No acute fracture of the pelvis or hips. 2. Bilateral hip AVN and advanced osteoarthritis, findings have progressed in the right hip from CT 6 months prior.  10-10-17: pelvic x-ray: Prosthetic components of right total hip replacement well-seated. Loss of bone inferior to the acetabular portion the prosthesis is unchanged. Advanced avascular necrosis left femoral head with remodeling and foci of calcification within the left hip joint. No acute fracture or dislocation.  TODAY:   11-21-17: left hip x-ray:  Left hip prosthesis in good position without complicating features.      LABS REVIEWED: PREVOUS:   09-07-17: wbc 7.5; hgb 10.8; hct 30.4; mcv 76.2; plt 298; glucose 105; bun 21; creat 1.18; k+ 3.2;  na++ 116; ca 8.9 ck 437; tsh 1.378 09-09-17: glucose 98; bun 9; creat 0.82; k+ 4.1; na++ 115; ca 8.0; mag 1.5; phos 1.9; uric 4.2 09-10-17: wbc 7.7; hgb 9.6; hct 26.6; mcv 75.6 plt 260; glucose 121; bun <5; creat 0.69; k+ 3.4; na++ 112; ca 8.0 09-11-17: wbc 7.1; hgb 9.1; hct 25.8; mcv 75.9; plt 277; glucose 88; bun <5; creat 0.67; k+ 3.5; na++ 118; ca 8.981mag 1.2; phos 3.5  09-11-17: (repeat) glucose 120; bun 6; creat 0.83; k+ 3.8; na++ 130; ca 8.1 09-12-17: wbc 6.9; hgb 8.9; hct 25.2; mcv 78.5; plt 275; glucose 104; bun 6; creat 0.87; k+ 4.1; na++ 130; ca 8.6 10-03-17: glucose 94; bun 9.1; creat 0.77; k+ 4.5; na++ 141; ca 9.4; liver normal albumin 3.6;  mag 1.6 ck 42;  10-10-17: wbc 7.0; hgb 10.9; hct 34.7; mcv 84.0; plt 360 mag 1.6  10-11-17: glucose 116; bun 12; creat 0.81 ;k+ 4.0; na++ 138; ca 8.2 10-13-17: wbc 7.9; hgb 7.9; hct 24.9; mcv 83.3; plt 159   TODAY:   11-21-17: wbc 8.6; hgb 9.8; hct 32.9; mcv 79.7; plt 292; glucose 90; bun 20; creat 0.86; k+ 4.2; na++ 140; ca 9.4 11-24-17: wbc 9.6; hgb 9.5; hct 29.2; mcv 79.1 plt 236 11-25-17: wbc 9.4; hgb 9.7; hct 30.2; mcv 79.7; plt 263       Review of Systems  Unable to perform ROS: Dementia (confused )    Physical Exam  Constitutional: No distress.  Frail   Neck: No thyromegaly present.  Cardiovascular: Normal rate, regular rhythm and intact distal pulses.  Murmur heard. 1/6  Pulmonary/Chest: Effort normal and breath sounds normal. No respiratory distress.  Abdominal: Soft. Bowel sounds are normal. She exhibits no distension. There is no tenderness.  Musculoskeletal: She exhibits no edema.  Is able to move all extremities History of right hip replacement New left hip replacement   Lymphadenopathy:    She has no cervical adenopathy.  Neurological: She is alert.  Skin: Skin is warm and dry. She is not diaphoretic.  Left hip incision without signs of infection present.   Psychiatric: She has a normal mood and affect.      ASSESSMENT/ PLAN:  TODAY:   1. Acute blood loss anemia: hgb 9.7; is stable did receive 2 units packed cells in the hospital will monitor   2. Constipation; slow transit: stable will continue colace twice daily and senna 2 tabs nightly   3. Alcoholism: is stable will continue thiamine and folic acid daily   4. Hyperlipidemia: stable will continue mevacor 40 mg daily   5. Osteoarthritis: bilateral hips: is status post bilateral hip replacements: pain is stable will continue aleve 440 mg twice daily   6. Avascular necrosis bilateral hips: stable is status post new left hip replacement: will continue therapy as directed and will follow up with orthopedics. Will continue eliquis 2.5 mg twice daily for 30 days will continue vicodin 5/325 mg 1 or 2 tabs every 4 hours as needed  7. Depression with anxiety: is stable is taking zoloft 75 mg daily and takes ativan 0.5 mg daily for anxiety   8. Cognitive developmental delay: has vascular dementia with behavioral disturbance: is without change her current weight is 119 pounds will monitor her status.   On 12-03-17: check cbc; lipids; mag   MD is aware of resident's narcotic use and is in agreement with current plan of care. We will attempt to wean resident as apropriate   Synthia Innocent NP [facility]  Contact [phone] Monday through Friday 8am- 5pm  After hours call [phone]

## 2017-11-27 ENCOUNTER — Telehealth: Payer: Self-pay

## 2017-11-27 NOTE — Telephone Encounter (Signed)
Possible re-admission to facility. [facility] Hospital F/U is needed if patient was re-admitted to facility upon discharge. [facility]

## 2017-11-29 DIAGNOSIS — Z9181 History of falling: Secondary | ICD-10-CM | POA: Diagnosis not present

## 2017-11-29 DIAGNOSIS — R2689 Other abnormalities of gait and mobility: Secondary | ICD-10-CM | POA: Diagnosis not present

## 2017-11-29 DIAGNOSIS — M25552 Pain in left hip: Secondary | ICD-10-CM | POA: Diagnosis not present

## 2017-12-02 DIAGNOSIS — R2689 Other abnormalities of gait and mobility: Secondary | ICD-10-CM | POA: Diagnosis not present

## 2017-12-02 DIAGNOSIS — M25552 Pain in left hip: Secondary | ICD-10-CM | POA: Diagnosis not present

## 2017-12-02 DIAGNOSIS — Z9181 History of falling: Secondary | ICD-10-CM | POA: Diagnosis not present

## 2017-12-03 LAB — LIPID PANEL
CHOLESTEROL: 219 — AB (ref 0–200)
HDL: 41 (ref 35–70)
LDL Cholesterol: 152
Triglycerides: 129 (ref 40–160)

## 2017-12-03 LAB — CBC AND DIFFERENTIAL
HCT: 29 — AB (ref 36–46)
Hemoglobin: 9.8 — AB (ref 12.0–16.0)
PLATELETS: 429 — AB (ref 150–399)
WBC: 6.9

## 2017-12-04 DIAGNOSIS — Z471 Aftercare following joint replacement surgery: Secondary | ICD-10-CM | POA: Diagnosis not present

## 2017-12-04 DIAGNOSIS — Z96642 Presence of left artificial hip joint: Secondary | ICD-10-CM | POA: Diagnosis not present

## 2017-12-06 DIAGNOSIS — R2689 Other abnormalities of gait and mobility: Secondary | ICD-10-CM | POA: Diagnosis not present

## 2017-12-06 DIAGNOSIS — M25552 Pain in left hip: Secondary | ICD-10-CM | POA: Diagnosis not present

## 2017-12-06 DIAGNOSIS — Z9181 History of falling: Secondary | ICD-10-CM | POA: Diagnosis not present

## 2017-12-12 DIAGNOSIS — R262 Difficulty in walking, not elsewhere classified: Secondary | ICD-10-CM | POA: Diagnosis not present

## 2017-12-12 DIAGNOSIS — B351 Tinea unguium: Secondary | ICD-10-CM | POA: Diagnosis not present

## 2017-12-12 DIAGNOSIS — I739 Peripheral vascular disease, unspecified: Secondary | ICD-10-CM | POA: Diagnosis not present

## 2017-12-16 ENCOUNTER — Encounter: Payer: Self-pay | Admitting: Internal Medicine

## 2017-12-25 ENCOUNTER — Non-Acute Institutional Stay (SKILLED_NURSING_FACILITY): Payer: Medicare Other | Admitting: Adult Health

## 2017-12-25 ENCOUNTER — Encounter: Payer: Self-pay | Admitting: Adult Health

## 2017-12-25 DIAGNOSIS — F1020 Alcohol dependence, uncomplicated: Secondary | ICD-10-CM | POA: Diagnosis not present

## 2017-12-25 DIAGNOSIS — E7800 Pure hypercholesterolemia, unspecified: Secondary | ICD-10-CM

## 2017-12-25 DIAGNOSIS — K5901 Slow transit constipation: Secondary | ICD-10-CM

## 2017-12-25 DIAGNOSIS — D649 Anemia, unspecified: Secondary | ICD-10-CM | POA: Diagnosis not present

## 2017-12-25 DIAGNOSIS — Z96643 Presence of artificial hip joint, bilateral: Secondary | ICD-10-CM | POA: Diagnosis not present

## 2017-12-25 NOTE — Progress Notes (Signed)
Location:   [facility] Room Number: 113 B Place of Service:  SNF (31)   CODE STATUS: Full Code (Most form updated 11/04/17)  No Known Allergies  Chief Complaint  Patient presents with  . Medical Management of Chronic Issues    Alcoholism hyperlipidemia; constipation; anemia     HPI:  She is a 67 year old long term resident of this facility being seen for the management of her chronic illnesses; alcoholism; hyperlipidemia; constipation; anemia. She is unable to participate in the hpi or ros. There are no reports of constipation; chest pain; or pallor. She will ask staff to get her alcohol. There are no nursing concerns at this time.   Past Medical History:  Diagnosis Date  . Alcoholism (HCC) 09/08/2017  . Anemia   . Avascular necrosis of bones of both hips (HCC) 09/08/2017  . Cognitive developmental delay 09/08/2017  . Hyperlipidemia   . Hypertension   . Right orbit fracture (HCC) 09/08/2017  . Tongue lesion 09/08/2017    Past Surgical History:  Procedure Laterality Date  . gum tumor     removed  . TOTAL HIP ARTHROPLASTY Right 10/10/2017   Procedure: RIGHT TOTAL HIP ARTHROPLASTY ANTERIOR APPROACH;  Surgeon: Samson Frederic, MD;  Location: [facility];  Service: Orthopedics;  Laterality: Right;  Needs RNFA  . TOTAL HIP ARTHROPLASTY Left 11/21/2017   Procedure: LEFT TOTAL HIP ARTHROPLASTY ANTERIOR APPROACH;  Surgeon: Samson Frederic, MD;  Location: [facility];  Service: Orthopedics;  Laterality: Left;    Social History   Socioeconomic History  . Marital status: Widowed    Spouse name: Not on file  . Number of children: Not on file  . Years of education: Not on file  . Highest education level: Not on file  Social Needs  . Financial resource strain: Not on file  . Food insecurity - worry: Not on file  . Food insecurity - inability: Not on file  . Transportation needs - medical: Not on file  . Transportation needs - non-medical: Not on file  Occupational  History  . Not on file  Tobacco Use  . Smoking status: Never Smoker  . Smokeless tobacco: Never Used  Substance and Sexual Activity  . Alcohol use: Yes    Comment: 2 times per week  . Drug use: No  . Sexual activity: Not on file  Other Topics Concern  . Not on file  Social History Narrative  . Not on file   Family History  Problem Relation Age of Onset  . Emphysema Mother   . Hypertension Sister   . Heart attack Brother       VITAL SIGNS BP 128/66   Pulse 68   Temp (!) 96.3 F (35.7 C)   Resp 16   Ht 5\' 1"  (1.549 m)   Wt 115 lb 3.2 oz (52.3 kg)   SpO2 98%   BMI 21.77 kg/m   Outpatient Encounter Medications as of 12/25/2017  Medication Sig  . atorvastatin (LIPITOR) 20 MG tablet Take 20 mg by mouth every evening.  . docusate sodium (COLACE) 100 MG capsule Take 1 capsule (100 mg total) by mouth 2 (two) times daily.  Marland Kitchen ENSURE (ENSURE) Give 60cc by mouth two times daily for nutritional supplement  . folic acid (FOLVITE) 1 MG tablet Take 1 tablet (1 mg total) by mouth daily.  Marland Kitchen HYDROcodone-acetaminophen (NORCO/VICODIN) 5-325 MG tablet Take 1 tablet by mouth every 12 (twelve) hours as needed for moderate pain (Per Dr. Veda Canning).  Marland Kitchen  LORazepam (ATIVAN) 0.5 MG tablet Give 1 tablet by mouth every morning  . naproxen sodium (ALEVE) 220 MG tablet Take 440 mg by mouth 2 (two) times daily.   . Nutritional Supplements (NUTRITIONAL SUPPLEMENT PO) NAS (No salt added) diet - Regular texture, regular consistency, heart healthy  . ondansetron (ZOFRAN) 4 MG tablet Take 1 tablet (4 mg total) by mouth every 6 (six) hours as needed for nausea.  Marland Kitchen senna (SENOKOT) 8.6 MG TABS tablet Take 2 tablets (17.2 mg total) by mouth at bedtime.  . sertraline (ZOLOFT) 50 MG tablet Take 75 mg by mouth daily.  Marland Kitchen thiamine 100 MG tablet Take 1 tablet (100 mg total) by mouth daily.  . [DISCONTINUED] apixaban (ELIQUIS) 2.5 MG TABS tablet Take 1 tablet (2.5 mg total) by mouth 2 (two) times daily. (Patient not  taking: Reported on 12/25/2017)  . [DISCONTINUED] HYDROcodone-acetaminophen (NORCO) 5-325 MG tablet Take 1-2 tablets by mouth every 4 (four) hours as needed for moderate pain. (Patient not taking: Reported on 12/25/2017)  . [DISCONTINUED] lovastatin (MEVACOR) 40 MG tablet Take 40 mg by mouth daily.  . [DISCONTINUED] NON FORMULARY 2 (two) times daily. House Supplement   No facility-administered encounter medications on file as of 12/25/2017.      SIGNIFICANT DIAGNOSTIC EXAMS  PREVIOUS:   09-07-17: chest x-ray: No active disease.   09-07-17: ct of head cervical spine; maxillofacial:  1. Acute right orbital floor and medial orbital wall fractures with fracture involving the canal for the right maxillary nerve seen along the medial orbital floor. There is 3 mm of caudal displacement of the orbital floor fracture with herniation of periorbital fat and minimal herniation of the right maxillary nerve. 2. Associated with the right orbital fractures are a small amount of medial extraconal hemorrhage/fluid as well as small amount of lateral intraconal hemorrhage. Hyperdense fluid in the right maxillary sinus would be in keeping with hemorrhage/blood products. Right periorbital soft tissue swelling is noted. 3. No acute intracranial abnormality. 4. Mild cervical spondylosis without acute cervical spine fracture.  09-07-17: right hip x-ray:  1. No acute abnormality of the bony pelvis or the right hip. 2. 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  09-07-17: ct of pelvis:  1. No acute fracture of the pelvis or hips. 2. Bilateral hip AVN and advanced osteoarthritis, findings have progressed in the right hip from CT 6 months prior.  10-10-17: pelvic x-ray: Prosthetic components of right total hip replacement well-seated. Loss  of bone inferior to the acetabular portion the prosthesis is unchanged. Advanced avascular necrosis left femoral head with remodeling and foci of calcification within the left hip joint. No acute fracture or dislocation.  11-21-17: left hip x-ray:  Left hip prosthesis in good position without complicating features.   NO NEW EXAMS      LABS REVIEWED: PREVOUS:   09-07-17: wbc 7.5; hgb 10.8; hct 30.4; mcv 76.2; plt 298; glucose 105; bun 21; creat 1.18; k+ 3.2; na++ 116; ca 8.9 ck 437; tsh 1.378 09-09-17: glucose 98; bun 9; creat 0.82; k+ 4.1; na++ 115; ca 8.0; mag 1.5; phos 1.9; uric 4.2 09-10-17: wbc 7.7; hgb 9.6; hct 26.6; mcv 75.6 plt 260; glucose 121; bun <5; creat 0.69; k+ 3.4; na++ 112; ca 8.0 09-11-17: wbc 7.1; hgb 9.1; hct 25.8; mcv 75.9; plt 277; glucose 88; bun <5; creat 0.67; k+ 3.5; na++ 118; ca 8.55mag 1.2;  phos 3.5  09-11-17: (repeat) glucose 120; bun 6; creat 0.83; k+ 3.8; na++ 130; ca 8.1 09-12-17: wbc 6.9; hgb 8.9; hct 25.2; mcv 78.5; plt 275; glucose 104; bun 6; creat 0.87; k+ 4.1; na++ 130; ca 8.6 10-03-17: glucose 94; bun 9.1; creat 0.77; k+ 4.5; na++ 141; ca 9.4; liver normal albumin 3.6; mag 1.6 ck 42;  10-10-17: wbc 7.0; hgb 10.9; hct 34.7; mcv 84.0; plt 360 mag 1.6  10-11-17: glucose 116; bun 12; creat 0.81 ;k+ 4.0; na++ 138; ca 8.2 10-13-17: wbc 7.9; hgb 7.9; hct 24.9; mcv 83.3; plt 159  11-21-17: wbc 8.6; hgb 9.8; hct 32.9; mcv 79.7; plt 292; glucose 90; bun 20; creat 0.86; k+ 4.2; na++ 140; ca 9.4 11-24-17: wbc 9.6; hgb 9.5; hct 29.2; mcv 79.1 plt 236 11-25-17: wbc 9.4; hgb 9.7; hct 30.2; mcv 79.7; plt 263   NO NEW LABS       Review of Systems  Unable to perform ROS: Dementia (confusion )    Physical Exam  Constitutional: No distress.  Frail   Neck: No thyromegaly present.  Cardiovascular: Normal rate, regular rhythm and intact distal pulses.  Murmur heard. 1/6  Pulmonary/Chest: Effort normal and breath sounds normal. No respiratory distress.  Abdominal:  Soft. Bowel sounds are normal. There is no tenderness.  Musculoskeletal: Normal range of motion. She exhibits no edema.  Is ambulatory Is status post bilateral hip replacements   Lymphadenopathy:    She has no cervical adenopathy.  Neurological: She is alert.  Skin: Skin is warm and dry. She is not diaphoretic.  Psychiatric: She has a normal mood and affect.   ASSESSMENT/ PLAN:  TODAY:   1. Chronic anemia : hgb 9.7; is stable did receive 2 units packed cells in the hospital in Feb 2019  will monitor   2. Constipation; slow transit: stable will continue colace twice daily and senna 2 tabs nightly   3. Alcoholism: is stable will continue thiamine and folic acid daily   4. Pure hypercholesterolemia: stable will continue lipitor 20 mg daily  PREVIOUS   5. Osteoarthritis: bilateral hips: is status post bilateral hip replacements: pain is stable will continue aleve 440 mg twice daily   6. Avascular necrosis bilateral hips: stable is status post bilateral hip replacements; will continue aleve 440 mg twice daily and will continue vicodin 5/325 mg twice daily as needed   7. Depression with anxiety: is stable is taking zoloft 75 mg daily and takes ativan 0.5 mg daily for anxiety   8. Cognitive developmental delay: has vascular dementia with behavioral disturbance: is without change her current weight is 115 pounds will monitor her status.      MD is aware of resident's narcotic use and is in agreement with current plan of care. We will attempt to wean resident as apropriate    Synthia Innocenteborah Green NP [facility]  Contact [phone] Monday through Friday 8am- 5pm  After hours call [phone]

## 2017-12-26 ENCOUNTER — Non-Acute Institutional Stay (SKILLED_NURSING_FACILITY): Payer: Medicare Other | Admitting: Adult Health

## 2017-12-26 ENCOUNTER — Encounter: Payer: Self-pay | Admitting: Adult Health

## 2017-12-26 DIAGNOSIS — F0151 Vascular dementia with behavioral disturbance: Secondary | ICD-10-CM | POA: Diagnosis not present

## 2017-12-26 DIAGNOSIS — F1020 Alcohol dependence, uncomplicated: Secondary | ICD-10-CM | POA: Diagnosis not present

## 2017-12-26 DIAGNOSIS — M160 Bilateral primary osteoarthritis of hip: Secondary | ICD-10-CM | POA: Diagnosis not present

## 2017-12-26 DIAGNOSIS — D649 Anemia, unspecified: Secondary | ICD-10-CM | POA: Insufficient documentation

## 2017-12-26 DIAGNOSIS — F01518 Vascular dementia, unspecified severity, with other behavioral disturbance: Secondary | ICD-10-CM

## 2017-12-26 DIAGNOSIS — K5901 Slow transit constipation: Secondary | ICD-10-CM | POA: Insufficient documentation

## 2017-12-26 NOTE — Progress Notes (Signed)
Location:   [facility] Room Number: 113 B Place of Service:  SNF (31)   CODE STATUS: Full Code (Most Form Updated 11/12/17)  No Known Allergies  Chief Complaint  Patient presents with  . Acute Visit    Care Plan Meeting    HPI:  We have come together for her care plan meeting. She is unable to participate in the care plan meeting her daughter (RP) is present for the meeting. She is doing well; is ambulatory and is not using her walker most of the time. Therapy is continuing to work with her. Her daughter would like to attempt to remove her from using briefs; therapy will work her to use the bathroom. Her daughter would like for her to return back home. We did discuss that she will need 24 hour supervision. We did discuss that she will be better served in assisted living memory care unit. She is too independent to remain in skilled nursing. Her daughter has verbalized understanding. We did discuss that assisted living could be used as a stepping stone to her becoming more independent.  We did discuss her advanced directives; her daughter did verbalize understanding and does not want to make changes at this time. At this time her MOST form reads for every thing to be done.  She is unable to participate in the hpi or ros. There are no reports of changes in appetite; she does continue to ask to be taken to store for liquor; there are no reports of pain present.  She will continue to be monitored for her chronic illnesses including: osteoarthritis; anemia; dementia.   Past Medical History:  Diagnosis Date  . Alcoholism (HCC) 09/08/2017  . Anemia   . Avascular necrosis of bones of both hips (HCC) 09/08/2017  . Cognitive developmental delay 09/08/2017  . Hyperlipidemia   . Hypertension   . Right orbit fracture (HCC) 09/08/2017  . Tongue lesion 09/08/2017    Past Surgical History:  Procedure Laterality Date  . gum tumor     removed  . TOTAL HIP ARTHROPLASTY Right  10/10/2017   Procedure: RIGHT TOTAL HIP ARTHROPLASTY ANTERIOR APPROACH;  Surgeon: Samson Frederic, MD;  Location: [facility];  Service: Orthopedics;  Laterality: Right;  Needs RNFA  . TOTAL HIP ARTHROPLASTY Left 11/21/2017   Procedure: LEFT TOTAL HIP ARTHROPLASTY ANTERIOR APPROACH;  Surgeon: Samson Frederic, MD;  Location: [facility];  Service: Orthopedics;  Laterality: Left;    Social History   Socioeconomic History  . Marital status: Widowed    Spouse name: Not on file  . Number of children: Not on file  . Years of education: Not on file  . Highest education level: Not on file  Social Needs  . Financial resource strain: Not on file  . Food insecurity - worry: Not on file  . Food insecurity - inability: Not on file  . Transportation needs - medical: Not on file  . Transportation needs - non-medical: Not on file  Occupational History  . Not on file  Tobacco Use  . Smoking status: Never Smoker  . Smokeless tobacco: Never Used  Substance and Sexual Activity  . Alcohol use: Yes    Comment: 2 times per week  . Drug use: No  . Sexual activity: Not on file  Other Topics Concern  . Not on file  Social History Narrative  . Not on file   Family History  Problem Relation Age of Onset  . Emphysema Mother   . Hypertension Sister   .  Heart attack Brother       VITAL SIGNS BP 122/68   Pulse 68   Ht 5\' 1"  (1.549 m)   Wt 115 lb 3.2 oz (52.3 kg)   SpO2 98%   BMI 21.77 kg/m   Outpatient Encounter Medications as of 12/26/2017  Medication Sig  . atorvastatin (LIPITOR) 20 MG tablet Take 20 mg by mouth every evening.  . docusate sodium (COLACE) 100 MG capsule Take 1 capsule (100 mg total) by mouth 2 (two) times daily.  Marland Kitchen. ENSURE (ENSURE) Give 60cc by mouth two times daily for nutritional supplement  . folic acid (FOLVITE) 1 MG tablet Take 1 tablet (1 mg total) by mouth daily.  Marland Kitchen. HYDROcodone-acetaminophen (NORCO/VICODIN) 5-325 MG tablet Take 1 tablet by mouth every 12 (twelve) hours as needed  for moderate pain (Per Dr. Veda CanningSwintek).  . LORazepam (ATIVAN) 0.5 MG tablet Give 1 tablet by mouth every morning  . naproxen sodium (ALEVE) 220 MG tablet Take 440 mg by mouth 2 (two) times daily.   . Nutritional Supplements (NUTRITIONAL SUPPLEMENT PO) NAS (No salt added) diet - Regular texture, regular consistency, heart healthy  . ondansetron (ZOFRAN) 4 MG tablet Take 1 tablet (4 mg total) by mouth every 6 (six) hours as needed for nausea.  Marland Kitchen. senna (SENOKOT) 8.6 MG TABS tablet Take 2 tablets (17.2 mg total) by mouth at bedtime.  . sertraline (ZOLOFT) 50 MG tablet Take 75 mg by mouth daily.  Marland Kitchen. thiamine 100 MG tablet Take 1 tablet (100 mg total) by mouth daily.   No facility-administered encounter medications on file as of 12/26/2017.      SIGNIFICANT DIAGNOSTIC EXAMS   PREVIOUS:   09-07-17: chest x-ray: No active disease.   09-07-17: ct of head cervical spine; maxillofacial:  1. Acute right orbital floor and medial orbital wall fractures with fracture involving the canal for the right maxillary nerve seen along the medial orbital floor. There is 3 mm of caudal displacement of the orbital floor fracture with herniation of periorbital fat and minimal herniation of the right maxillary nerve. 2. Associated with the right orbital fractures are a small amount of medial extraconal hemorrhage/fluid as well as small amount of lateral intraconal hemorrhage. Hyperdense fluid in the right maxillary sinus would be in keeping with hemorrhage/blood products. Right periorbital soft tissue swelling is noted. 3. No acute intracranial abnormality. 4. Mild cervical spondylosis without acute cervical spine fracture.  09-07-17: right hip x-ray:  1. No acute abnormality of the bony pelvis or the right hip. 2. Advanced degenerative changes in the hip joints bilaterally slightly progressive compared to the prior examination related to severe bilateral femoral head avascular necrosis and advanced bilateral hip joint  osteoarthritis.  09-07-17: ct of pelvis:  1. No acute fracture of the pelvis or hips. 2. Bilateral hip AVN and advanced osteoarthritis, findings have progressed in the right hip from CT 6 months prior.  10-10-17: pelvic x-ray: Prosthetic components of right total hip replacement well-seated. Loss of bone inferior to the acetabular portion the prosthesis is unchanged. Advanced avascular necrosis left femoral head with remodeling and foci of calcification within the left hip joint. No acute fracture or dislocation.  11-21-17: left hip x-ray:  Left hip prosthesis in good position without complicating features.   NO NEW EXAMS      LABS REVIEWED: PREVOUS:   09-07-17: wbc 7.5; hgb 10.8; hct 30.4; mcv 76.2; plt 298; glucose 105; bun 21; creat 1.18; k+ 3.2; na++ 116; ca 8.9 ck 437; tsh 1.378 09-09-17:  glucose 98; bun 9; creat 0.82; k+ 4.1; na++ 115; ca 8.0; mag 1.5; phos 1.9; uric 4.2 09-10-17: wbc 7.7; hgb 9.6; hct 26.6; mcv 75.6 plt 260; glucose 121; bun <5; creat 0.69; k+ 3.4; na++ 112; ca 8.0 09-11-17: wbc 7.1; hgb 9.1; hct 25.8; mcv 75.9; plt 277; glucose 88; bun <5; creat 0.67; k+ 3.5; na++ 118; ca 8.5mag 1.2; phos 3.5  09-11-17: (repeat) glucose 120; bun 6; creat 0.83; k+ 3.8; na++ 130; ca 8.1 09-12-17: wbc 6.9; hgb 8.9; hct 25.2; mcv 78.5; plt 275; glucose 104; bun 6; creat 0.87; k+ 4.1; na++ 130; ca 8.6 10-03-17: glucose 94; bun 9.1; creat 0.77; k+ 4.5; na++ 141; ca 9.4; liver normal albumin 3.6; mag 1.6 ck 42;  10-10-17: wbc 7.0; hgb 10.9; hct 34.7; mcv 84.0; plt 360 mag 1.6  10-11-17: glucose 116; bun 12; creat 0.81 ;k+ 4.0; na++ 138; ca 8.2 10-13-17: wbc 7.9; hgb 7.9; hct 24.9; mcv 83.3; plt 159  11-21-17: wbc 8.6; hgb 9.8; hct 32.9; mcv 79.7; plt 292; glucose 90; bun 20; creat 0.86; k+ 4.2; na++ 140; ca 9.4 11-24-17: wbc 9.6; hgb 9.5; hct 29.2; mcv 79.1 plt 236 11-25-17: wbc 9.4; hgb 9.7; hct 30.2; mcv 79.7; plt 263   NO NEW LABS       Review of Systems  Unable to perform ROS:  Dementia (confusion )    Physical Exam  Constitutional: No distress.  Frail   Neck: No thyromegaly present.  Cardiovascular: Normal rate, regular rhythm and intact distal pulses.  Murmur heard. 1/6  Pulmonary/Chest: Effort normal and breath sounds normal. No respiratory distress.  Abdominal: Soft. Bowel sounds are normal. She exhibits no distension. There is no tenderness.  Musculoskeletal: She exhibits no edema.  Is ambulatory Is status post bilateral hip replacements    Lymphadenopathy:    She has no cervical adenopathy.  Neurological: She is alert.  Skin: Skin is warm and dry. She is not diaphoretic.  Psychiatric: She has a normal mood and affect.     ASSESSMENT/ PLAN:  TODAY:   1.  Alcoholism: 2. Osteoarthritis: bilateral hips: is status post bilateral hip replacements:  3. Cognitive developmental delay: has vascular dementia with behavioral disturbance:   Will continue her current plan of care Will not make changes in her medication regimen  Time spent with patient and family: 45 minutes: 20 minutes spent with advanced directives: discussed her discharge potential, goals; her goals of therapy; her medications; and medical status. Her MOST form has been reviewed: no changes. Her family has verbalized understanding.      MD is aware of resident's narcotic use and is in agreement with current plan of care. We will attempt to wean resident as apropriate   Synthia Innocent NP [facility]  Contact [phone] Monday through Friday 8am- 5pm  After hours call [phone]

## 2018-01-01 DIAGNOSIS — Z471 Aftercare following joint replacement surgery: Secondary | ICD-10-CM | POA: Diagnosis not present

## 2018-01-01 DIAGNOSIS — Z96642 Presence of left artificial hip joint: Secondary | ICD-10-CM | POA: Diagnosis not present

## 2018-01-16 ENCOUNTER — Other Ambulatory Visit: Payer: Self-pay

## 2018-01-16 MED ORDER — LORAZEPAM 0.5 MG PO TABS: ORAL_TABLET | ORAL | 0 refills | Status: DC

## 2018-01-16 NOTE — Telephone Encounter (Signed)
RX faxed to [facility] @ [phone], phone number [phone] 

## 2018-01-20 ENCOUNTER — Non-Acute Institutional Stay (SKILLED_NURSING_FACILITY): Payer: Medicare Other | Admitting: Internal Medicine

## 2018-01-20 ENCOUNTER — Encounter: Payer: Self-pay | Admitting: Internal Medicine

## 2018-01-20 DIAGNOSIS — F0151 Vascular dementia with behavioral disturbance: Secondary | ICD-10-CM | POA: Diagnosis not present

## 2018-01-20 DIAGNOSIS — E782 Mixed hyperlipidemia: Secondary | ICD-10-CM

## 2018-01-20 DIAGNOSIS — F1011 Alcohol abuse, in remission: Secondary | ICD-10-CM

## 2018-01-20 DIAGNOSIS — H2513 Age-related nuclear cataract, bilateral: Secondary | ICD-10-CM | POA: Diagnosis not present

## 2018-01-20 DIAGNOSIS — Z96643 Presence of artificial hip joint, bilateral: Secondary | ICD-10-CM | POA: Diagnosis not present

## 2018-01-20 DIAGNOSIS — D649 Anemia, unspecified: Secondary | ICD-10-CM

## 2018-01-20 DIAGNOSIS — F418 Other specified anxiety disorders: Secondary | ICD-10-CM | POA: Diagnosis not present

## 2018-01-20 DIAGNOSIS — Z87898 Personal history of other specified conditions: Secondary | ICD-10-CM

## 2018-01-20 DIAGNOSIS — F01518 Vascular dementia, unspecified severity, with other behavioral disturbance: Secondary | ICD-10-CM

## 2018-01-20 DIAGNOSIS — Z7901 Long term (current) use of anticoagulants: Secondary | ICD-10-CM | POA: Diagnosis not present

## 2018-01-20 NOTE — Progress Notes (Signed)
Patient ID: Norma Ayers, female   DOB: 08/06/1951, 67 y.o.   MRN: [id number]  Location:  [facility] Room Number: 113 B Place of Service:  SNF [phone]([id number]) Provider:  DR Neeka Urista Ivan CroftS Rotunda Worden  Hellen Shanley, DO  Patient Care Team: Kirt Boysarter, Stephano Arrants, DO as PCP - General (Internal Medicine) Sharee HolsterGreen, Deborah S, NP as Nurse Practitioner (Geriatric Medicine) [facility] ([facility] Facility)  Extended Emergency Contact Information Primary Emergency Contact: Gabriel CirriKline,Norma  [place] Home Phone: [phone] Mobile Phone: [phone] Relation: Sister Secondary Emergency Contact: Norma Ayers,Doris  [place] Home Phone: [phone] Mobile Phone: [phone] Relation: Sister  Code Status:  Full Code Goals of care: Advanced Directive information Advanced Directives 01/20/2018  Does Patient Have a Medical Advance Directive? Yes  Type of Advance Directive Out of facility DNR (pink MOST or yellow form)  Does patient want to make changes to medical advance directive? No - Patient declined  Copy of Healthcare Power of Attorney in Chart? -  Would patient like information on creating a medical advance directive? -  Pre-existing out of facility DNR order (yellow form or pink MOST form) Pink MOST form placed in chart (order not valid for inpatient use)     Chief Complaint  Patient presents with  . Medical Management of Chronic Issues    1 month follow up    HPI:  Pt is a 67 y.o. female seen today for medical management of chronic diseases.  She has no concerns today. No nursing issues. Appetite ok and sleeps well. She is ambulating better. No falls. She is a poor historian due to dementia. Hx obtained from chart.  Chronic anemia - stable s/p 2 units PRBCs in the hospital in Feb 2019. Hgb 9.8  Constipation - slow transit. Stable on colace twice daily and senna 2 tabs nightly   Hx Etoh abuse - stable on thiamine and folic acid daily    Hyperlipidemia - takes lipitor 20 mg daily. LDL 152  Chronic pain due to Osteoarthritis and b/l hip avascular necrosis s/p b/l hip replacement - pain controlled on aleve 440 mg twice daily; vicodin 5/325 mg twice daily as needed   Depression with anxiety - mood stable on zoloft 75 mg daily; ativan 0.5 mg daily for anxiety. She does benefit from this regiman  Cognitive developmental delay/vascular dementia with behavioral disturbance - stable without medication.  Weight 120 lbs. Albumin 3.6. She gets nutritional supplements per facility protocol    Past Medical History:  Diagnosis Date  . Alcoholism (HCC) 09/08/2017  . Anemia   . Avascular necrosis of bones of both hips (HCC) 09/08/2017  . Cognitive developmental delay 09/08/2017  . Hyperlipidemia   . Hypertension   . Right orbit fracture (HCC) 09/08/2017  . Tongue lesion 09/08/2017   Past Surgical History:  Procedure Laterality Date  . gum tumor     removed  . TOTAL HIP ARTHROPLASTY Right 10/10/2017   Procedure: RIGHT TOTAL HIP ARTHROPLASTY ANTERIOR APPROACH;  Surgeon: Samson FredericSwinteck, Brian, MD;  Location: [facility];  Service: Orthopedics;  Laterality: Right;  Needs RNFA  . TOTAL HIP ARTHROPLASTY Left 11/21/2017   Procedure: LEFT TOTAL HIP ARTHROPLASTY ANTERIOR APPROACH;  Surgeon: Samson FredericSwinteck, Brian, MD;  Location: [facility];  Service: Orthopedics;  Laterality: Left;    No Known Allergies  Outpatient Encounter Medications as of 01/20/2018  Medication Sig  . atorvastatin (LIPITOR) 20 MG tablet Take 20 mg by mouth every evening.  . docusate sodium (COLACE) 100 MG capsule Take  1 capsule (100 mg total) by mouth 2 (two) times daily.  Marland Kitchen ENSURE (ENSURE) Give 60cc by mouth two times daily for nutritional supplement  . folic acid (FOLVITE) 1 MG tablet Take 1 tablet (1 mg total) by mouth daily.  Marland Kitchen LORazepam (ATIVAN) 0.5 MG tablet Give 1 tablet by mouth every morning  . naproxen sodium (ALEVE) 220 MG tablet Take 440 mg by mouth 2 (two) times daily.    . Nutritional Supplements (NUTRITIONAL SUPPLEMENT PO) NAS (No salt added) diet - Regular texture, regular consistency, heart healthy  . ondansetron (ZOFRAN) 4 MG tablet Take 1 tablet (4 mg total) by mouth every 6 (six) hours as needed for nausea.  Marland Kitchen senna (SENOKOT) 8.6 MG TABS tablet Take 2 tablets (17.2 mg total) by mouth at bedtime.  . sertraline (ZOLOFT) 50 MG tablet Take 75 mg by mouth daily.  Marland Kitchen thiamine 100 MG tablet Take 1 tablet (100 mg total) by mouth daily.  . [DISCONTINUED] HYDROcodone-acetaminophen (NORCO/VICODIN) 5-325 MG tablet Take 1 tablet by mouth every 12 (twelve) hours as needed for moderate pain (Per Dr. Veda Canning).   No facility-administered encounter medications on file as of 01/20/2018.     Review of Systems  Unable to perform ROS: Dementia    Immunization History  Administered Date(s) Administered  . Influenza-Unspecified 05/15/2017  . Pneumococcal-Unspecified 04/18/2014  . Tdap 09/28/2014   Pertinent  Health Maintenance Due  Topic Date Due  . MAMMOGRAM  10/21/2018 (Originally 06/18/2014)  . DEXA SCAN  10/21/2018 (Originally 04/18/2014)  . COLONOSCOPY  10/21/2018 (Originally 04/19/1999)  . PNA vac Low Risk Adult (2 of 2 - PCV13) 10/21/2018 (Originally 04/19/2015)  . INFLUENZA VACCINE  05/15/2018   Fall Risk  10/17/2017  Falls in the past year? No   Functional Status Survey:    Vitals:   01/20/18 0843  BP: 124/78  Pulse: 68  Resp: 16  Temp: 99 F (37.2 C)  SpO2: 98%  Weight: 120 lb 6.4 oz (54.6 kg)  Height: 5\' 1"  (1.549 m)   Body mass index is 22.75 kg/m. Physical Exam  Constitutional: She appears well-developed.  Frail appearing in NAD, sitting in w/c  HENT:  Mouth/Throat: Oropharynx is clear and moist. No oropharyngeal exudate.  MMM; no oral thrush  Eyes: Pupils are equal, round, and reactive to light. No scleral icterus.  Neck: Neck supple. Carotid bruit is not present. No tracheal deviation present.  Cardiovascular: Normal rate, regular rhythm and  intact distal pulses. Exam reveals no gallop and no friction rub.  Murmur (1/6 SEM) heard. No LE edema b/l. no calf TTP.   Pulmonary/Chest: Effort normal and breath sounds normal. No stridor. No respiratory distress. She has no wheezes. She has no rales. She exhibits no tenderness.  Abdominal: Soft. Normal appearance and bowel sounds are normal. She exhibits no distension and no mass. There is no hepatomegaly. There is no tenderness. There is no rigidity, no rebound and no guarding. No hernia.  Musculoskeletal: She exhibits edema.  Lymphadenopathy:    She has no cervical adenopathy.  Neurological: She is alert.  Skin: Skin is warm and dry. No rash noted.  Psychiatric: She has a normal mood and affect. Her behavior is normal.  Occasionally, words unintelligible    Labs reviewed: Recent Labs    09/09/17 0524  09/09/17 1904  09/11/17 0406 09/11/17 1826  10/11/17 0506 11/21/17 0738 11/22/17 0624  NA 115*   < > 114*   < > 118* 128*   < > 138 142 138  K 4.1   < > 3.7   < > 3.5 3.9   < > 4.0 4.0 4.2  CL 87*   < > 85*   < > 89* 100*   < > 107 108 108  CO2 22   < > 22   < > 22 23   < > 25 27 25   GLUCOSE 98   < > 96   < > 88 138*   < > 116* 90 98  BUN 9   < > 7   < > <5* 5*   < > 12 20 17   CREATININE 0.82   < > 0.73   < > 0.67 0.84   < > 0.81 0.86 0.88  CALCIUM 8.0*   < > 7.7*   < > 8.1* 8.0*   < > 8.2* 9.4 8.6*  MG 1.5*  --  2.2  --  1.2* 2.1  --   --   --   --   PHOS 1.9*  --   --   --  3.5  --   --   --   --   --    < > = values in this interval not displayed.   Recent Labs    10/03/17  AST 12*  ALT 11  ALKPHOS 80   Recent Labs    02/20/17 1853 09/07/17 1720  09/17/17  11/23/17 0558 11/24/17 0529 11/25/17 0527 12/03/17  WBC 5.5 7.5   < > 7.7   < > 9.1 9.6 9.4 6.9  NEUTROABS 2.7 6.1  --  5  --   --   --   --   --   HGB 10.5* 10.8*   < > 8.9*   < > 7.6* 9.5* 9.7* 9.8*  HCT 31.9* 30.4*   < > 27*   < > 23.9* 29.2* 30.2* 29*  MCV 82.9 76.2*   < >  --    < > 77.9* 79.1 79.7   --   PLT 290 298   < > 373   < > 226 236 263 429*   < > = values in this interval not displayed.   Lab Results  Component Value Date   TSH 1.378 09/07/2017   No results found for: HGBA1C Lab Results  Component Value Date   CHOL 219 (A) 12/03/2017   HDL 41 12/03/2017   LDLCALC 152 12/03/2017   TRIG 129 12/03/2017    Significant Diagnostic Results in last 30 days:  No results found.  Assessment/Plan   ICD-10-CM   1. Vascular dementia with behavior disturbance F01.51   2. Depression with anxiety F41.8   3. Chronic anemia D64.9   4. History of alcohol abuse Z87.898   5. Mixed hyperlipidemia E78.2   6. Status post bilateral hip replacements Z96.643    due to avascular necrosis    Cont current meds as ordered  PT/OT/ST as indicated  Cont Nutritional supplements as ordered  Will follow  Labs/tests ordered: none   Chalon Zobrist S. Ancil Linsey  [facility] [address] [place], [place] [address] [phone] Cell (Monday-Friday 8 AM - 5 PM) [phone] After 5 PM and follow prompts

## 2018-01-23 DIAGNOSIS — F1011 Alcohol abuse, in remission: Secondary | ICD-10-CM | POA: Insufficient documentation

## 2018-02-10 ENCOUNTER — Encounter: Payer: Self-pay | Admitting: Adult Health

## 2018-02-10 ENCOUNTER — Other Ambulatory Visit: Payer: Self-pay

## 2018-02-10 ENCOUNTER — Non-Acute Institutional Stay (SKILLED_NURSING_FACILITY): Payer: Medicare Other | Admitting: Adult Health

## 2018-02-10 DIAGNOSIS — M87051 Idiopathic aseptic necrosis of right femur: Secondary | ICD-10-CM

## 2018-02-10 DIAGNOSIS — F1020 Alcohol dependence, uncomplicated: Secondary | ICD-10-CM | POA: Diagnosis not present

## 2018-02-10 DIAGNOSIS — M87052 Idiopathic aseptic necrosis of left femur: Secondary | ICD-10-CM

## 2018-02-10 DIAGNOSIS — F0151 Vascular dementia with behavioral disturbance: Secondary | ICD-10-CM | POA: Diagnosis not present

## 2018-02-10 DIAGNOSIS — F01518 Vascular dementia, unspecified severity, with other behavioral disturbance: Secondary | ICD-10-CM

## 2018-02-10 MED ORDER — LORAZEPAM 0.5 MG PO TABS: ORAL_TABLET | ORAL | 0 refills | Status: DC

## 2018-02-10 NOTE — Telephone Encounter (Signed)
Discharge with patient

## 2018-02-10 NOTE — Progress Notes (Signed)
Location:   [facility] Room Number: 113 B Place of Service:  SNF (31)    CODE STATUS: Full code (Most Form Updated 11/12/17)  No Known Allergies  Chief Complaint  Patient presents with  . Discharge Note    Discharging AMA    HPI:  She is being discharged to home AMA. Her family feels as though she will be ok to be at home by herself with their periodic assistance. She will not need any dme. She will need home health for pt/ot/rn/cna/sw. She will need her prescriptions written and will need to follow up with her medical provider.  Her needs would be better served in assisted living.    Past Medical History:  Diagnosis Date  . Alcoholism (HCC) 09/08/2017  . Anemia   . Avascular necrosis of bones of both hips (HCC) 09/08/2017  . Cognitive developmental delay 09/08/2017  . Hyperlipidemia   . Hypertension   . Right orbit fracture (HCC) 09/08/2017  . Tongue lesion 09/08/2017    Past Surgical History:  Procedure Laterality Date  . gum tumor     removed  . TOTAL HIP ARTHROPLASTY Right 10/10/2017   Procedure: RIGHT TOTAL HIP ARTHROPLASTY ANTERIOR APPROACH;  Surgeon: Samson Frederic, MD;  Location: [facility];  Service: Orthopedics;  Laterality: Right;  Needs RNFA  . TOTAL HIP ARTHROPLASTY Left 11/21/2017   Procedure: LEFT TOTAL HIP ARTHROPLASTY ANTERIOR APPROACH;  Surgeon: Samson Frederic, MD;  Location: [facility];  Service: Orthopedics;  Laterality: Left;    Social History   Socioeconomic History  . Marital status: Widowed    Spouse name: Not on file  . Number of children: Not on file  . Years of education: Not on file  . Highest education level: Not on file  Occupational History  . Not on file  Social Needs  . Financial resource strain: Not on file  . Food insecurity:    Worry: Not on file    Inability: Not on file  . Transportation needs:    Medical: Not on file    Non-medical: Not on file  Tobacco Use  . Smoking status: Never Smoker  . Smokeless  tobacco: Never Used  Substance and Sexual Activity  . Alcohol use: Yes    Comment: 2 times per week  . Drug use: No  . Sexual activity: Not on file  Lifestyle  . Physical activity:    Days per week: Not on file    Minutes per session: Not on file  . Stress: Not on file  Relationships  . Social connections:    Talks on phone: Not on file    Gets together: Not on file    Attends religious service: Not on file    Active member of club or organization: Not on file    Attends meetings of clubs or organizations: Not on file    Relationship status: Not on file  . Intimate partner violence:    Fear of current or ex partner: Not on file    Emotionally abused: Not on file    Physically abused: Not on file    Forced sexual activity: Not on file  Other Topics Concern  . Not on file  Social History Narrative  . Not on file   Family History  Problem Relation Age of Onset  . Emphysema Mother   . Hypertension Sister   . Heart attack Brother     VITAL SIGNS Ht  (1.549 m)   Wt 121 lb (54.9  kg)   BMI 22.86 kg/m   Patient's Medications  New Prescriptions   No medications on file  Previous Medications   ATORVASTATIN (LIPITOR) 20 MG TABLET    Take 20 mg by mouth every evening.   DOCUSATE SODIUM (COLACE) 100 MG CAPSULE    Take 1 capsule (100 mg total) by mouth 2 (two) times daily.   ENSURE (ENSURE)    Give 60cc by mouth two times daily for nutritional supplement   FOLIC ACID (FOLVITE) 1 MG TABLET    Take 1 tablet (1 mg total) by mouth daily.   LORAZEPAM (ATIVAN) 0.5 MG TABLET    Give 1 tablet by mouth every morning   NAPROXEN SODIUM (ALEVE) 220 MG TABLET    Take 440 mg by mouth 2 (two) times daily.    NUTRITIONAL SUPPLEMENTS (NUTRITIONAL SUPPLEMENT PO)    NAS (No salt added) diet - Regular texture, regular consistency, heart healthy   ONDANSETRON (ZOFRAN) 4 MG TABLET    Take 1 tablet (4 mg total) by mouth every 6 (six) hours as needed for nausea.   SENNA (SENOKOT) 8.6 MG TABS  TABLET    Take 2 tablets (17.2 mg total) by mouth at bedtime.   SERTRALINE (ZOLOFT) 50 MG TABLET    Take 75 mg by mouth daily.   THIAMINE 100 MG TABLET    Take 1 tablet (100 mg total) by mouth daily.  Modified Medications   No medications on file  Discontinued Medications   No medications on file     SIGNIFICANT DIAGNOSTIC EXAMS  PREVIOUS:   09-07-17: chest x-ray: No active disease.   09-07-17: ct of head cervical spine; maxillofacial:  1. Acute right orbital floor and medial orbital wall fractures with fracture involving the canal for the right maxillary nerve seen along the medial orbital floor. There is 3 mm of caudal displacement of the orbital floor fracture with herniation of periorbital fat and minimal herniation of the right maxillary nerve. 2. Associated with the right orbital fractures are a small amount of medial extraconal hemorrhage/fluid as well as small amount of lateral intraconal hemorrhage. Hyperdense fluid in the right maxillary sinus would be in keeping with hemorrhage/blood products. Right periorbital soft tissue swelling is noted. 3. No acute intracranial abnormality. 4. Mild cervical spondylosis without acute cervical spine fracture.  09-07-17: right hip x-ray:  1. No acute abnormality of the bony pelvis or the right hip. 2. 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  09-07-17: ct of pelvis:  1. No acute fracture of the pelvis or hips. 2. Bilateral hip AVN and advanced osteoarthritis, findings have progressed in the right hip from CT 6 months prior.  10-10-17: pelvic x-ray: Prosthetic components of right total hip replacement well-seated. Loss of bone inferior to the acetabular portion the prosthesis is unchanged. Advanced avascular necrosis left femoral head with remodeling and foci  of calcification within the left hip joint. No acute fracture or dislocation.  11-21-17: left hip x-ray:  Left hip prosthesis in good position without complicating features.   NO NEW EXAMS      LABS REVIEWED: PREVOUS:   09-07-17: wbc 7.5; hgb 10.8; hct 30.4; mcv 76.2; plt 298; glucose 105; bun 21; creat 1.18; k+ 3.2; na++ 116; ca 8.9 ck 437; tsh 1.378 09-09-17: glucose 98; bun 9; creat 0.82; k+ 4.1; na++ 115; ca 8.0; mag 1.5; phos 1.9; uric  4.2 09-10-17: wbc 7.7; hgb 9.6; hct 26.6; mcv 75.6 plt 260; glucose 121; bun <5; creat 0.69; k+ 3.4; na++ 112; ca 8.0 09-11-17: wbc 7.1; hgb 9.1; hct 25.8; mcv 75.9; plt 277; glucose 88; bun <5; creat 0.67; k+ 3.5; na++ 118; ca 8.82mag 1.2; phos 3.5  09-11-17: (repeat) glucose 120; bun 6; creat 0.83; k+ 3.8; na++ 130; ca 8.1 09-12-17: wbc 6.9; hgb 8.9; hct 25.2; mcv 78.5; plt 275; glucose 104; bun 6; creat 0.87; k+ 4.1; na++ 130; ca 8.6 10-03-17: glucose 94; bun 9.1; creat 0.77; k+ 4.5; na++ 141; ca 9.4; liver normal albumin 3.6; mag 1.6 ck 42;  10-10-17: wbc 7.0; hgb 10.9; hct 34.7; mcv 84.0; plt 360 mag 1.6  10-11-17: glucose 116; bun 12; creat 0.81 ;k+ 4.0; na++ 138; ca 8.2 10-13-17: wbc 7.9; hgb 7.9; hct 24.9; mcv 83.3; plt 159  11-21-17: wbc 8.6; hgb 9.8; hct 32.9; mcv 79.7; plt 292; glucose 90; bun 20; creat 0.86; k+ 4.2; na++ 140; ca 9.4 11-24-17: wbc 9.6; hgb 9.5; hct 29.2; mcv 79.1 plt 236 11-25-17: wbc 9.4; hgb 9.7; hct 30.2; mcv 79.7; plt 263   NO NEW LABS    Review of Systems  Unable to perform ROS: Dementia (confused )    Physical Exam  Constitutional: No distress.  Frail   Neck: No thyromegaly present.  Cardiovascular: Normal rate, regular rhythm and intact distal pulses.  Murmur heard. 1/6  Pulmonary/Chest: Effort normal and breath sounds normal. No respiratory distress.  Abdominal: Soft. Bowel sounds are normal. She exhibits no distension. There is no tenderness.  Musculoskeletal: Normal range of motion. She exhibits no edema.    Lymphadenopathy:    She has no cervical adenopathy.  Neurological: She is alert.  Skin: Skin is warm and dry. She is not diaphoretic.  Psychiatric: She has a normal mood and affect.    ASSESSMENT/ PLAN:   Patient is being discharged with the following home health services:  Pt/ot/rn/cna/sw: to evaluate and treat as indicated for gait balance strength adl training medication management; adl care; and community outreach   Patient is being discharged with the following durable medical equipment:  Non required   Patient has been advised to f/u with their PCP in 1-2 weeks to bring them up to date on their rehab stay.  Social services at facility was responsible for arranging this appointment.  Pt was provided with a 30 day supply of prescriptions for medications and refills must be obtained from their PCP.  For controlled substances, a more limited supply may be provided adequate until PCP appointment only.  A 30 day supply of her prescriptions written per the list as above   Time spent with patient 45 minutes: to discuss AMA status; facility informed to call APS upon her discharge. Expectations of discharge family has verbalized understanding.     Synthia Innocent NP [facility]  Contact [phone] Monday through Friday 8am- 5pm  After hours call [phone]

## 2018-02-19 DIAGNOSIS — F339 Major depressive disorder, recurrent, unspecified: Secondary | ICD-10-CM | POA: Diagnosis not present

## 2018-02-19 DIAGNOSIS — F0151 Vascular dementia with behavioral disturbance: Secondary | ICD-10-CM | POA: Diagnosis not present

## 2018-02-19 DIAGNOSIS — D649 Anemia, unspecified: Secondary | ICD-10-CM | POA: Diagnosis not present

## 2018-02-19 DIAGNOSIS — I10 Essential (primary) hypertension: Secondary | ICD-10-CM | POA: Diagnosis not present

## 2018-02-19 DIAGNOSIS — Z471 Aftercare following joint replacement surgery: Secondary | ICD-10-CM | POA: Diagnosis not present

## 2018-02-19 DIAGNOSIS — F419 Anxiety disorder, unspecified: Secondary | ICD-10-CM | POA: Diagnosis not present

## 2018-02-20 DIAGNOSIS — F419 Anxiety disorder, unspecified: Secondary | ICD-10-CM | POA: Diagnosis not present

## 2018-02-20 DIAGNOSIS — F339 Major depressive disorder, recurrent, unspecified: Secondary | ICD-10-CM | POA: Diagnosis not present

## 2018-02-20 DIAGNOSIS — Z471 Aftercare following joint replacement surgery: Secondary | ICD-10-CM | POA: Diagnosis not present

## 2018-02-20 DIAGNOSIS — D649 Anemia, unspecified: Secondary | ICD-10-CM | POA: Diagnosis not present

## 2018-02-20 DIAGNOSIS — F0151 Vascular dementia with behavioral disturbance: Secondary | ICD-10-CM | POA: Diagnosis not present

## 2018-02-20 DIAGNOSIS — I10 Essential (primary) hypertension: Secondary | ICD-10-CM | POA: Diagnosis not present

## 2018-02-24 DIAGNOSIS — E7800 Pure hypercholesterolemia, unspecified: Secondary | ICD-10-CM | POA: Diagnosis not present

## 2018-02-24 DIAGNOSIS — I10 Essential (primary) hypertension: Secondary | ICD-10-CM | POA: Diagnosis not present

## 2018-02-24 DIAGNOSIS — D649 Anemia, unspecified: Secondary | ICD-10-CM | POA: Diagnosis not present

## 2018-02-26 DIAGNOSIS — I10 Essential (primary) hypertension: Secondary | ICD-10-CM | POA: Diagnosis not present

## 2018-02-26 DIAGNOSIS — D649 Anemia, unspecified: Secondary | ICD-10-CM | POA: Diagnosis not present

## 2018-02-26 DIAGNOSIS — Z471 Aftercare following joint replacement surgery: Secondary | ICD-10-CM | POA: Diagnosis not present

## 2018-02-26 DIAGNOSIS — F0151 Vascular dementia with behavioral disturbance: Secondary | ICD-10-CM | POA: Diagnosis not present

## 2018-02-26 DIAGNOSIS — F339 Major depressive disorder, recurrent, unspecified: Secondary | ICD-10-CM | POA: Diagnosis not present

## 2018-02-26 DIAGNOSIS — F419 Anxiety disorder, unspecified: Secondary | ICD-10-CM | POA: Diagnosis not present

## 2018-03-05 DIAGNOSIS — D649 Anemia, unspecified: Secondary | ICD-10-CM | POA: Diagnosis not present

## 2018-03-05 DIAGNOSIS — F0151 Vascular dementia with behavioral disturbance: Secondary | ICD-10-CM | POA: Diagnosis not present

## 2018-03-05 DIAGNOSIS — F419 Anxiety disorder, unspecified: Secondary | ICD-10-CM | POA: Diagnosis not present

## 2018-03-05 DIAGNOSIS — Z471 Aftercare following joint replacement surgery: Secondary | ICD-10-CM | POA: Diagnosis not present

## 2018-03-05 DIAGNOSIS — I10 Essential (primary) hypertension: Secondary | ICD-10-CM | POA: Diagnosis not present

## 2018-03-05 DIAGNOSIS — F339 Major depressive disorder, recurrent, unspecified: Secondary | ICD-10-CM | POA: Diagnosis not present

## 2018-03-11 DIAGNOSIS — F419 Anxiety disorder, unspecified: Secondary | ICD-10-CM | POA: Diagnosis not present

## 2018-03-11 DIAGNOSIS — F0151 Vascular dementia with behavioral disturbance: Secondary | ICD-10-CM | POA: Diagnosis not present

## 2018-03-11 DIAGNOSIS — Z471 Aftercare following joint replacement surgery: Secondary | ICD-10-CM | POA: Diagnosis not present

## 2018-03-11 DIAGNOSIS — I10 Essential (primary) hypertension: Secondary | ICD-10-CM | POA: Diagnosis not present

## 2018-03-11 DIAGNOSIS — F339 Major depressive disorder, recurrent, unspecified: Secondary | ICD-10-CM | POA: Diagnosis not present

## 2018-03-11 DIAGNOSIS — D649 Anemia, unspecified: Secondary | ICD-10-CM | POA: Diagnosis not present

## 2018-03-25 DIAGNOSIS — E7800 Pure hypercholesterolemia, unspecified: Secondary | ICD-10-CM | POA: Diagnosis not present

## 2018-03-25 DIAGNOSIS — I10 Essential (primary) hypertension: Secondary | ICD-10-CM | POA: Diagnosis not present

## 2018-06-04 ENCOUNTER — Encounter: Payer: Self-pay | Admitting: Internal Medicine

## 2019-05-29 IMAGING — CT CT HEAD W/O CM
5 of 11 series · 15 of 47 positions shown, 17 images · non-contrast
Comparison: None.

CLINICAL DATA: Patient fell last evening with bruising and swelling
of the right eye. Laceration of the eyebrow.

EXAM:
CT HEAD WITHOUT CONTRAST
CT MAXILLOFACIAL WITHOUT CONTRAST
CT CERVICAL SPINE WITHOUT CONTRAST
TECHNIQUE: Multidetector CT imaging of the head, cervical spine, and
maxillofacial structures were performed using the standard protocol
without intravenous contrast. Multiplanar CT image reconstructions
of the cervical spine and maxillofacial structures were also
generated.

[Series 5: head 3.0 mpr cor · coronal · 0.29mm/px · 2 of 67 slices shown]
[im 23/67  brain]
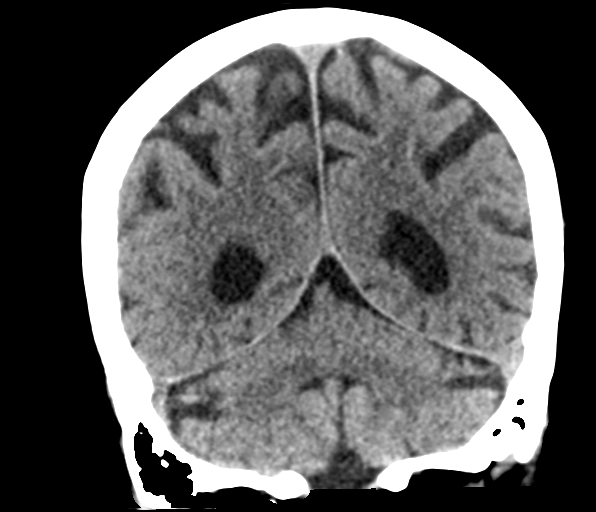
[im 45/67  brain]
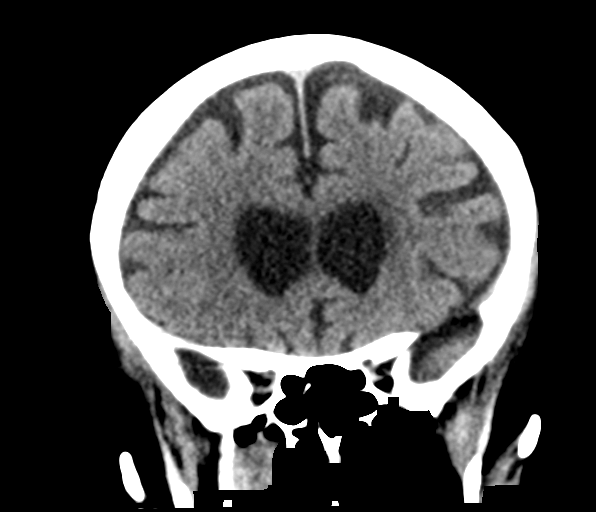

[Series 7: facial/ orbits 2.0 h30s · axial · 0.34mm/px · z∈[-140,-40]mm · 5 of 76 slices shown, 7 images]
[im 13/76  brain]
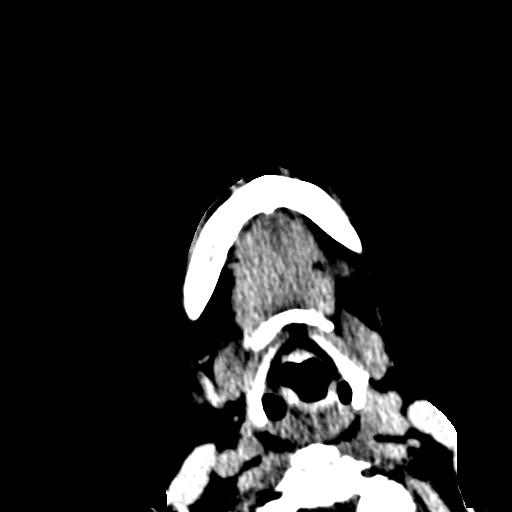
[im 13/76  bone]
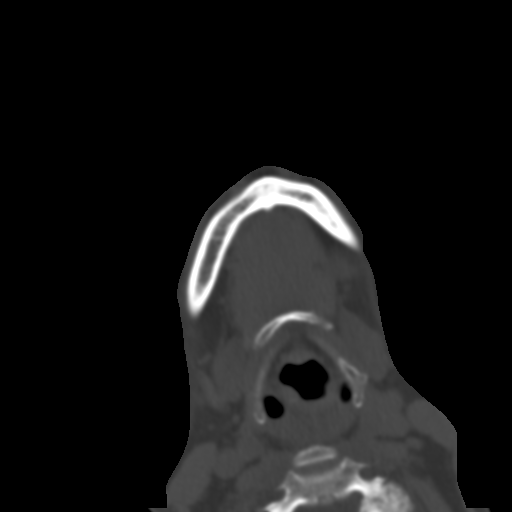
[im 26/76  brain]
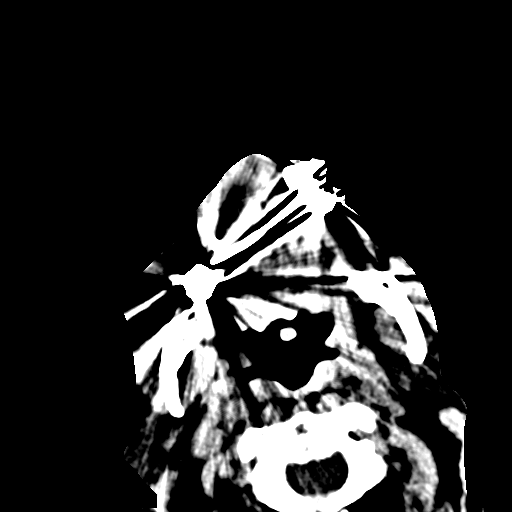
[im 38/76  brain]
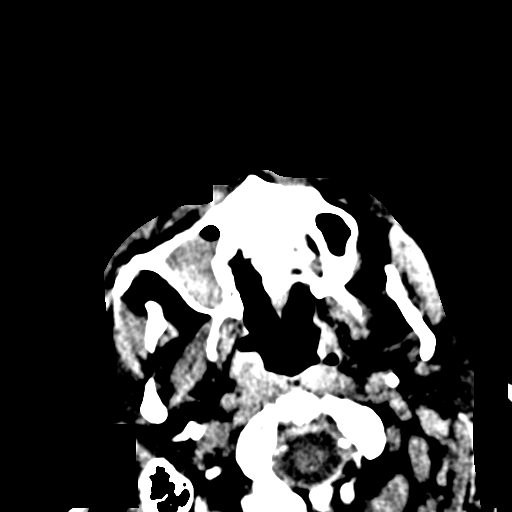
[im 51/76  brain]
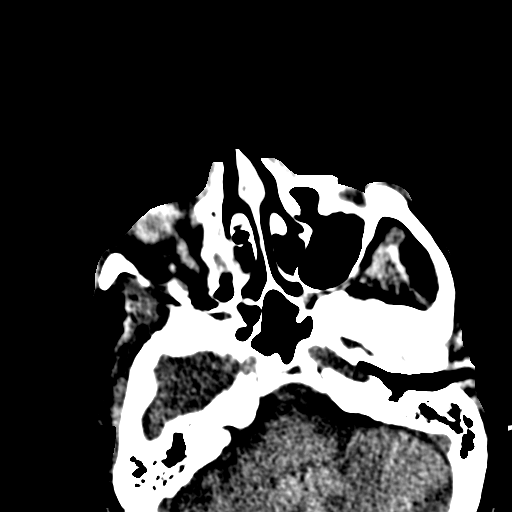
[im 63/76  brain]
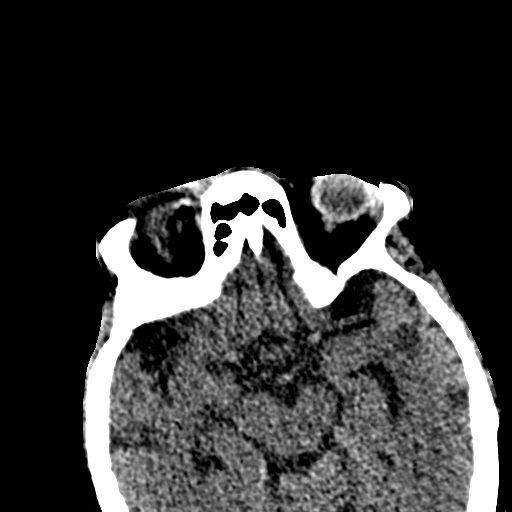
[im 63/76  bone]
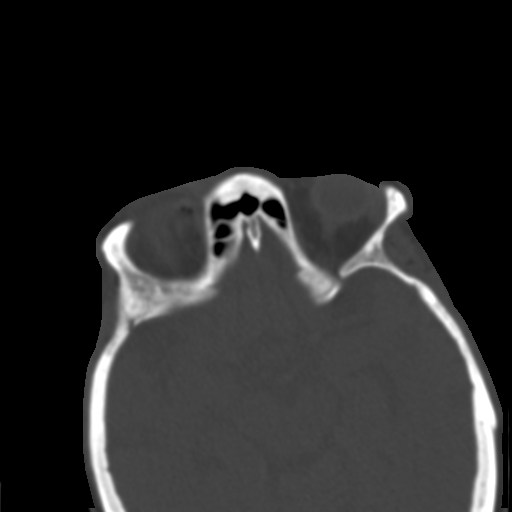

[Series 12: sagittal soft tissue · sagittal · 0.29mm/px · 1 of 76 slices shown]
[im 38/76  brain]
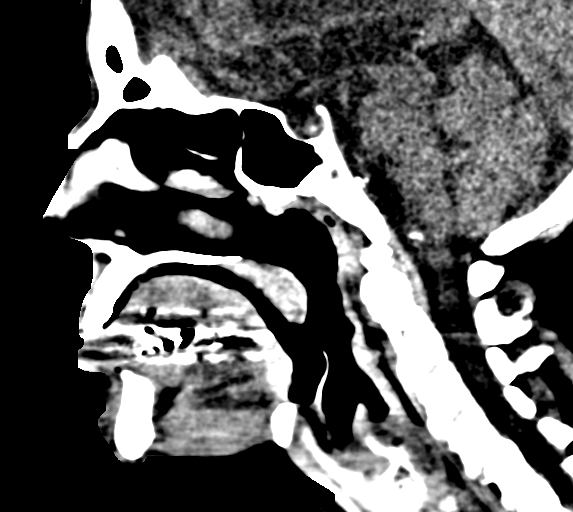

[Series 20: orthogonal axials bone · axial · 0.21mm/px · z∈[-209,-196]mm · 2 of 76 slices shown]
[im 13/76  bone]
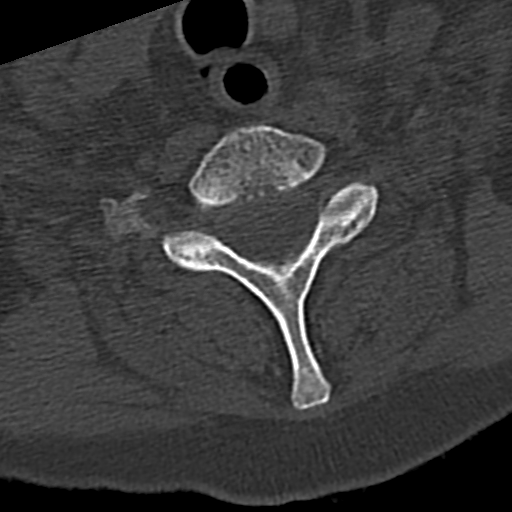
[im 26/76  bone]
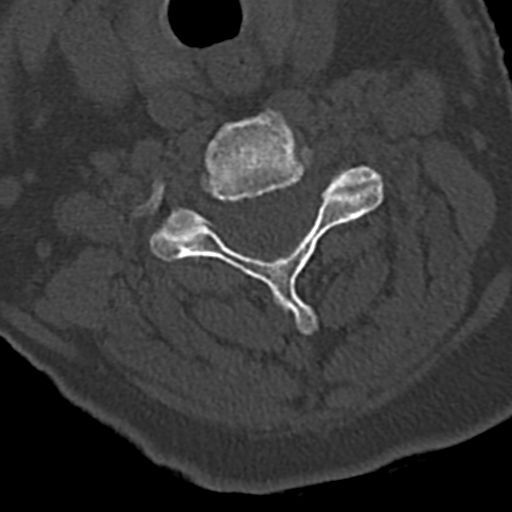

[Series 21: orthogonal axials st · axial · 0.21mm/px · z∈[-209,-123]mm · 5 of 76 slices shown]
[im 13/76  brain]
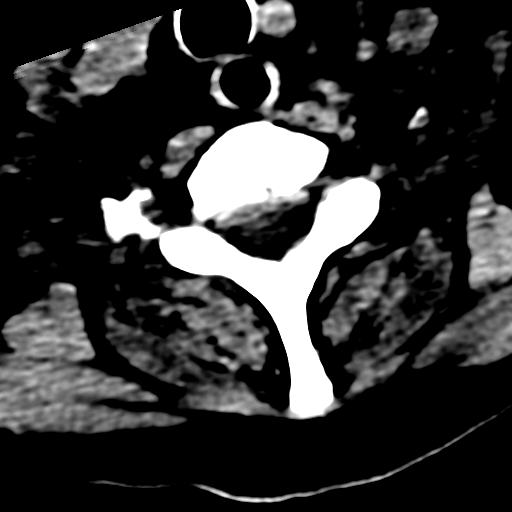
[im 26/76  brain]
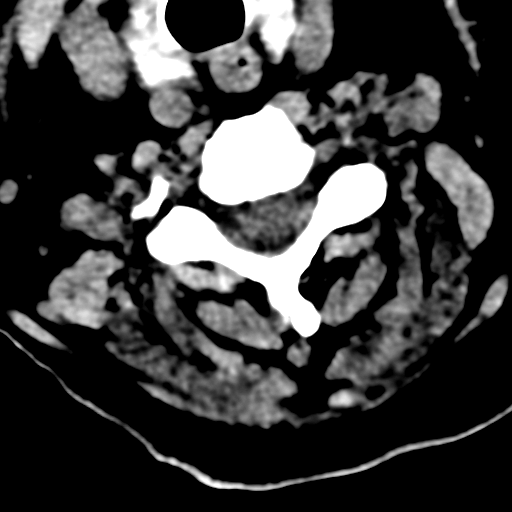
[im 38/76  brain]
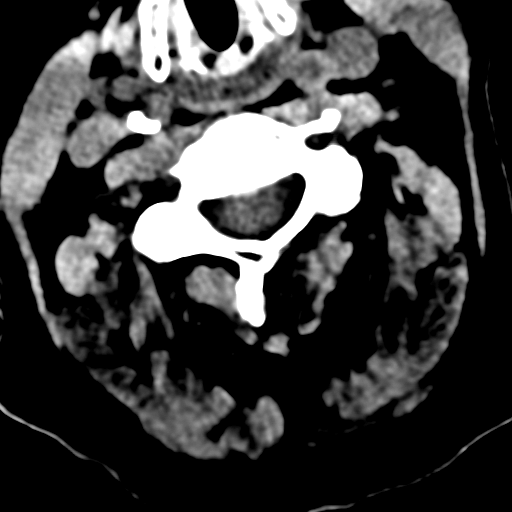
[im 51/76  brain]
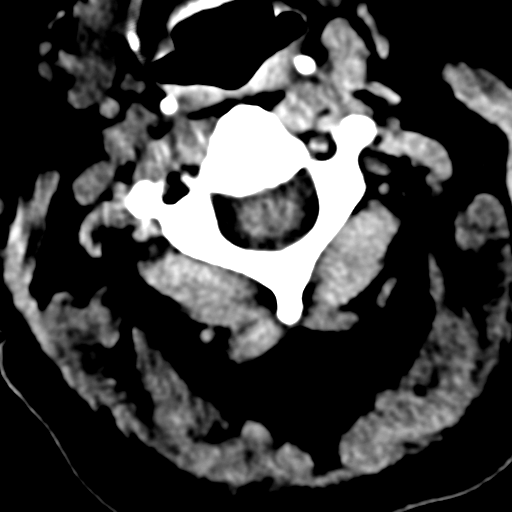
[im 63/76  brain]
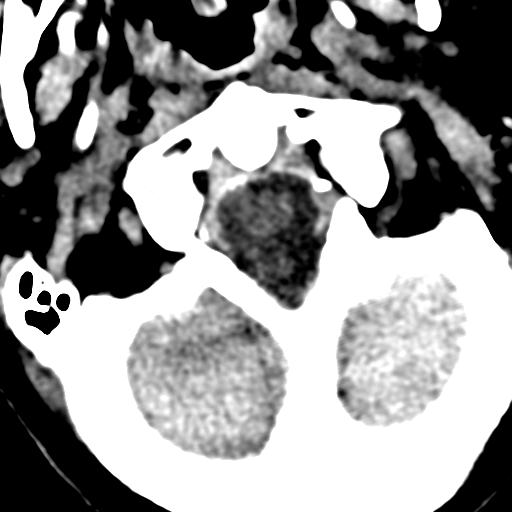

[15 of 47 positions shown; findings below may reference images not displayed]

FINDINGS: CT HEAD FINDINGS

Brain: Mild sulcal and moderate ventricular prominence consistent
with superficial and central atrophy. Chronic appearing mild to
moderate small vessel ischemic disease of periventricular white
matter. No acute intracranial hemorrhage, midline shift or edema. No
large vascular territory infarct. No extra-axial collections.

Vascular: No hyperdense vessels. Moderate atherosclerosis of the
cavernous internal carotids and both vertebral arteries.

Skull: Intact zygomatic arches.  Intact bony calvarium.

Other: None

CT MAXILLOFACIAL FINDINGS

Osseous: Acute right orbital floor fracture with fracture also
involving the canal for the right maxillary nerve. 3 mm of caudal
displacement of the fracture fragment is identified. Herniation of
orbital fat through the fracture is identified with slight
herniation of the nerve as well. Associated medial wall fracture of
the orbit is also noted. Intact zygomatic arches. Intact pterygoid
plates. The temporomandibular joints are maintained. No mandibular
fracture. Nasal bones are intact. Anterior maxillary process is
intact.

Orbits: Medial right orbital extraconal hemorrhage/edema measuring 2
mm in thickness, series 13, image 31. Small focus of intraconal
hemorrhage also noted, series 13, image 34 with retrobulbar or mild
edema. The globes appear intact.

Sinuses: Hyperdense blood products and herniated retrobulbar fat is
noted within the right maxillary sinus.

Soft tissues: Right periorbital soft tissue swelling is noted.

CT CERVICAL SPINE FINDINGS

Alignment: Slight straightening of cervical lordosis. Minimal grade
1 anterolisthesis of C4 on C5. Intact craniocervical relationship.
Osteoarthritis of the atlantodental interval.

Skull base and vertebrae: No acute fracture. No primary bone lesion
or focal pathologic process.

Soft tissues and spinal canal: No prevertebral fluid or swelling. No
visible canal hematoma.

Disc levels: Mild disc space narrowing C4-5, C5-6 and C6-7. No focal
disc herniations or significant canal stenosis. Facet arthropathy on
the left with posterior marginal osteophytes at C4-5 contribute to
left-sided neural foraminal encroachment.

Upper chest: No acute abnormality

Other: None
IMPRESSION: 1. Acute right orbital floor and medial orbital wall fractures with
fracture involving the canal for the right maxillary nerve seen
along the medial orbital floor. There is 3 mm of caudal displacement
of the orbital floor fracture with herniation of periorbital fat and
minimal herniation of the right maxillary nerve.
2. Associated with the right orbital fractures are a small amount of
medial extraconal hemorrhage/fluid as well as small amount of
lateral intraconal hemorrhage. Hyperdense fluid in the right
maxillary sinus would be in keeping with hemorrhage/blood products.
Right periorbital soft tissue swelling is noted.
3. No acute intracranial abnormality.
4. Mild cervical spondylosis without acute cervical spine fracture.

## 2019-10-21 DIAGNOSIS — E7800 Pure hypercholesterolemia, unspecified: Secondary | ICD-10-CM | POA: Diagnosis not present

## 2019-10-21 DIAGNOSIS — I10 Essential (primary) hypertension: Secondary | ICD-10-CM | POA: Diagnosis not present

## 2020-03-25 DIAGNOSIS — Z23 Encounter for immunization: Secondary | ICD-10-CM | POA: Diagnosis not present

## 2021-05-20 ENCOUNTER — Emergency Department (HOSPITAL_COMMUNITY)
Admission: EM | Admit: 2021-05-20 | Discharge: 2021-05-21 | Disposition: A | Discharge status: Home / Self Care | Payer: Medicare Other | Attending: Emergency Medicine | Admitting: Emergency Medicine

## 2021-05-20 ENCOUNTER — Emergency Department (HOSPITAL_COMMUNITY): Payer: Medicare Other

## 2021-05-20 DIAGNOSIS — R569 Unspecified convulsions: Secondary | ICD-10-CM | POA: Diagnosis present

## 2021-05-20 DIAGNOSIS — F0151 Vascular dementia with behavioral disturbance: Secondary | ICD-10-CM | POA: Diagnosis not present

## 2021-05-20 DIAGNOSIS — I10 Essential (primary) hypertension: Secondary | ICD-10-CM | POA: Diagnosis not present

## 2021-05-20 DIAGNOSIS — Z79899 Other long term (current) drug therapy: Secondary | ICD-10-CM | POA: Diagnosis not present

## 2021-05-20 DIAGNOSIS — Z96643 Presence of artificial hip joint, bilateral: Secondary | ICD-10-CM | POA: Diagnosis not present

## 2021-05-20 LAB — CBC WITH DIFFERENTIAL/PLATELET
Abs Immature Granulocytes: 0.03 10*3/uL (ref 0.00–0.07)
Basophils Absolute: 0.1 10*3/uL (ref 0.0–0.1)
Basophils Relative: 1 %
Eosinophils Absolute: 0.3 10*3/uL (ref 0.0–0.5)
Eosinophils Relative: 3 %
HCT: 46.1 % — ABNORMAL HIGH (ref 36.0–46.0)
Hemoglobin: 14.4 g/dL (ref 12.0–15.0)
Immature Granulocytes: 0 %
Lymphocytes Relative: 22 %
Lymphs Abs: 1.7 10*3/uL (ref 0.7–4.0)
MCH: 27.2 pg (ref 26.0–34.0)
MCHC: 31.2 g/dL (ref 30.0–36.0)
MCV: 87 fL (ref 80.0–100.0)
Monocytes Absolute: 0.6 10*3/uL (ref 0.1–1.0)
Monocytes Relative: 8 %
Neutro Abs: 5.1 10*3/uL (ref 1.7–7.7)
Neutrophils Relative %: 66 %
Platelets: 207 10*3/uL (ref 150–400)
RBC: 5.3 MIL/uL — ABNORMAL HIGH (ref 3.87–5.11)
RDW: 13.6 % (ref 11.5–15.5)
WBC: 7.7 10*3/uL (ref 4.0–10.5)
nRBC: 0 % (ref 0.0–0.2)

## 2021-05-20 LAB — COMPREHENSIVE METABOLIC PANEL
ALT: 19 U/L (ref 0–44)
AST: 20 U/L (ref 15–41)
Albumin: 3.9 g/dL (ref 3.5–5.0)
Alkaline Phosphatase: 69 U/L (ref 38–126)
Anion gap: 9 (ref 5–15)
BUN: 16 mg/dL (ref 8–23)
CO2: 24 mmol/L (ref 22–32)
Calcium: 9.2 mg/dL (ref 8.9–10.3)
Chloride: 100 mmol/L (ref 98–111)
Creatinine, Ser: 1.08 mg/dL — ABNORMAL HIGH (ref 0.44–1.00)
GFR, Estimated: 55 mL/min — ABNORMAL LOW (ref >60)
Glucose, Bld: 119 mg/dL — ABNORMAL HIGH (ref 70–99)
Potassium: 3.8 mmol/L (ref 3.5–5.1)
Sodium: 133 mmol/L — ABNORMAL LOW (ref 135–145)
Total Bilirubin: 0.7 mg/dL (ref 0.3–1.2)
Total Protein: 7.1 g/dL (ref 6.5–8.1)

## 2021-05-20 LAB — ETHANOL: Alcohol, Ethyl (B): <10 mg/dL (ref <10)

## 2021-05-20 LAB — MAGNESIUM: Magnesium: 2 mg/dL (ref 1.7–2.4)

## 2021-05-20 NOTE — ED Provider Notes (Signed)
[facility] EMERGENCY DEPARTMENT Provider Note   CSN: [id number] Arrival date & time: 05/20/21  2150     History Chief Complaint  Patient presents with   Seizures    Norma Ayers is a 70 y.o. female.  Patient is a 70 year old female with past medical history of MR, hypertension, hyperlipidemia, chronic alcoholism.  Patient brought for evaluation of probable seizure activity.  According to the patient's sister, she witnessed her this evening to have her "eyes rolled back in her head, then began shaking all over".  This lasted for approximately 3 minutes, then resolved.  This was followed by a period of confusion.  Patient was just coming around about the time EMS arrived.  Patient was transported here.  Patient is difficult to get a history from as her vocabulary is very limited.  She will only answer yes and giggle inappropriately when I attempt to speak with her.  According to her sisters, this seems to be her baseline.  Family members tell me that she continues to drink 7-10 beers per day.  They are unsure if she was consuming alcohol this evening.  The history is provided by the patient.      Past Medical History:  Diagnosis Date   Alcoholism (HCC) 09/08/2017   Anemia    Avascular necrosis of bones of both hips (HCC) 09/08/2017   Cognitive developmental delay 09/08/2017   Hyperlipidemia    Hypertension    Right orbit fracture (HCC) 09/08/2017   Tongue lesion 09/08/2017    Patient Active Problem List   Diagnosis Date Noted   History of alcohol abuse 01/23/2018   Slow transit constipation 12/26/2017   Chronic anemia 12/26/2017   Osteoarthritis of hips, bilateral 11/21/2017   Vascular dementia with behavior disturbance (HCC) 10/16/2017   Status post bilateral hip replacements 10/16/2017   Hypomagnesemia 10/15/2017   Agitation 10/15/2017   Depression with anxiety 10/11/2017   Alcoholism (HCC) 09/08/2017   Tongue lesion 09/08/2017   Cognitive  developmental delay 09/08/2017   Avascular necrosis of bones of both hips (HCC) 09/08/2017   Mixed hyperlipidemia     Past Surgical History:  Procedure Laterality Date   gum tumor     removed   TOTAL HIP ARTHROPLASTY Right 10/10/2017   Procedure: RIGHT TOTAL HIP ARTHROPLASTY ANTERIOR APPROACH;  Surgeon: Samson Frederic, MD;  Location: [facility];  Service: Orthopedics;  Laterality: Right;  Needs RNFA   TOTAL HIP ARTHROPLASTY Left 11/21/2017   Procedure: LEFT TOTAL HIP ARTHROPLASTY ANTERIOR APPROACH;  Surgeon: Samson Frederic, MD;  Location: [facility];  Service: Orthopedics;  Laterality: Left;     OB History   No obstetric history on file.     Family History  Problem Relation Age of Onset   Emphysema Mother    Hypertension Sister    Heart attack Brother     Social History   Tobacco Use   Smoking status: Never   Smokeless tobacco: Never  Vaping Use   Vaping Use: Never used  Substance Use Topics   Alcohol use: Yes    Comment: 2 times per week   Drug use: No    Home Medications Prior to Admission medications   Medication Sig Start Date End Date Taking? Authorizing Provider  atorvastatin (LIPITOR) 20 MG tablet Take 20 mg by mouth every evening. 12/05/17   [provider]  docusate sodium (COLACE) 100 MG capsule Take 1 capsule (100 mg total) by mouth 2 (two) times daily. 10/11/17   Samson Frederic, MD  ENSURE (ENSURE) Give 60cc by mouth two times daily for nutritional supplement    [provider]  folic acid (FOLVITE) 1 MG tablet Take 1 tablet (1 mg total) by mouth daily. 09/13/17   Zannie Cove, MD  LORazepam (ATIVAN) 0.5 MG tablet Give 1 tablet by mouth every morning 02/10/18   Sharee Holster, NP  naproxen sodium (ALEVE) 220 MG tablet Take 440 mg by mouth 2 (two) times daily.     [provider]  Nutritional Supplements (NUTRITIONAL SUPPLEMENT PO) NAS (No salt added) diet - Regular texture, regular consistency, heart healthy    [provider]  ondansetron (ZOFRAN) 4 MG tablet Take 1 tablet (4 mg total) by mouth every 6 (six) hours as needed for nausea. 10/11/17   Swinteck, Arlys John, MD  senna (SENOKOT) 8.6 MG TABS tablet Take 2 tablets (17.2 mg total) by mouth at bedtime. 10/11/17   Swinteck, Arlys John, MD  sertraline (ZOLOFT) 50 MG tablet Take 75 mg by mouth daily.    [provider]  thiamine 100 MG tablet Take 1 tablet (100 mg total) by mouth daily. 09/13/17   Zannie Cove, MD    Allergies    Patient has no known allergies.  Review of Systems   Review of Systems  Unable to perform ROS: Mental status change   Physical Exam Updated Vital Signs BP 118/67 (BP Location: Right Arm)   Pulse 78   Temp 98.2 F (36.8 C) (Oral)   Resp 18   SpO2 98%   Physical Exam Vitals and nursing note reviewed.  Constitutional:      General: She is not in acute distress.    Appearance: She is well-developed. She is not diaphoretic.     Comments: Patient is awake and alert.  She only answers yes when asked questions and seems to giggle inappropriately.  HENT:     Head: Normocephalic and atraumatic.  Cardiovascular:     Rate and Rhythm: Normal rate and regular rhythm.     Heart sounds: No murmur heard.   No friction rub. No gallop.  Pulmonary:     Effort: Pulmonary effort is normal. No respiratory distress.     Breath sounds: Normal breath sounds. No wheezing.  Abdominal:     General: Bowel sounds are normal. There is no distension.     Palpations: Abdomen is soft.     Tenderness: There is no abdominal tenderness.  Musculoskeletal:        General: Normal range of motion.     Cervical back: Normal range of motion and neck supple.  Skin:    General: Skin is warm and dry.  Neurological:     General: No focal deficit present.     Mental Status: She is alert.    ED Results / Procedures / Treatments   Labs (all labs ordered are listed, but only abnormal results are displayed) Labs Reviewed  CBC WITH DIFFERENTIAL/PLATELET  - Abnormal; Notable for the following components:      Result Value   RBC 5.30 (*)    HCT 46.1 (*)    All other components within normal limits  COMPREHENSIVE METABOLIC PANEL - Abnormal; Notable for the following components:   Sodium 133 (*)    Glucose, Bld 119 (*)    Creatinine, Ser 1.08 (*)    GFR, Estimated 55 (*)    All other components within normal limits  MAGNESIUM  ETHANOL  URINALYSIS, ROUTINE W REFLEX MICROSCOPIC  CBG MONITORING, ED    EKG  None  Radiology CT HEAD WO CONTRAST  Result Date: 05/20/2021 CLINICAL DATA:  Seizure EXAM: CT HEAD WITHOUT CONTRAST TECHNIQUE: Contiguous axial images were obtained from the base of the skull through the vertex without intravenous contrast. COMPARISON:  09/07/2017 FINDINGS: Brain: There is atrophy and chronic small vessel disease changes. No acute intracranial abnormality. Specifically, no hemorrhage, hydrocephalus, mass lesion, acute infarction, or significant intracranial injury. Vascular: No hyperdense vessel or unexpected calcification. Skull: No acute calvarial abnormality. Sinuses/Orbits: No acute findings Other: None IMPRESSION: Atrophy, chronic microvascular disease. No acute intracranial abnormality. Electronically Signed   By: Charlett Nose M.D.   On: 05/20/2021 22:17    Procedures Procedures   Medications Ordered in ED Medications - No data to display  ED Course  I have reviewed the triage vital signs and the nursing notes.  Pertinent labs & imaging results that were available during my care of the patient were reviewed by me and considered in my medical decision making (see chart for details).    MDM Rules/Calculators/A&P  Patient brought by EMS for evaluation of seizure-like activity, the details of which are described in the HPI.  Patient does have a history of chronic alcohol use and I suspect that alcohol withdrawal is the likely etiology.  Patient does not appear tremulous and vital signs are stable.  She was given 1  mg of Ativan.  Patient's work-up unremarkable including head CT and laboratory studies.  She appears neurologically intact and at her baseline.  Care discussed with Dr. Amada Jupiter from neurology.  He feels as though outpatient follow-up with neurology is appropriate and that no emergent work-up is indicated.  I also spoke with her sister and medical POA, Christiane Ha.  She believes she is at her baseline and is comfortable with the disposition.  Referral will be placed to [facility] neurology.  Final Clinical Impression(s) / ED Diagnoses Final diagnoses:  None    Rx / DC Orders ED Discharge Orders     None        Geoffery Lyons, MD 05/21/21 [phone]

## 2021-05-20 NOTE — ED Provider Notes (Signed)
Emergency Medicine Provider Triage Evaluation Note  Norma Ayers , a 70 y.o. female  was evaluated in triage.  Pt complains of new onset seizure.  Unable to obtain history from the patient due to questionable aphasia.  Patient mostly answers yes or giggles.  According to EMS the patient has only been giggling as a response to any type of communication for past 3 months.  She has a history of MR but this appears new..  Review of Systems  Unable to review systems due to inability to communicate Physical Exam  There were no vitals taken for this visit. Gen:   Awake, no distress   Resp:  Normal effort  MSK:   Moves extremities without difficulty  Other:  Smells of foul urine  Medical Decision Making  Medically screening exam initiated at 9:52 PM.  Appropriate orders placed.  DONIE MOULTON was informed that the remainder of the evaluation will be completed by another provider, this initial triage assessment does not replace that evaluation, and the importance of remaining in the ED until their evaluation is complete.  Seizure work up initiated   Arthor Captain, PA-C 05/20/21 2230    Charlynne Pander, MD 05/20/21 [phone]

## 2021-05-20 NOTE — ED Triage Notes (Signed)
Pt brought to ED triage by [facility] EMS via wheelchair with c/o seizure activity. Per EMS, pt has history of mental retardation and family states that approximately 3 month ago p became nonverbal and laughs as a form of communication but was never evaluated for those changes. Pt has no known history of seizures.

## 2021-05-21 MED ORDER — LORAZEPAM 1 MG PO TABS
1.0000 mg | ORAL_TABLET | Freq: Once | ORAL | Status: AC
  Administered 2021-05-21: 1 mg via ORAL
  Filled 2021-05-21: qty 1

## 2021-05-21 NOTE — ED Notes (Signed)
E-signature pad unavailable at time of pt discharge. This RN discussed discharge materials with pt and pt's family and answered all questions. Pt's family stated understanding of discharge material.

## 2021-05-21 NOTE — Discharge Instructions (Addendum)
Follow-up with [facility] neurology in the next 1 to 2 weeks.  A referral to their clinic has been placed for you and you should hear from them soon.  If you have not heard from them in the next 2 to 3 days, please call to make these arrangements.  Return to the emergency department in the meantime if you develop any new and/or concerning symptoms.

## 2021-05-24 ENCOUNTER — Other Ambulatory Visit: Payer: Self-pay

## 2021-05-24 ENCOUNTER — Ambulatory Visit (INDEPENDENT_AMBULATORY_CARE_PROVIDER_SITE_OTHER): Payer: Medicare Other | Admitting: Neurology

## 2021-05-24 ENCOUNTER — Encounter: Payer: Self-pay | Admitting: Neurology

## 2021-05-24 DIAGNOSIS — F0391 Unspecified dementia with behavioral disturbance: Secondary | ICD-10-CM | POA: Diagnosis not present

## 2021-05-24 DIAGNOSIS — F1020 Alcohol dependence, uncomplicated: Secondary | ICD-10-CM | POA: Diagnosis not present

## 2021-05-24 DIAGNOSIS — R4689 Other symptoms and signs involving appearance and behavior: Secondary | ICD-10-CM

## 2021-05-24 MED ORDER — QUETIAPINE FUMARATE 25 MG PO TABS: 25.0000 mg | ORAL_TABLET | Freq: Two times a day (BID) | ORAL | 0 refills | Status: DC

## 2021-05-24 NOTE — Progress Notes (Signed)
[facility]  PATIENT: Norma Ayers DOB: [birth year]  REFERRING CLINICIAN: Geoffery Lyons, MD HISTORY FROM: Norma Ayers (sister) and Management consultant (Cousin)  REASON FOR VISIT: Seizure/Abnormal behavior/Neurocognitive decline   HISTORICAL  CHIEF COMPLAINT:  Chief Complaint  Patient presents with   seizure    New patient, seen at [facility], family witnessed seizure, no prior history of seizures Room 13, sister Norma Ayers in room    HISTORY OF PRESENT ILLNESS:  This is a 70 yo woman with history of global developmental delay, chronic alcoholism, depression anxiety who is presenting with sister Norma Ayers and cousin Norma Ayers for follow up after a seizure last week. Per sister, patient was sitting at the table, trying to fix her a sandwich, then slump over the chair with eyes rolled back, drooling, she looks like she passed out, no reported shaking, episode lasted 3 to 5 minutes.  Sister reported she snapped out of it when EMS arrived.  She was taken to the hospital where she had a Head CT which showed global atrophy and chronic microvascular disease.  She was observed in the hospital and  family she was back to her baseline therefore she was discharged home with no follow-up.  In the hospital they were told that the seizure might be due to due to alcohol withdrawal.  Alcohol level was checked and it was less than 10.  Since being home no reported seizure or seizure-like activity.  Per family at baseline she cannot do anything for herself family helps her with her daily activity. They will give her meds, family helps her with meals, bathing and dressing.  They reported to symptoms are getting worse and currently she is having urinary incontinence.  It remains unclear how long the decline has been going on but for more than 3 years but worse in the past 84-months.  From family report, she has been less cooperative, she has regression in language,  she only answers by giggling and laughing but  she cannot follow command.  At baseline at her best mental status the family reported patient cannot hold a conversation. She can answer with short one-word response but she has not been able to hold conversation.  She did go to school but went to [employer]. She lives in her house with both sisters living walking distance from her.  One of her sister will always give her food and give her medications. They are very concerned for her wellbeing, she has been falling more lately,  and not following command.  She continues to drink alcohol every single day.  It seems like her son is the one providing the alcohol.  If she cannot find the alcohol in the house she is able to go to the store to get a beer.  The cousin Norma Ayers at bedside reports incidents where he  found her wandering down the street, he also found her sitting on the side way of the street.  Family is very concerned for her wellbeing.  At one point she was admitted in the nursing home but she was combative and they have to take her out of the  nursing home.    OTHER MEDICAL CONDITIONS: Cognitive developmental delay, chronic alcoholism, Agitation, Depression, anxiety    REVIEW OF SYSTEMS: Unable to perform full ROS due to patient mental status  ALLERGIES: No Known Allergies  HOME MEDICATIONS: Outpatient Medications Prior to Visit  Medication Sig Dispense Refill   atorvastatin (LIPITOR) 20 MG tablet Take 20 mg by mouth  every evening.     ENSURE (ENSURE) Give 60cc by mouth two times daily for nutritional supplement     folic acid (FOLVITE) 1 MG tablet Take 1 mg by mouth daily.     losartan (COZAAR) 25 MG tablet Take 25 mg by mouth daily.     sertraline (ZOLOFT) 50 MG tablet Take 75 mg by mouth daily.     thiamine 100 MG tablet Take 1 tablet (100 mg total) by mouth daily.     docusate sodium (COLACE) 100 MG capsule Take 1 capsule (100 mg total) by mouth 2 (two) times daily. 10 capsule 0   folic acid (FOLVITE) 1 MG tablet Take 1 tablet (1  mg total) by mouth daily.     LORazepam (ATIVAN) 0.5 MG tablet Give 1 tablet by mouth every morning 10 tablet 0   naproxen sodium (ALEVE) 220 MG tablet Take 440 mg by mouth 2 (two) times daily.      Nutritional Supplements (NUTRITIONAL SUPPLEMENT PO) NAS (No salt added) diet - Regular texture, regular consistency, heart healthy     ondansetron (ZOFRAN) 4 MG tablet Take 1 tablet (4 mg total) by mouth every 6 (six) hours as needed for nausea. 20 tablet 0   senna (SENOKOT) 8.6 MG TABS tablet Take 2 tablets (17.2 mg total) by mouth at bedtime. 120 each 0   No facility-administered medications prior to visit.    PAST MEDICAL HISTORY: Past Medical History:  Diagnosis Date   Alcoholism (HCC) 09/08/2017   Anemia    Avascular necrosis of bones of both hips (HCC) 09/08/2017   Cognitive developmental delay 09/08/2017   Hyperlipidemia    Hypertension    Right orbit fracture (HCC) 09/08/2017   Tongue lesion 09/08/2017    PAST SURGICAL HISTORY: Past Surgical History:  Procedure Laterality Date   gum tumor     removed   TOTAL HIP ARTHROPLASTY Right 10/10/2017   Procedure: RIGHT TOTAL HIP ARTHROPLASTY ANTERIOR APPROACH;  Surgeon: Samson Frederic, MD;  Location: [facility];  Service: Orthopedics;  Laterality: Right;  Needs RNFA   TOTAL HIP ARTHROPLASTY Left 11/21/2017   Procedure: LEFT TOTAL HIP ARTHROPLASTY ANTERIOR APPROACH;  Surgeon: Samson Frederic, MD;  Location: [facility];  Service: Orthopedics;  Laterality: Left;    FAMILY HISTORY: Family History  Problem Relation Age of Onset   Emphysema Mother    Hypertension Sister    Heart attack Brother     SOCIAL HISTORY: Social History   Socioeconomic History   Marital status: Widowed    Spouse name: Not on file   Number of children: Not on file   Years of education: Not on file   Highest education level: Not on file  Occupational History   Not on file  Tobacco Use   Smoking status: Never   Smokeless tobacco: Never  Vaping Use   Vaping  Use: Never used  Substance and Sexual Activity   Alcohol use: Yes    Comment: anything she can get her hands on   Drug use: No   Sexual activity: Not on file  Other Topics Concern   Not on file  Social History Narrative   Lives alone   Right Handed   Drinks coffee, unsure of amount   Unable to answer for herself.   Social Determinants of Health   Financial Resource Strain: Not on file  Food Insecurity: Not on file  Transportation Needs: Not on file  Physical Activity: Not on file  Stress: Not on file  Social Connections:  Not on file  Intimate Partner Violence: Not on file     PHYSICAL EXAM   GENERAL EXAM/CONSTITUTIONAL: Vitals: There were no vitals filed for this visit. There is no height or weight on file to calculate BMI. Wt Readings from Last 3 Encounters:  02/10/18 121 lb (54.9 kg)  01/20/18 120 lb 6.4 oz (54.6 kg)  12/26/17 115 lb 3.2 oz (52.3 kg)   Patient is in no distress; poor cooperation due to dementia. Limited physical examination.   CARDIOVASCULAR: Examination of carotid arteries is normal; no carotid bruits Regular rate and rhythm, no murmurs Examination of peripheral vascular system by observation and palpation is normal  EYES: Pupils round and reactive to light, Visual fields full to confrontation, Extraocular movements intacts,     NEUROLOGIC: Unable to perform mental status exam, patient does not follow commands but she was able to state her name and write her name on a piece of paper.    DIAGNOSTIC DATA (LABS, IMAGING, TESTING) - I reviewed patient records, labs, notes, testing and imaging myself where available.  Lab Results  Component Value Date   WBC 7.7 05/20/2021   HGB 14.4 05/20/2021   HCT 46.1 (H) 05/20/2021   MCV 87.0 05/20/2021   PLT 207 05/20/2021      Component Value Date/Time   NA 133 (L) 05/20/2021 2205   NA 141 10/03/2017 0000   K 3.8 05/20/2021 2205   CL 100 05/20/2021 2205   CO2 24 05/20/2021 2205   GLUCOSE 119  (H) 05/20/2021 2205   BUN 16 05/20/2021 2205   BUN 9 10/03/2017 0000   CREATININE 1.08 (H) 05/20/2021 2205   CALCIUM 9.2 05/20/2021 2205   PROT 7.1 05/20/2021 2205   ALBUMIN 3.9 05/20/2021 2205   AST 20 05/20/2021 2205   ALT 19 05/20/2021 2205   ALKPHOS 69 05/20/2021 2205   BILITOT 0.7 05/20/2021 2205   GFRNONAA 55 (L) 05/20/2021 2205   GFRAA >60 11/22/2017 0624   Lab Results  Component Value Date   CHOL 219 (A) 12/03/2017   HDL 41 12/03/2017   LDLCALC 152 12/03/2017   TRIG 129 12/03/2017   No results found for: HGBA1C Lab Results  Component Value Date   VITAMINB12 406 09/17/2017   Lab Results  Component Value Date   TSH 1.378 09/07/2017    Head CT 05/20/2021: Atrophy, chronic microvascular disease. No acute intracranial abnormality.    ASSESSMENT AND PLAN  70 y.o. year old female with global developmental delay, chronic alcoholism, depression anxiety who is presenting with sister Norma Ayers and cousin Norma Ayers for follow up after a seizure last week. Seizure was thought to be secondary to alcohol withdrawal. She is a chronic alcoholic and on that specific day her level was less than 10. No previous history of seizure. Review on CT scan shows global atrophy and chronic microvascular changes. It is likely than not that patient has dementia based on history, neuro examination and brain images. I recommended low dose Seroquel to help with her behavior and provided resources regarding nursing homes and dementia support around the area.    1. Abnormal behavior   2. Alcoholism (HCC)   3. Dementia with behavioral disturbance, unspecified dementia type (HCC)     PLAN: Start Seroquel 25 mg twice a day  Follow up with your primary care doctor Return to clinic in 6 months or sooner if worse  No orders of the defined types were placed in this encounter.   Meds ordered this encounter  Medications  QUEtiapine (SEROQUEL) 25 MG tablet    Sig: Take 1 tablet (25 mg total) by mouth 2 (two)  times daily.    Dispense:  60 tablet    Refill:  0    Return in about 6 months (around 11/24/2021).    Windell NorfolkAmadou Javi Bollman, MD 05/24/2021, 4:46 PM  [facility] [address], [address] [place], [place] [id number] [phone]

## 2021-05-24 NOTE — Patient Instructions (Signed)
Start Seroquel 25 mg twice a day  Follow up with your primary care doctor Return to clinic in 6 months or sooner if worse

## 2021-05-31 ENCOUNTER — Telehealth: Payer: Self-pay

## 2021-05-31 NOTE — Telephone Encounter (Signed)
(:  2:49p) SW received a call back from patient's sister-[name]. Initial RN/SW visit is scheduled for 06/08/21 @ 11:30 am

## 2021-05-31 NOTE — Telephone Encounter (Signed)
(  1:59 pm) SW left a message for patient's sister-[name] Clide Cliff in a effort to schedule initial SW/RN support. SW received her voicemail and left a message, requesting a call back.

## 2021-06-05 ENCOUNTER — Encounter: Payer: Self-pay | Admitting: *Deleted

## 2021-06-05 MED ORDER — QUETIAPINE FUMARATE 25 MG PO TABS: 50.0000 mg | ORAL_TABLET | Freq: Two times a day (BID) | ORAL | 3 refills | Status: DC

## 2021-06-05 NOTE — Telephone Encounter (Signed)
Noted. The patient is not using Ativan. She is using quetiapine 25mg , one tab BID. There is a separate mychart message concerning this patient.

## 2021-06-05 NOTE — Telephone Encounter (Signed)
I returned the call to her sister, Kandis Nab (on [place]). They are not giving her Ativan. They are giving her the prescribed quetiapine 25mg , one tab at 8am and one tab at 6pm. Reports the patient is still agitated and combative during the day. Napping a lot. The patient lives alone but has two sister within walking distance. She also wandered out of her home this past weekend, in the dark, walking down the road. She was trying to get to her sister's house. Scheduled for a home evaluation on 06/08/21 (06/10/21, nurse) to see what services need to be in place for safely,  Her sister is asking if adjustments can be made to her medications. She would like a call back. Child psychotherapist [phone].

## 2021-06-08 ENCOUNTER — Other Ambulatory Visit: Payer: Medicare Other | Admitting: *Deleted

## 2021-06-08 ENCOUNTER — Other Ambulatory Visit: Payer: Medicare Other

## 2021-06-08 ENCOUNTER — Other Ambulatory Visit: Payer: Self-pay

## 2021-06-08 VITALS — BP 119/78 | HR 69 | Temp 97.6°F | Resp 16

## 2021-06-08 DIAGNOSIS — Z515 Encounter for palliative care: Secondary | ICD-10-CM

## 2021-06-08 NOTE — Progress Notes (Signed)
COMMUNITY PALLIATIVE CARE SW NOTE  PATIENT NAME: Norma Ayers DOB: Mar 20, 1951 MRN: [id number]  PRIMARY CARE PROVIDER: Richmond Campbell., PA-C  RESPONSIBLE PARTY:  Acct ID - Guarantor Home Phone Work Phone Relationship Acct Type  [id number] RON, JUNCO* [phone]  Self P/F     [address], [place], [place] [address]     PLAN OF CARE and INTERVENTIONS:             GOALS OF CARE/ ADVANCE CARE PLANNING:  Goal is to place patient in a facility. Patient has a MOST form. SOCIAL/EMOTIONAL/SPIRITUAL ASSESSMENT/ INTERVENTIONS:  SW and RN- M. Dimas Aguas arrived for a visit with patient and her sister/PCG-Norma Ayers at her home. Education regarding palliative  care role in patient's care, contact information and visit frequency was provided to PCG-Norma Ayers. Norma Ayers provided verbal consent to palliative care services. Patient has advanced dementia, is hard of hearing and is unable to participate in conversation. Her verbalizations are nonsensical, she was observed laughing out loud and mumbling, kissing and cradling a stuffed animal. Her [name] advised that patient's condition appears to be declining where she is needing more care and she is concern about her safety. Norma Ayers reported that she has been caring for patient since 2014 when patient's husband passed away. Patient has a history of alcohol abuse, but is currently sober. She is having increased incontinent episodes. Patient wanders, disrobes outside and is having intermittent agitation in the evening due to sundowning. Patient ambulates independently. She is currently live in her home with daily contact from family members who live on same street as patient. Norma Ayers desires assistance with placing patient as she needs more care than she and her sibling can provide and they are concerned about her safety. SW provided education to [facility] on placement process and SW will start the process by requesting an FL2 be completed by patient's PCP. PCG is interested in  patient being placed at [facility] as it is close to their home and visiting will be easier. SW provided education, assessment of patient needs and comfort, supportive presence, active and reflective listening, normalized feelings and reassurance of support. PCG remains open to ongoing support from palliative care RN/SW team. PATIENT/CAREGIVER EDUCATION/ COPING:  PCG is overwhelmed with patient care needs and is experiencing caregiver fatigue. She desire assistance with placing patient in a memory care facility.  PERSONAL EMERGENCY PLAN:  911 can be activated for emergencies.  COMMUNITY RESOURCES COORDINATION/ HEALTH CARE NAVIGATION:  None. PCG request that patient be placed.  FINANCIAL/LEGAL CONCERNS/INTERVENTIONS:  None that would interfere with placement.      SOCIAL HX:  Social History   Tobacco Use   Smoking status: Never   Smokeless tobacco: Never  Substance Use Topics   Alcohol use: Yes    Comment: anything she can get her hands on    CODE STATUS: To be assessed ADVANCED DIRECTIVES: No MOST FORM COMPLETE:  Yes HOSPICE EDUCATION PROVIDED: NO  PPS: Patient has cognitive deficits, but is ambulatory. She wanders and family is concerned regarding safety.    Duration of visit and documentation: 60 minutes.  [address] [employer], [place]

## 2021-07-03 NOTE — Progress Notes (Signed)
[facility] COMMUNITY PALLIATIVE CARE RN NOTE  PATIENT NAME: Norma Ayers DOB: 1951-08-18 MRN: [id number]  PRIMARY CARE PROVIDER: Aletha Ayers., PA-C  RESPONSIBLE PARTY: Norma Ayers (sister) Acct ID - Guarantor Home Phone Work Phone Relationship Acct Type  [id number] SECILIA, [phone]  Self P/F     [address], Norma Ayers, [place] [address]   Covid-19 Pre-screening Negative  PLAN OF CARE and INTERVENTION:  ADVANCE CARE PLANNING/GOALS OF CARE: Goal is for patient to be placed in a facility for safety. PATIENT/CAREGIVER EDUCATION: Explained palliative care services, symptom management DISEASE STATUS: Joint initial visit completed with LCSW, Norma Ayers. Met with patient and her sister, Norma Ayers, in the home. Patient is sitting on the couch throughout visit. Her speech is mainly nonsensical and unintelligible. She is hard of hearing. Her mood is pleasant and she was hugging and kissing a stuff animal that was recently purchased for her. 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 Patient has a history of alcohol abuse, but is currently sober. She is ambulatory and able to feed herself, but is continuously confused. Symptom seems to worsen in the evenings/night. Norma Ayers found patient one day wandering outside in her underwear. She requires assistance with bathing, dressing and toileting. She often refuses care. She is having increased episodes of urinary incontinence and now wears depends. Her appetite is good. No issues with dysphagia. She says her doctor feels that patient requires 24/7 care and Norma Ayers is seeking assistance with placing patient in a facility. Norma Ayers and other family members who live on the same street, checks on patient frequently throughout the day. She had an ED visit on 05/20/21. Norma Ayers noticed patient's eyes rolling to the back of her head and patient shaking lasting for about 3 minutes. Her CT scan and labwork was unremarkable. It was suspected that  patient was experiencing alcohol withdrawal. Ativan 1 mg was given. She was then discharged back to her home. No further seizure-like activity has been observed or reported. Norma Ayers is agreeable to future visits with palliative care. Consent signed.  HISTORY OF PRESENT ILLNESS:  This is a 70 yo female with a diagnosis of vascular dementia. 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 Palliative care team has been asked to follow patient to assist with symptom management, goals of care and complex decision making.  CODE STATUS: Full code ADVANCED DIRECTIVES: Y MOST FORM: yes PPS: 50%   PHYSICAL EXAM:   VITALS: Today's Vitals   06/08/21 1459  BP: 119/78  Pulse: 69  Resp: 16  Temp: 97.6 F (36.4 C)  TempSrc: Temporal  SpO2: 96%  PainSc: 0-No pain    LUNGS: clear to auscultation  CARDIAC: Cor RRR EXTREMITIES: bilateral lower extremity edema SKIN:  Exposed skin is dry and intact   NEURO:  Continuously confused, pleasant, unintelligible speech, hard of hearing, ambulatory   (Duration of visit and documentation 60 minutes)   Daryl Eastern, RN BSN

## 2021-07-11 ENCOUNTER — Telehealth: Payer: Self-pay

## 2021-07-11 NOTE — Telephone Encounter (Signed)
07/11/21 @1 :30 PM: Palliative care SW outreached patients sister, , to touch base on placement needs for patient.  Call unsuccessful. SW LVM for sister to return call.

## 2021-07-11 NOTE — Progress Notes (Addendum)
CARE COORDINATION: PC SW spoke with and sent patients FL2 to the following facilities/communities in an attempt to assist in locating placement for patient.  [facility] [place]  [facility] memory care

## 2022-12-06 ENCOUNTER — Encounter (HOSPITAL_COMMUNITY): Payer: Self-pay | Admitting: Emergency Medicine

## 2022-12-06 ENCOUNTER — Emergency Department (HOSPITAL_COMMUNITY): Payer: Medicare Other

## 2022-12-06 ENCOUNTER — Emergency Department (HOSPITAL_COMMUNITY)
Admission: EM | Admit: 2022-12-06 | Discharge: 2022-12-07 | Disposition: A | Discharge status: Skilled Nursing Facility | Payer: Medicare Other | Attending: Emergency Medicine | Admitting: Emergency Medicine

## 2022-12-06 ENCOUNTER — Other Ambulatory Visit: Payer: Self-pay

## 2022-12-06 DIAGNOSIS — Z79899 Other long term (current) drug therapy: Secondary | ICD-10-CM | POA: Diagnosis not present

## 2022-12-06 DIAGNOSIS — Z96643 Presence of artificial hip joint, bilateral: Secondary | ICD-10-CM | POA: Insufficient documentation

## 2022-12-06 DIAGNOSIS — W19XXXA Unspecified fall, initial encounter: Secondary | ICD-10-CM | POA: Insufficient documentation

## 2022-12-06 DIAGNOSIS — Y92129 Unspecified place in nursing home as the place of occurrence of the external cause: Secondary | ICD-10-CM | POA: Insufficient documentation

## 2022-12-06 DIAGNOSIS — I10 Essential (primary) hypertension: Secondary | ICD-10-CM | POA: Insufficient documentation

## 2022-12-06 DIAGNOSIS — F0390 Unspecified dementia without behavioral disturbance: Secondary | ICD-10-CM | POA: Insufficient documentation

## 2022-12-06 DIAGNOSIS — R569 Unspecified convulsions: Secondary | ICD-10-CM | POA: Diagnosis present

## 2022-12-06 DIAGNOSIS — N3000 Acute cystitis without hematuria: Secondary | ICD-10-CM | POA: Insufficient documentation

## 2022-12-06 HISTORY — DX: Vascular dementia, unspecified severity, with other behavioral disturbance: F01.518

## 2022-12-06 LAB — CBC
HCT: 44.4 % (ref 36.0–46.0)
Hemoglobin: 13.4 g/dL (ref 12.0–15.0)
MCH: 26 pg (ref 26.0–34.0)
MCHC: 30.2 g/dL (ref 30.0–36.0)
MCV: 86 fL (ref 80.0–100.0)
Platelets: 190 10*3/uL (ref 150–400)
RBC: 5.16 MIL/uL — ABNORMAL HIGH (ref 3.87–5.11)
RDW: 14.3 % (ref 11.5–15.5)
WBC: 8.4 10*3/uL (ref 4.0–10.5)
nRBC: 0 % (ref 0.0–0.2)

## 2022-12-06 LAB — URINALYSIS, ROUTINE W REFLEX MICROSCOPIC
Bilirubin Urine: NEGATIVE
Glucose, UA: NEGATIVE mg/dL
Ketones, ur: NEGATIVE mg/dL
Nitrite: POSITIVE — AB
Protein, ur: NEGATIVE mg/dL
Specific Gravity, Urine: 1.018 (ref 1.005–1.030)
WBC, UA: >50 WBC/hpf (ref 0–5)
pH: 5 (ref 5.0–8.0)

## 2022-12-06 LAB — COMPREHENSIVE METABOLIC PANEL
ALT: 15 U/L (ref 0–44)
AST: 17 U/L (ref 15–41)
Albumin: 3.7 g/dL (ref 3.5–5.0)
Alkaline Phosphatase: 67 U/L (ref 38–126)
Anion gap: 10 (ref 5–15)
BUN: 24 mg/dL — ABNORMAL HIGH (ref 8–23)
CO2: 26 mmol/L (ref 22–32)
Calcium: 9 mg/dL (ref 8.9–10.3)
Chloride: 104 mmol/L (ref 98–111)
Creatinine, Ser: 0.98 mg/dL (ref 0.44–1.00)
GFR, Estimated: >60 mL/min (ref >60)
Glucose, Bld: 110 mg/dL — ABNORMAL HIGH (ref 70–99)
Potassium: 3.9 mmol/L (ref 3.5–5.1)
Sodium: 140 mmol/L (ref 135–145)
Total Bilirubin: 0.3 mg/dL (ref 0.3–1.2)
Total Protein: 7 g/dL (ref 6.5–8.1)

## 2022-12-06 MED ORDER — CEPHALEXIN 500 MG PO CAPS: 500.0000 mg | ORAL_CAPSULE | Freq: Four times a day (QID) | ORAL | 0 refills | Status: DC

## 2022-12-06 MED ORDER — STERILE WATER FOR INJECTION IJ SOLN
INTRAMUSCULAR | Status: AC
  Filled 2022-12-06: qty 10

## 2022-12-06 MED ORDER — CEFTRIAXONE SODIUM 1 G IJ SOLR
1.0000 g | Freq: Once | INTRAMUSCULAR | Status: AC
  Administered 2022-12-06: 1 g via INTRAMUSCULAR
  Filled 2022-12-06: qty 10

## 2022-12-06 NOTE — ED Notes (Addendum)
Nurse walked by room, noticed pt was sitting up and had scooted to end of stretcher, pt moved up in bed, explained to pt that she cannot get up as she is fall risk,  pt has dementia, unable to verbalize understanding, pt room in view of nurses station, unable to find alarm pad for bed alarm, body belt placed on pt, secured to bottom of stretcher- Dr Rogene Houston made aware.

## 2022-12-06 NOTE — ED Notes (Signed)
ED Provider at bedside. 

## 2022-12-06 NOTE — ED Notes (Signed)
Pt alert, yelling out  when someone touches her, in process of placing pt on monitor, seizure pads on stretcher.

## 2022-12-06 NOTE — ED Notes (Signed)
DNR paper, DC papers all given to EMS transport

## 2022-12-06 NOTE — ED Notes (Signed)
Pt arrived with urine/feces in brief- pt was cleaned up, peri care done, new brief placed on pt

## 2022-12-06 NOTE — Discharge Instructions (Addendum)
Patient's workup here head CT negative.  Labs without significant abnormalities.  No further seizure activity but if she has recurrent seizure activity will need to get seen again.  Urinalysis consistent with urinary tract infection urine culture sent.  Patient given IM Rocephin here.  Would recommend continuing IM Rocephin there or oral Keflex.  Prescription provided for the Keflex.  Return for any new or worse symptoms.

## 2022-12-06 NOTE — ED Notes (Signed)
BJ, RN from [facility] called to check on pt-explained to nurse that pt is ambulatory without assistive device on dementia unit, reports pt spilled drink and slipped on it and fell to floor, did not hit head, fell onto back, pt was then assisted to standing position, pt then had LOC and went limp, pt lowered to floor and placed on side. Pt had shaking to extremities lasting approx 2 minutes, pt did not stop breathing or loose pulses, however was not responsive for approx 10 minutes. Dr Rogene Houston made aware.

## 2022-12-06 NOTE — ED Notes (Signed)
Mits placed on pt as she continues to attempt to get out of bed and pulling at body belt trying to take it off.

## 2022-12-06 NOTE — ED Provider Notes (Addendum)
[facility] Provider Note   CSN: [id number] Arrival date & time: 12/06/22  2036     History  Chief Complaint  Patient presents with   Fall   Loss of Consciousness    ? seizure    Norma Ayers is a 72 y.o. female.  Patient sent in from [facility] dementia center by EMS.  Patient is normally able to walk because she spilled some liquid in a cup that she had and then had a mechanical fall.  Center said she did not hit her head.  They got her back up on her feet she went limp and apparently had brief period of seizure-like activity at most 2 minutes and then she was postictal for about 5.  Temp here 97.4 blood pressure 146/103 oxygen saturations are 100%.  Patient does not have a history of seizure disorder.  Patient is not on blood thinners.  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  Also significant for heavy alcohol use.  Never used tobacco products.  Patient is a DNR.       Home Medications Prior to Admission medications   Medication Sig Start Date End Date Taking? Authorizing Provider  cephALEXin (KEFLEX) 500 MG capsule Take 1 capsule (500 mg total) by mouth 4 (four) times daily. 12/06/22  Yes Fredia Sorrow, MD  atorvastatin (LIPITOR) 20 MG tablet Take 20 mg by mouth every evening. 12/05/17   [provider]  docusate sodium (COLACE) 100 MG capsule Take 1 capsule (100 mg total) by mouth 2 (two) times daily. 10/11/17   Swinteck, Aaron Edelman, MD  ENSURE (ENSURE) Give 60cc by mouth two times daily for nutritional supplement    [provider]  folic acid (FOLVITE) 1 MG tablet Take 1 tablet (1 mg total) by mouth daily. 09/13/17   Domenic Polite, MD  folic acid (FOLVITE) 1 MG tablet  Take 1 mg by mouth daily.    [provider]  losartan (COZAAR) 25 MG tablet Take 25 mg by mouth daily.    [provider]  naproxen sodium (ALEVE) 220 MG tablet Take 440 mg by mouth 2 (two) times daily.     [provider]  Nutritional Supplements (NUTRITIONAL SUPPLEMENT PO) NAS (No salt added) diet - Regular texture, regular consistency, heart healthy    [provider]  ondansetron (ZOFRAN) 4 MG tablet Take 1 tablet (4 mg total) by mouth every 6 (six) hours as needed for nausea. 10/11/17   Swinteck, Aaron Edelman, MD  QUEtiapine (SEROQUEL) 25 MG tablet Take 2 tablets (50 mg total) by mouth 2 (two) times daily. 06/05/21 07/05/21  Alric Ran, MD  senna (SENOKOT) 8.6 MG TABS tablet Take 2 tablets (17.2 mg total) by mouth at bedtime. 10/11/17   Swinteck, Aaron Edelman, MD  sertraline (ZOLOFT) 50 MG tablet Take 75 mg by mouth daily.    [provider]  thiamine 100 MG tablet Take 1 tablet (100 mg total) by mouth daily. 09/13/17   Domenic Polite, MD      Allergies    Patient has no known allergies.    Review of Systems   Review of Systems  Unable to perform ROS: Dementia    Physical Exam Updated Vital Signs BP (!) 171/87   Pulse 77   Temp (!) 97.4 F (36.3 C) (  Axillary)   Resp 20   Ht 1.549 m (5' 1"$ )   Wt 54.9 kg   SpO2 97%   BMI 22.87 kg/m  Physical Exam Vitals and nursing note reviewed.  Constitutional:      General: She is not in acute distress.    Appearance: Normal appearance. She is well-developed.  HENT:     Head: Normocephalic and atraumatic.     Mouth/Throat:     Mouth: Mucous membranes are moist.  Eyes:     Extraocular Movements: Extraocular movements intact.     Conjunctiva/sclera: Conjunctivae normal.     Pupils: Pupils are equal, round, and reactive to light.  Cardiovascular:     Rate and Rhythm: Normal rate and regular rhythm.     Heart sounds: No murmur heard. Pulmonary:     Effort: Pulmonary effort is normal. No respiratory  distress.     Breath sounds: Normal breath sounds.  Abdominal:     Palpations: Abdomen is soft.     Tenderness: There is no abdominal tenderness.  Musculoskeletal:        General: No swelling.     Cervical back: Normal range of motion and neck supple.     Right lower leg: No edema.     Left lower leg: No edema.  Skin:    General: Skin is warm and dry.     Capillary Refill: Capillary refill takes less than 2 seconds.  Neurological:     Mental Status: She is alert. Mental status is at baseline.     Comments: Moving all 4 extremities.  Psychiatric:        Mood and Affect: Mood normal.     ED Results / Procedures / Treatments   Labs (all labs ordered are listed, but only abnormal results are displayed) Labs Reviewed  URINALYSIS, ROUTINE W REFLEX MICROSCOPIC - Abnormal; Notable for the following components:      Result Value   APPearance CLOUDY (*)    Hgb urine dipstick SMALL (*)    Nitrite POSITIVE (*)    Leukocytes,Ua LARGE (*)    Bacteria, UA RARE (*)    All other components within normal limits  CBC - Abnormal; Notable for the following components:   RBC 5.16 (*)    All other components within normal limits  COMPREHENSIVE METABOLIC PANEL - Abnormal; Notable for the following components:   Glucose, Bld 110 (*)    BUN 24 (*)    All other components within normal limits  URINALYSIS, W/ REFLEX TO CULTURE (INFECTION SUSPECTED)    EKG EKG Interpretation  Date/Time:  Thursday December 06 2022 20:45:32 EST Ventricular Rate:  67 PR Interval:  149 QRS Duration: 75 QT Interval:  426 QTC Calculation: 450 R Axis:   75 Text Interpretation: Sinus rhythm Minimal ST elevation, inferior leads Baseline wander in lead(s) V3 Confirmed by Fredia Sorrow [phone]) on 12/06/2022 10:58:04 PM  Radiology CT Head Wo Contrast  Result Date: 12/06/2022 CLINICAL DATA:  Recent fall with headaches, initial encounter EXAM: CT HEAD WITHOUT CONTRAST TECHNIQUE: Contiguous axial images were obtained  from the base of the skull through the vertex without intravenous contrast. RADIATION DOSE REDUCTION: This exam was performed according to the departmental dose-optimization program which includes automated exposure control, adjustment of the mA and/or kV according to patient size and/or use of iterative reconstruction technique. COMPARISON:  05/20/2021 FINDINGS: Brain: No evidence of acute infarction, hemorrhage, hydrocephalus, extra-axial collection or mass lesion/mass effect. Chronic atrophic and ischemic changes are noted. No significant change  from the prior exam is noted. Vascular: No hyperdense vessel or unexpected calcification. Skull: Normal. Negative for fracture or focal lesion. Sinuses/Orbits: No acute finding. Other: None. IMPRESSION: Chronic atrophic and ischemic changes stable from the previous exam. Electronically Signed   By: Inez Catalina M.D.   On: 12/06/2022 21:56    Procedures Procedures    Medications Ordered in ED Medications  cefTRIAXone (ROCEPHIN) injection 1 g (has no administration in time range)    ED Course/ Medical Decision Making/ A&P                             Medical Decision Making Amount and/or Complexity of Data Reviewed Labs: ordered. Radiology: ordered.  Risk Prescription drug management.   No further seizure activity here.  The patient seems to be baseline.  Significant dementia combative at times.  Moving all 4 extremities.  No obvious hip fractures.  Plus patient also was up on her feet following the fall.  No obvious head trauma.  But had head CT because of the seizure activity and because of the fall.  That was negative.  Patient's labs white count normal hemoglobin normal complete metabolic panel blood sugar 110 electrolytes normal.  Renal function normal LFTs normal anion gap is normal urinalysis however appears to be consistent with urinary tract infection is cloudy large leukocytes white blood cells greater than 50, bacteria was rare.  Nitrite  positive.   Urine sent for culture.  Patient given IM Rocephin here will be continued on Keflex or they can do IM Rocephin at [facility].  Patient will need to return for any further seizure activity but I think patient stable for discharge back at this point.  Not clear whether this was true seizure maybe this was a syncopal episode.     Final Clinical Impression(s) / ED Diagnoses Final diagnoses:  Fall, initial encounter  Seizure-like activity ([place])  Acute cystitis without hematuria    Rx / DC Orders ED Discharge Orders          Ordered    cephALEXin (KEFLEX) 500 MG capsule  4 times daily        12/06/22 2302              Fredia Sorrow, MD 12/06/22 [id number]    Fredia Sorrow, MD 12/06/22 [phone]

## 2022-12-06 NOTE — ED Notes (Signed)
Pt taken to CT.

## 2022-12-06 NOTE — ED Triage Notes (Signed)
Pt BIB [facility] from [facility]. Pt had mechanical fall tonight after slipping on liquid in the hallway. Staff was helping her up from the fall, when pt became limp and began to have seizure like activity for approx 2 mins. Pt alert at baseline on EMS arrival to facility.  Pt has dementia at baseline and DNR at bedside.

## 2022-12-06 NOTE — ED Notes (Signed)
Pt scooted to edge of bed again, attempting to get out of bed again- Dr Rogene Houston made aware.

## 2022-12-06 NOTE — ED Notes (Signed)
Cleaned pt after arriving that had a BM and full of urine. Pt placed in a clean brief and purewick at this time.

## 2022-12-06 NOTE — ED Notes (Signed)
Patient transported to CT via stretcher.

## 2022-12-07 NOTE — ED Notes (Signed)
EMS transport at bedside

## 2022-12-09 LAB — URINE CULTURE: Culture: >=100000 — AB

## 2022-12-10 ENCOUNTER — Telehealth (HOSPITAL_BASED_OUTPATIENT_CLINIC_OR_DEPARTMENT_OTHER): Payer: Self-pay

## 2022-12-10 NOTE — Telephone Encounter (Signed)
Post ED Visit - Positive Culture Follow-up  Culture report reviewed by antimicrobial stewardship pharmacist: [place] Team '[x]'$  Eliseo Gum, Pharm.D. '[]'$  Heide Guile, Pharm.D., BCPS AQ-ID '[]'$  Parks Neptune, Pharm.D., BCPS '[]'$  Alycia Rossetti, Pharm.D., BCPS '[]'$  [place], Pharm.D., BCPS, AAHIVP '[]'$  Legrand Como, Pharm.D., BCPS, AAHIVP '[]'$  Salome Arnt, PharmD, BCPS '[]'$  Johnnette Gourd, PharmD, BCPS '[]'$  Hughes Better, PharmD, BCPS '[]'$  Leeroy Cha, PharmD '[]'$  Laqueta Linden, PharmD, BCPS '[]'$  Albertina Parr, PharmD  [place] Team '[]'$  Leodis Sias, PharmD '[]'$  Lindell Spar, PharmD '[]'$  Royetta Asal, PharmD '[]'$  Graylin Shiver, Rph '[]'$  Rema Fendt) Glennon Mac, PharmD '[]'$  Arlyn Dunning, PharmD '[]'$  Netta Cedars, PharmD '[]'$  Dia Sitter, PharmD '[]'$  Leone Haven, PharmD '[]'$  Gretta Arab, PharmD '[]'$  Theodis Shove, PharmD '[]'$  Peggyann Juba, PharmD '[]'$  Reuel Boom, PharmD   Positive urine culture Treated with Cephalexin, organism sensitive to the same and no further patient follow-up is required at this time.  Glennon Hamilton 12/10/2022, 8:51 AM

## 2023-01-21 ENCOUNTER — Ambulatory Visit (INDEPENDENT_AMBULATORY_CARE_PROVIDER_SITE_OTHER): Payer: Medicare Other | Admitting: Neurology

## 2023-01-21 ENCOUNTER — Encounter: Payer: Self-pay | Admitting: Neurology

## 2023-01-21 VITALS — BP 156/81 | HR 59 | Ht 62.0 in | Wt 121.0 lb

## 2023-01-21 DIAGNOSIS — F01B11 Vascular dementia, moderate, with agitation: Secondary | ICD-10-CM | POA: Diagnosis not present

## 2023-01-21 DIAGNOSIS — R569 Unspecified convulsions: Secondary | ICD-10-CM

## 2023-01-21 MED ORDER — DIVALPROEX SODIUM 125 MG PO DR TAB: 250.0000 mg | DELAYED_RELEASE_TABLET | Freq: Three times a day (TID) | ORAL | 3 refills | Status: DC

## 2023-01-21 NOTE — Progress Notes (Signed)
[facility]  PATIENT: Norma Ayers DOB: 22-Dec-1950  REFERRING CLINICIAN: Dennard Nip, NP HISTORY FROM: [name] (sister) and Management consultant (Cousin)  REASON FOR VISIT: Follow up seizures, vascular dementia    HISTORICAL  CHIEF COMPLAINT:  Chief Complaint  Patient presents with   New Patient (Initial Visit)    Rm 13, sister peggy, and caregiver present from [facility] Seizures:last one was 12/06/22 went to [facility], she has had multiple throughout her lifertime and they all last anywhere from 3-5 mins and then she comes too   INTERVAL HISTORY 01/21/2023:  Patient presents today for follow-up, she is accompanied by her sister [name] and get a caregiver from [facility].  Last visit she has been admitted to a nursing facility with memory care. Caregiver has not been working with patient one-to-one.  Patient was recently seen in the hospital on February 24 after a fall.  Per chart review she spilled some water then slipped and fell.  When staff was assisting her, she went limp and had a convulsion.  She was taken to the hospital for seizure-like activity.  In the hospital she was found to have UTI and started on antibiotics.  Sister reported patient back to her baseline.  She completed her treatment.  They have not noted any additional seizures.   HISTORY OF PRESENT ILLNESS:  This is a 72 yo woman with history of global developmental delay, chronic alcoholism, depression anxiety who is presenting with sister [name] and cousin August Saucer for follow up after a seizure last week. Per sister, patient was sitting at the table, trying to fix her a sandwich, then slump over the chair with eyes rolled back, drooling, she looks like she passed out, no reported shaking, episode lasted 3 to 5 minutes.  Sister reported she snapped out of it when EMS arrived.  She was taken to the hospital where she had a Head CT which showed global atrophy and chronic microvascular disease.  She was  observed in the hospital and  family she was back to her baseline therefore she was discharged home with no follow-up.  In the hospital they were told that the seizure might be due to due to alcohol withdrawal.  Alcohol level was checked and it was less than 10.  Since being home no reported seizure or seizure-like activity.  Per family at baseline she cannot do anything for herself family helps her with her daily activity. They will give her meds, family helps her with meals, bathing and dressing.  They reported to symptoms are getting worse and currently she is having urinary incontinence.  It remains unclear how long the decline has been going on but for more than 3 years but worse in the past 10-months.  From family report, she has been less cooperative, she has regression in language,  she only answers by giggling and laughing but she cannot follow command.  At baseline at her best mental status the family reported patient cannot hold a conversation. She can answer with short one-word response but she has not been able to hold conversation.  She did go to school but went to [employer]. She lives in her house with both sisters living walking distance from her.  One of her sister will always give her food and give her medications. They are very concerned for her wellbeing, she has been falling more lately,  and not following command.  She continues to drink alcohol every single day.  It seems like  her son is the one providing the alcohol.  If she cannot find the alcohol in the house she is able to go to the store to get a beer.  The cousin August SaucerDean at bedside reports incidents where he  found her wandering down the street, he also found her sitting on the side way of the street.  Family is very concerned for her wellbeing.  At one point she was admitted in the nursing home but she was combative and they have to take her out of the  nursing home.    OTHER MEDICAL CONDITIONS: Cognitive developmental delay,  chronic alcoholism, Agitation, Depression, anxiety    REVIEW OF SYSTEMS: Unable to perform full ROS due to patient mental status  ALLERGIES: No Known Allergies  HOME MEDICATIONS: Outpatient Medications Prior to Visit  Medication Sig Dispense Refill   acetaminophen (TYLENOL) 325 MG tablet Take 650 mg by mouth every 6 (six) hours as needed for mild pain.     atorvastatin (LIPITOR) 20 MG tablet Take 20 mg by mouth every evening.     Emollient (EUCERIN) lotion Apply 1 Application topically as needed for dry skin.     folic acid (FOLVITE) 1 MG tablet Take 1 tablet (1 mg total) by mouth daily.     loratadine (CLARITIN) 10 MG tablet Take 10 mg by mouth daily.     losartan (COZAAR) 25 MG tablet Take 25 mg by mouth daily.     sertraline (ZOLOFT) 50 MG tablet Take 75 mg by mouth daily.     thiamine (VITAMIN B-1) 100 MG tablet Take 100 mg by mouth daily.     traMADol (ULTRAM) 50 MG tablet Take 50 mg by mouth every 8 (eight) hours as needed for severe pain.     traZODone (DESYREL) 50 MG tablet Take 25 mg by mouth at bedtime.     divalproex (DEPAKOTE) 125 MG DR tablet Take 125 mg by mouth 2 (two) times daily.     cephALEXin (KEFLEX) 500 MG capsule Take 1 capsule (500 mg total) by mouth 4 (four) times daily. 28 capsule 0   docusate sodium (COLACE) 100 MG capsule Take 1 capsule (100 mg total) by mouth 2 (two) times daily. 10 capsule 0   ENSURE (ENSURE) Give 60cc by mouth two times daily for nutritional supplement     folic acid (FOLVITE) 1 MG tablet Take 1 mg by mouth daily.     naproxen sodium (ALEVE) 220 MG tablet Take 440 mg by mouth 2 (two) times daily.      Nutritional Supplements (NUTRITIONAL SUPPLEMENT PO) NAS (No salt added) diet - Regular texture, regular consistency, heart healthy     ondansetron (ZOFRAN) 4 MG tablet Take 1 tablet (4 mg total) by mouth every 6 (six) hours as needed for nausea. 20 tablet 0   QUEtiapine (SEROQUEL) 25 MG tablet Take 2 tablets (50 mg total) by mouth 2 (two) times  daily. 120 tablet 3   senna (SENOKOT) 8.6 MG TABS tablet Take 2 tablets (17.2 mg total) by mouth at bedtime. 120 each 0   thiamine 100 MG tablet Take 1 tablet (100 mg total) by mouth daily.     No facility-administered medications prior to visit.    PAST MEDICAL HISTORY: Past Medical History:  Diagnosis Date   Alcoholism 09/08/2017   Anemia    Avascular necrosis of bones of both hips 09/08/2017   Cognitive developmental delay 09/08/2017   Hyperlipidemia    Hypertension    Right orbit fracture 09/08/2017  Tongue lesion 09/08/2017   Vascular dementia with behavioral disturbance     PAST SURGICAL HISTORY: Past Surgical History:  Procedure Laterality Date   gum tumor     removed   TOTAL HIP ARTHROPLASTY Right 10/10/2017   Procedure: RIGHT TOTAL HIP ARTHROPLASTY ANTERIOR APPROACH;  Surgeon: Samson Frederic, MD;  Location: [facility];  Service: Orthopedics;  Laterality: Right;  Needs RNFA   TOTAL HIP ARTHROPLASTY Left 11/21/2017   Procedure: LEFT TOTAL HIP ARTHROPLASTY ANTERIOR APPROACH;  Surgeon: Samson Frederic, MD;  Location: [facility];  Service: Orthopedics;  Laterality: Left;    FAMILY HISTORY: Family History  Problem Relation Age of Onset   Emphysema Mother    Hypertension Sister    Heart attack Brother     SOCIAL HISTORY: Social History   Socioeconomic History   Marital status: Widowed    Spouse name: Not on file   Number of children: Not on file   Years of education: Not on file   Highest education level: Not on file  Occupational History   Not on file  Tobacco Use   Smoking status: Never   Smokeless tobacco: Never  Vaping Use   Vaping Use: Never used  Substance and Sexual Activity   Alcohol use: Not Currently    Comment: anything she can get her hands on   Drug use: No   Sexual activity: Not on file  Other Topics Concern   Not on file  Social History Narrative   Lives alone   Right Handed   Drinks coffee, unsure of amount   Unable to answer for herself.    Social Determinants of Health   Financial Resource Strain: Not on file  Food Insecurity: Not on file  Transportation Needs: Not on file  Physical Activity: Not on file  Stress: Not on file  Social Connections: Not on file  Intimate Partner Violence: Not on file     PHYSICAL EXAM   GENERAL EXAM/CONSTITUTIONAL: Vitals:  Vitals:   01/21/23 1035  BP: (!) 156/81  Pulse: (!) 59  Weight: 121 lb 0.5 oz (54.9 kg)  Height: 5\' 2"  (1.575 m)   Body mass index is 22.14 kg/m. Wt Readings from Last 3 Encounters:  01/21/23 121 lb 0.5 oz (54.9 kg)  12/06/22 121 lb 0.5 oz (54.9 kg)  02/10/18 121 lb (54.9 kg)   Patient is in no distress; poor cooperation due to dementia. Limited physical examination.   EYES: Visual fields full to confrontation, Extraocular movements intacts, Able to track examiner   NEUROLOGIC: Unable to perform mental status exam, patient does not follow commands but she was able to mimic and give high 5's. Smiling. She has unintelligible speech. Move all 4 extremities at least antigravity. She is seating in a wheelchair.,    DIAGNOSTIC DATA (LABS, IMAGING, TESTING) - I reviewed patient records, labs, notes, testing and imaging myself where available.  Lab Results  Component Value Date   WBC 8.4 12/06/2022   HGB 13.4 12/06/2022   HCT 44.4 12/06/2022   MCV 86.0 12/06/2022   PLT 190 12/06/2022      Component Value Date/Time   NA 140 12/06/2022 2130   NA 141 10/03/2017 0000   K 3.9 12/06/2022 2130   CL 104 12/06/2022 2130   CO2 26 12/06/2022 2130   GLUCOSE 110 (H) 12/06/2022 2130   BUN 24 (H) 12/06/2022 2130   BUN 9 10/03/2017 0000   CREATININE 0.98 12/06/2022 2130   CALCIUM 9.0 12/06/2022 2130   PROT 7.0 12/06/2022  2130   ALBUMIN 3.7 12/06/2022 2130   AST 17 12/06/2022 2130   ALT 15 12/06/2022 2130   ALKPHOS 67 12/06/2022 2130   BILITOT 0.3 12/06/2022 2130   GFRNONAA >60 12/06/2022 2130   GFRAA >60 11/22/2017 0624   Lab Results  Component Value  Date   CHOL 219 (A) 12/03/2017   HDL 41 12/03/2017   LDLCALC 152 12/03/2017   TRIG 129 12/03/2017   No results found for: "HGBA1C" Lab Results  Component Value Date   VITAMINB12 406 09/17/2017   Lab Results  Component Value Date   TSH 1.378 09/07/2017    Head CT 05/20/2021: Atrophy, chronic microvascular disease. No acute intracranial abnormality.    ASSESSMENT AND PLAN  72 y.o. year old female with global developmental delay, chronic alcoholism, depression anxiety who is presenting for follow-up for breakthrough seizure in the setting of UTI.  Due to her dementia with agitation, will recommend to increase the Depakote to 250 3 times daily.  This will help with her seizures and also with the behavior. Continue your other medications and follow-up with a year or sooner if worse.    1. Moderate vascular dementia with agitation   2. Seizures      Patient Instructions  Increase Depakote to 250 mg 3 times daily Continue other medication Follow-up in 1 year or sooner if worse   No orders of the defined types were placed in this encounter.    Meds ordered this encounter  Medications   divalproex (DEPAKOTE) 125 MG DR tablet    Sig: Take 2 tablets (250 mg total) by mouth 3 (three) times daily.    Dispense:  540 tablet    Refill:  3    Return in about 1 year (around 01/21/2024).    Windell Norfolk, MD 01/21/2023, 12:40 PM  [facility] [address] [place], [place] [address] [phone]

## 2023-01-21 NOTE — Patient Instructions (Signed)
Increase Depakote to 250 mg 3 times daily Continue other medication Follow-up in 1 year or sooner if worse

## 2023-01-30 ENCOUNTER — Inpatient Hospital Stay (HOSPITAL_COMMUNITY)
Admission: EM | Admit: 2023-01-30 | Discharge: 2023-02-02 | DRG: 871 | Disposition: A | Discharge status: Skilled Nursing Facility | Payer: Medicare Other | Source: Skilled Nursing Facility | Attending: Family Medicine | Admitting: Family Medicine

## 2023-01-30 ENCOUNTER — Other Ambulatory Visit: Payer: Self-pay

## 2023-01-30 ENCOUNTER — Emergency Department (HOSPITAL_COMMUNITY): Payer: Medicare Other

## 2023-01-30 ENCOUNTER — Encounter (HOSPITAL_COMMUNITY): Payer: Self-pay

## 2023-01-30 DIAGNOSIS — F0153 Vascular dementia, unspecified severity, with mood disturbance: Secondary | ICD-10-CM | POA: Diagnosis present

## 2023-01-30 DIAGNOSIS — Z1152 Encounter for screening for COVID-19: Secondary | ICD-10-CM

## 2023-01-30 DIAGNOSIS — R6520 Severe sepsis without septic shock: Secondary | ICD-10-CM | POA: Diagnosis present

## 2023-01-30 DIAGNOSIS — R509 Fever, unspecified: Secondary | ICD-10-CM

## 2023-01-30 DIAGNOSIS — Z66 Do not resuscitate: Secondary | ICD-10-CM | POA: Diagnosis present

## 2023-01-30 DIAGNOSIS — G9341 Metabolic encephalopathy: Secondary | ICD-10-CM | POA: Diagnosis present

## 2023-01-30 DIAGNOSIS — F0154 Vascular dementia, unspecified severity, with anxiety: Secondary | ICD-10-CM | POA: Diagnosis present

## 2023-01-30 DIAGNOSIS — Z79899 Other long term (current) drug therapy: Secondary | ICD-10-CM

## 2023-01-30 DIAGNOSIS — I10 Essential (primary) hypertension: Secondary | ICD-10-CM | POA: Diagnosis present

## 2023-01-30 DIAGNOSIS — F1011 Alcohol abuse, in remission: Secondary | ICD-10-CM | POA: Diagnosis present

## 2023-01-30 DIAGNOSIS — Z8249 Family history of ischemic heart disease and other diseases of the circulatory system: Secondary | ICD-10-CM

## 2023-01-30 DIAGNOSIS — R6510 Systemic inflammatory response syndrome (SIRS) of non-infectious origin without acute organ dysfunction: Secondary | ICD-10-CM | POA: Diagnosis not present

## 2023-01-30 DIAGNOSIS — A419 Sepsis, unspecified organism: Principal | ICD-10-CM | POA: Diagnosis present

## 2023-01-30 DIAGNOSIS — J690 Pneumonitis due to inhalation of food and vomit: Secondary | ICD-10-CM | POA: Diagnosis present

## 2023-01-30 DIAGNOSIS — F88 Other disorders of psychological development: Secondary | ICD-10-CM | POA: Diagnosis present

## 2023-01-30 DIAGNOSIS — Z96643 Presence of artificial hip joint, bilateral: Secondary | ICD-10-CM | POA: Diagnosis present

## 2023-01-30 DIAGNOSIS — A0811 Acute gastroenteropathy due to Norwalk agent: Secondary | ICD-10-CM | POA: Diagnosis present

## 2023-01-30 DIAGNOSIS — F32A Depression, unspecified: Secondary | ICD-10-CM | POA: Diagnosis present

## 2023-01-30 DIAGNOSIS — E785 Hyperlipidemia, unspecified: Secondary | ICD-10-CM | POA: Diagnosis present

## 2023-01-30 LAB — APTT: aPTT: 30 seconds (ref 24–36)

## 2023-01-30 LAB — CBC WITH DIFFERENTIAL/PLATELET
Abs Immature Granulocytes: 0.02 10*3/uL (ref 0.00–0.07)
Basophils Absolute: 0 10*3/uL (ref 0.0–0.1)
Basophils Relative: 0 %
Eosinophils Absolute: 0 10*3/uL (ref 0.0–0.5)
Eosinophils Relative: 0 %
HCT: 43.1 % (ref 36.0–46.0)
Hemoglobin: 13.5 g/dL (ref 12.0–15.0)
Immature Granulocytes: 0 %
Lymphocytes Relative: 6 %
Lymphs Abs: 0.5 10*3/uL — ABNORMAL LOW (ref 0.7–4.0)
MCH: 26.4 pg (ref 26.0–34.0)
MCHC: 31.3 g/dL (ref 30.0–36.0)
MCV: 84.3 fL (ref 80.0–100.0)
Monocytes Absolute: 0.6 10*3/uL (ref 0.1–1.0)
Monocytes Relative: 7 %
Neutro Abs: 7 10*3/uL (ref 1.7–7.7)
Neutrophils Relative %: 87 %
Platelets: 174 10*3/uL (ref 150–400)
RBC: 5.11 MIL/uL (ref 3.87–5.11)
RDW: 14.6 % (ref 11.5–15.5)
WBC: 8.1 10*3/uL (ref 4.0–10.5)
nRBC: 0 % (ref 0.0–0.2)

## 2023-01-30 LAB — COMPREHENSIVE METABOLIC PANEL
ALT: 25 U/L (ref 0–44)
AST: 32 U/L (ref 15–41)
Albumin: 3.8 g/dL (ref 3.5–5.0)
Alkaline Phosphatase: 54 U/L (ref 38–126)
Anion gap: 12 (ref 5–15)
BUN: 29 mg/dL — ABNORMAL HIGH (ref 8–23)
CO2: 24 mmol/L (ref 22–32)
Calcium: 9 mg/dL (ref 8.9–10.3)
Chloride: 101 mmol/L (ref 98–111)
Creatinine, Ser: 1.06 mg/dL — ABNORMAL HIGH (ref 0.44–1.00)
GFR, Estimated: 55 mL/min — ABNORMAL LOW (ref >60)
Glucose, Bld: 168 mg/dL — ABNORMAL HIGH (ref 70–99)
Potassium: 3.4 mmol/L — ABNORMAL LOW (ref 3.5–5.1)
Sodium: 137 mmol/L (ref 135–145)
Total Bilirubin: 0.7 mg/dL (ref 0.3–1.2)
Total Protein: 7.1 g/dL (ref 6.5–8.1)

## 2023-01-30 LAB — RESP PANEL BY RT-PCR (RSV, FLU A&B, COVID)  RVPGX2
Influenza A by PCR: NEGATIVE
Influenza B by PCR: NEGATIVE
Resp Syncytial Virus by PCR: NEGATIVE
SARS Coronavirus 2 by RT PCR: NEGATIVE

## 2023-01-30 LAB — CULTURE, BLOOD (ROUTINE X 2)

## 2023-01-30 LAB — URINALYSIS, W/ REFLEX TO CULTURE (INFECTION SUSPECTED)
Bilirubin Urine: NEGATIVE
Glucose, UA: NEGATIVE mg/dL
Ketones, ur: NEGATIVE mg/dL
Leukocytes,Ua: NEGATIVE
Nitrite: NEGATIVE
Protein, ur: NEGATIVE mg/dL
Specific Gravity, Urine: 1.02 (ref 1.005–1.030)
pH: 5 (ref 5.0–8.0)

## 2023-01-30 LAB — PROTIME-INR
INR: 1.1 (ref 0.8–1.2)
Prothrombin Time: 14.5 seconds (ref 11.4–15.2)

## 2023-01-30 LAB — LIPASE, BLOOD: Lipase: 32 U/L (ref 11–51)

## 2023-01-30 LAB — LACTIC ACID, PLASMA
Lactic Acid, Venous: 2.3 mmol/L (ref 0.5–1.9)
Lactic Acid, Venous: 2.4 mmol/L (ref 0.5–1.9)

## 2023-01-30 MED ORDER — DIVALPROEX SODIUM 125 MG PO CSDR
250.0000 mg | DELAYED_RELEASE_CAPSULE | Freq: Three times a day (TID) | ORAL | Status: DC
  Administered 2023-01-30 – 2023-02-02 (×8): 250 mg via ORAL
  Filled 2023-01-30 (×8): qty 2

## 2023-01-30 MED ORDER — SODIUM CHLORIDE 0.9% FLUSH
3.0000 mL | Freq: Two times a day (BID) | INTRAVENOUS | Status: DC
  Administered 2023-01-30 – 2023-02-02 (×4): 3 mL via INTRAVENOUS

## 2023-01-30 MED ORDER — ONDANSETRON HCL 4 MG PO TABS: 4.0000 mg | ORAL_TABLET | Freq: Four times a day (QID) | ORAL | Status: DC | PRN

## 2023-01-30 MED ORDER — LACTATED RINGERS IV SOLN: INTRAVENOUS | Status: AC

## 2023-01-30 MED ORDER — SODIUM CHLORIDE 0.9 % IV SOLN: INTRAVENOUS | Status: DC | PRN

## 2023-01-30 MED ORDER — LACTATED RINGERS IV BOLUS (SEPSIS)
1000.0000 mL | Freq: Once | INTRAVENOUS | Status: AC
  Administered 2023-01-30: 1000 mL via INTRAVENOUS

## 2023-01-30 MED ORDER — SODIUM CHLORIDE 0.9% FLUSH
3.0000 mL | Freq: Two times a day (BID) | INTRAVENOUS | Status: DC
  Administered 2023-01-30 – 2023-02-02 (×6): 3 mL via INTRAVENOUS

## 2023-01-30 MED ORDER — SODIUM CHLORIDE 0.9 % IV SOLN: INTRAVENOUS | Status: DC

## 2023-01-30 MED ORDER — SODIUM CHLORIDE 0.9 % IV SOLN
2.0000 g | Freq: Once | INTRAVENOUS | Status: AC
  Administered 2023-01-30: 2 g via INTRAVENOUS
  Filled 2023-01-30: qty 12.5

## 2023-01-30 MED ORDER — ATORVASTATIN CALCIUM 20 MG PO TABS
20.0000 mg | ORAL_TABLET | Freq: Every evening | ORAL | Status: DC
  Administered 2023-01-30 – 2023-02-01 (×3): 20 mg via ORAL
  Filled 2023-01-30 (×3): qty 1

## 2023-01-30 MED ORDER — TRAZODONE HCL 50 MG PO TABS
50.0000 mg | ORAL_TABLET | Freq: Every evening | ORAL | Status: DC | PRN
  Administered 2023-02-01: 50 mg via ORAL
  Filled 2023-01-30: qty 1

## 2023-01-30 MED ORDER — ONDANSETRON HCL 4 MG/2ML IJ SOLN
4.0000 mg | Freq: Four times a day (QID) | INTRAMUSCULAR | Status: DC | PRN
  Administered 2023-02-01: 4 mg via INTRAVENOUS
  Filled 2023-01-30: qty 2

## 2023-01-30 MED ORDER — METRONIDAZOLE 500 MG/100ML IV SOLN
500.0000 mg | Freq: Once | INTRAVENOUS | Status: AC
  Administered 2023-01-30: 500 mg via INTRAVENOUS
  Filled 2023-01-30: qty 100

## 2023-01-30 MED ORDER — ACETAMINOPHEN 650 MG RE SUPP
650.0000 mg | Freq: Once | RECTAL | Status: AC
  Administered 2023-01-30: 650 mg via RECTAL
  Filled 2023-01-30: qty 1

## 2023-01-30 MED ORDER — FOLIC ACID 1 MG PO TABS
1.0000 mg | ORAL_TABLET | Freq: Every day | ORAL | Status: DC
  Administered 2023-01-30 – 2023-02-02 (×4): 1 mg via ORAL
  Filled 2023-01-30 (×4): qty 1

## 2023-01-30 MED ORDER — VANCOMYCIN HCL 750 MG/150ML IV SOLN
750.0000 mg | INTRAVENOUS | Status: DC
  Administered 2023-01-31: 750 mg via INTRAVENOUS
  Filled 2023-01-30 (×2): qty 150

## 2023-01-30 MED ORDER — ACETAMINOPHEN 650 MG RE SUPP: 650.0000 mg | Freq: Four times a day (QID) | RECTAL | Status: DC | PRN

## 2023-01-30 MED ORDER — VANCOMYCIN HCL IN DEXTROSE 1-5 GM/200ML-% IV SOLN
1000.0000 mg | Freq: Once | INTRAVENOUS | Status: AC
  Administered 2023-01-30: 1000 mg via INTRAVENOUS
  Filled 2023-01-30: qty 200

## 2023-01-30 MED ORDER — THIAMINE MONONITRATE 100 MG PO TABS
100.0000 mg | ORAL_TABLET | Freq: Every day | ORAL | Status: DC
  Administered 2023-01-30 – 2023-02-02 (×4): 100 mg via ORAL
  Filled 2023-01-30 (×4): qty 1

## 2023-01-30 MED ORDER — POLYETHYLENE GLYCOL 3350 17 G PO PACK: 17.0000 g | PACK | Freq: Every day | ORAL | Status: DC | PRN

## 2023-01-30 MED ORDER — SODIUM CHLORIDE 0.9 % IV SOLN
2.0000 g | Freq: Two times a day (BID) | INTRAVENOUS | Status: DC
  Administered 2023-01-30 – 2023-02-02 (×6): 2 g via INTRAVENOUS
  Filled 2023-01-30 (×6): qty 12.5

## 2023-01-30 MED ORDER — LACTATED RINGERS IV BOLUS (SEPSIS)
250.0000 mL | Freq: Once | INTRAVENOUS | Status: AC
  Administered 2023-01-30: 250 mL via INTRAVENOUS

## 2023-01-30 MED ORDER — HEPARIN SODIUM (PORCINE) 5000 UNIT/ML IJ SOLN
5000.0000 [IU] | Freq: Three times a day (TID) | INTRAMUSCULAR | Status: DC
  Administered 2023-01-30 – 2023-02-02 (×9): 5000 [IU] via SUBCUTANEOUS
  Filled 2023-01-30 (×8): qty 1

## 2023-01-30 MED ORDER — HEPARIN SODIUM (PORCINE) 5000 UNIT/ML IJ SOLN: 5000.0000 [IU] | Freq: Three times a day (TID) | INTRAMUSCULAR | Status: DC

## 2023-01-30 MED ORDER — SERTRALINE HCL 50 MG PO TABS
25.0000 mg | ORAL_TABLET | Freq: Every day | ORAL | Status: DC
  Administered 2023-01-30 – 2023-02-01 (×3): 25 mg via ORAL
  Filled 2023-01-30 (×3): qty 1

## 2023-01-30 MED ORDER — ACETAMINOPHEN 325 MG PO TABS: 650.0000 mg | ORAL_TABLET | Freq: Four times a day (QID) | ORAL | Status: DC | PRN

## 2023-01-30 MED ORDER — LACTATED RINGERS IV BOLUS (SEPSIS)
500.0000 mL | Freq: Once | INTRAVENOUS | Status: AC
  Administered 2023-01-30: 500 mL via INTRAVENOUS

## 2023-01-30 MED ORDER — BISACODYL 10 MG RE SUPP: 10.0000 mg | Freq: Every day | RECTAL | Status: DC | PRN

## 2023-01-30 MED ORDER — SODIUM CHLORIDE 0.9% FLUSH: 3.0000 mL | INTRAVENOUS | Status: DC | PRN

## 2023-01-30 NOTE — ED Triage Notes (Signed)
Per EMS, Pt, from [facility], presents w/ emesis x2 days and possible AMS. Per facility staff, Pt is normally non-verbal and only groan.  Denies diarrhea.

## 2023-01-30 NOTE — ED Provider Notes (Signed)
[place] EMERGENCY DEPARTMENT AT [facility] Provider Note   CSN: [id number] Arrival date & time: 01/30/23  4403     History  Chief Complaint  Patient presents with   Emesis    Norma Ayers is a 72 y.o. female.   Emesis Associated symptoms: no fever         Norma Ayers is a 72 y.o. female with past medical history of hypertension, vascular dementia with behavioral disturbance cognitive developmental delay and anemia resides at a local rehab facility who presents to the Emergency Department accompanied by her sister who is POA.  Sister provides history, patient non verbal at baseline, only groans.   states that she was sick several days ago with episodes of vomiting and diarrhea.  States she was told by the facility that her sister's symptoms resolved yesterday.  She was contacted again this morning by the facility stating that the patient was vomiting again and now vomiting dark-colored emesis.  Recurrent emesis has been present for 2 days.  Concern for possible altered mental status.  Report from facility staff that patient is nonverbal at baseline and only groans.  Denies any recurrent diarrhea since earlier this week.  No reported fever at the facility   Home Medications Prior to Admission medications   Medication Sig Start Date End Date Taking? Authorizing Provider  acetaminophen (TYLENOL) 325 MG tablet Take 650 mg by mouth every 6 (six) hours as needed for mild pain.    [provider]  atorvastatin (LIPITOR) 20 MG tablet Take 20 mg by mouth every evening. 12/05/17   [provider]  divalproex (DEPAKOTE) 125 MG DR tablet Take 2 tablets (250 mg total) by mouth 3 (three) times daily. 01/21/23 01/16/24  Windell Norfolk, MD  Emollient (EUCERIN) lotion Apply 1 Application topically as needed for dry skin.    [provider]  folic acid (FOLVITE) 1 MG tablet Take 1 tablet (1 mg total) by mouth daily. 09/13/17   Zannie Cove, MD  loratadine  (CLARITIN) 10 MG tablet Take 10 mg by mouth daily.    [provider]  losartan (COZAAR) 25 MG tablet Take 25 mg by mouth daily.    [provider]  sertraline (ZOLOFT) 50 MG tablet Take 75 mg by mouth daily.    [provider]  thiamine (VITAMIN B-1) 100 MG tablet Take 100 mg by mouth daily.    [provider]  traMADol (ULTRAM) 50 MG tablet Take 50 mg by mouth every 8 (eight) hours as needed for severe pain.    [provider]  traZODone (DESYREL) 50 MG tablet Take 25 mg by mouth at bedtime.    [provider]      Allergies    Patient has no known allergies.    Review of Systems   Review of Systems  Unable to perform ROS: Patient nonverbal  Constitutional:  Negative for fever.  Gastrointestinal:  Positive for vomiting.    Physical Exam Updated Vital Signs BP 125/69   Pulse (!) 101   Temp (!) 104 F (40 C) (Rectal)   Resp (!) 28   Ht  (1.575 m)   Wt 54.9 kg   SpO2 91%   BMI 22.13 kg/m  Physical Exam Vitals and nursing note reviewed.  Constitutional:      Appearance: She is ill-appearing.  HENT:     Head: Atraumatic.     Mouth/Throat:     Mouth: Mucous membranes are dry.  Cardiovascular:  Rate and Rhythm: Regular rhythm. Tachycardia present.     Pulses: Normal pulses.  Pulmonary:     Effort: Pulmonary effort is normal.     Breath sounds: Normal breath sounds.  Chest:     Chest wall: No tenderness.  Abdominal:     Palpations: Abdomen is soft.     Tenderness: There is no abdominal tenderness.  Musculoskeletal:     Cervical back: No tenderness.     Right lower leg: No edema.     Left lower leg: No edema.  Skin:    General: Skin is warm.     Capillary Refill: Capillary refill takes less than 2 seconds.     Findings: No bruising, erythema or rash.     Comments: No skin changes of the back or buttocks  Neurological:     General: No focal deficit present.     Sensory: No sensory deficit.     Motor: No  weakness.     ED Results / Procedures / Treatments   Labs (all labs ordered are listed, but only abnormal results are displayed) Labs Reviewed  LACTIC ACID, PLASMA - Abnormal; Notable for the following components:      Result Value   Lactic Acid, Venous 2.3 (*)    All other components within normal limits  COMPREHENSIVE METABOLIC PANEL - Abnormal; Notable for the following components:   Potassium 3.4 (*)    Glucose, Bld 168 (*)    BUN 29 (*)    Creatinine, Ser 1.06 (*)    GFR, Estimated 55 (*)    All other components within normal limits  CBC WITH DIFFERENTIAL/PLATELET - Abnormal; Notable for the following components:   Lymphs Abs 0.5 (*)    All other components within normal limits  RESP PANEL BY RT-PCR (RSV, FLU A&B, COVID)  RVPGX2  CULTURE, BLOOD (ROUTINE X 2)  CULTURE, BLOOD (ROUTINE X 2)  PROTIME-INR  APTT  LIPASE, BLOOD  LACTIC ACID, PLASMA  URINALYSIS, W/ REFLEX TO CULTURE (INFECTION SUSPECTED)    EKG EKG Interpretation  Date/Time:  Wednesday January 30 2023 08:53:54 EDT Ventricular Rate:  99 PR Interval:  130 QRS Duration: 84 QT Interval:  355 QTC Calculation: 456 R Axis:   101 Text Interpretation: Sinus rhythm Right axis deviation artifact in multiple leads Confirmed by Benjiman Core [id number]) on 01/30/2023 9:36:16 AM  Radiology DG Chest Port 1 View  Result Date: 01/30/2023 CLINICAL DATA:  Emesis and possible altered mental status. EXAM: PORTABLE CHEST 1 VIEW COMPARISON:  Chest radiograph 09/07/2017 FINDINGS: The cardiomediastinal silhouette is stable. Mitral annular calcifications are noted. There is no focal consolidation or pulmonary edema. There is no pleural effusion or pneumothorax There is no acute osseous abnormality. IMPRESSION: No radiographic evidence of acute cardiopulmonary process. Electronically Signed   By: Lesia Hausen M.D.   On: 01/30/2023 10:17    Procedures Procedures    Medications Ordered in ED Medications  lactated ringers infusion  (has no administration in time range)  lactated ringers bolus 1,000 mL (has no administration in time range)    And  lactated ringers bolus 500 mL (has no administration in time range)    And  lactated ringers bolus 250 mL (has no administration in time range)  metroNIDAZOLE (FLAGYL) IVPB 500 mg (has no administration in time range)  ceFEPIme (MAXIPIME) 2 g in sodium chloride 0.9 % 100 mL IVPB (has no administration in time range)  vancomycin (VANCOCIN) IVPB 1000 mg/200 mL premix (has no administration in time range)  ED Course/ Medical Decision Making/ A&P                             Medical Decision Making Patient here by EMS from local SNF facility with likely sepsis.  Tachycardic and febrile with rectal temp of 104.  Tachypneic as well.  Mucous membranes appear dry.  Report of vomiting dark-colored emesis x 2 days.  No diarrhea  Patient sister (Norma Ayers) is at bedside and is POA.  I have discussed DNR status with sister and she would like comfort measures to include necessary medications and IVF's as needed without use of mechanical ventilation  Patient has DNR  Patient's presentation includes high risk of morbidity, she appears acutely ill.  Sepsis protocol initiated with fluids, blood cultures, urine and urine cultures chest x-ray and EKG  Spoke with Meyer Cory, nurse at [facility].  States pt had vomiting and diarrhea on Monday seem to improve yesterday was eating and up walking around.  Woke this morning at 3 AM with vomiting dark Crisci emesis.  States nurses and other residents of the unit all have similar symptoms.  Amount and/or Complexity of Data Reviewed Labs: ordered.    Details: Labs interpreted by me, no evidence of leukocytosis, hemoglobin unremarkable.  Chemistries show blood sugar 168 BUN slightly elevated at 29 serum creatinine 1.06.  Lipase and respiratory panel unremarkable.  Initial lactic acid elevated 2.3.  I suspect this is secondary to  dehydration. Radiology: ordered.    Details: Chest x-ray without evidence of acute cardiopulmonary process ECG/medicine tests: ordered.    Details: EKG shows sinus rhythm with right axis deviation Discussion of management or test interpretation with external provider(s): Patient remains lethargic, appears to be resting comfortably.  Fever improved after rectal Tylenol.  Has bouts of hypotension despite fluid resuscitation.  Unable to arouse to try oral fluid challenge.  I suspect symptoms are viral.  I feel she would benefit from hospital observation and continued fluid resuscitation.  Discussed findings with [facility] hospitalist, Dr. Mariea Clonts who agrees to admit.  Risk OTC drugs. Prescription drug management. Decision regarding hospitalization.           Final Clinical Impression(s) / ED Diagnoses Final diagnoses:  SIRS (systemic inflammatory response syndrome)  Febrile illness    Rx / DC Orders ED Discharge Orders     None         Pauline Aus, PA-C 01/30/23 1429    Benjiman Core, MD 01/30/23 1538

## 2023-01-30 NOTE — Progress Notes (Signed)
Pharmacy Antibiotic Note  Norma Ayers is a 72 y.o. female admitted on 01/30/2023 with  altered mental status and rectal temp of 104 .  Pharmacy has been consulted for vancomycin and cefepime dosing.  White count normal. Renal function normal. Lactic acid of 2.3. Cultures sent.   Vancomycin 750 mg IV Q 24 hrs. Goal AUC 400-550. Expected AUC: 508 SCr used: 1.0  Plan: Vancomycin 750 IV every 24 hours.  Goal trough 15-20 mcg/mL. Cefepime 2g q 12 hours  Height:  (157.5 cm) Weight: 54.9 kg (121 lb) IBW/kg (Calculated) : 50.1  Temp (24hrs), Avg:104 F (40 C), Min:104 F (40 C), Max:104 F (40 C)  No results for input(s): "WBC", "CREATININE", "LATICACIDVEN", "VANCOTROUGH", "VANCOPEAK", "VANCORANDOM", "GENTTROUGH", "GENTPEAK", "GENTRANDOM", "TOBRATROUGH", "TOBRAPEAK", "TOBRARND", "AMIKACINPEAK", "AMIKACINTROU", "AMIKACIN" in the last 168 hours.  CrCl cannot be calculated (Patient's most recent lab result is older than the maximum 21 days allowed.).    No Known Allergies  Thank you for allowing pharmacy to be a part of this patient's care.  Sheppard Coil PharmD., BCPS Clinical Pharmacist 01/30/2023 9:26 AM

## 2023-01-30 NOTE — H&P (Incomplete)
Patient Demographics:    Norma Ayers, is a 72 y.o. female  MRN: [id number]   DOB - [birth year]  Admit Date - 01/30/2023  Outpatient Primary MD for the patient is [facility], [place]   Assessment & Plan:   Assessment and Plan:  1)SIRS--- fevers without identifiable etiology at this time -Panculture on antibiotics IV fluids as ordered  2) N./V/D since Monday   3)vascular dementia     Disposition/Need for in-Hospital Stay- patient unable to be discharged at this time due to ****  Status is: Inpatient  {Inpatient:23812}  Dispo: The patient is from: {From:23814}              Anticipated d/c is to: {To:23815}              Anticipated d/c date is: {Days:23816}              Patient currently {Medically stable:23817} Barriers: Not Clinically Stable- ***   With History of - Reviewed by me  Past Medical History:  Diagnosis Date   Alcoholism 09/08/2017   Anemia    Avascular necrosis of bones of both hips 09/08/2017   Cognitive developmental delay 09/08/2017   Hyperlipidemia    Hypertension    Right orbit fracture 09/08/2017   Tongue lesion 09/08/2017   Vascular dementia with behavioral disturbance       Past Surgical History:  Procedure Laterality Date   gum tumor     removed   TOTAL HIP ARTHROPLASTY Right 10/10/2017   Procedure: RIGHT TOTAL HIP ARTHROPLASTY ANTERIOR APPROACH;  Surgeon: Samson Frederic, MD;  Location: [facility];  Service: Orthopedics;  Laterality: Right;  Needs RNFA   TOTAL HIP ARTHROPLASTY Left 11/21/2017   Procedure: LEFT TOTAL HIP ARTHROPLASTY ANTERIOR APPROACH;  Surgeon: Samson Frederic, MD;  Location: [facility];  Service: Orthopedics;  Laterality: Left;      Chief Complaint  Patient presents with   Emesis      HPI:    Norma Ayers  is a 72 y.o. female with past medical history  relevant for global developmental delay, chronic alcoholism, depression anxiety and possible vascular dementia who presents from [facility] facility with concerns about being somewhat altered compared to baseline and is found to have fevers in the ED  -Patient is unable to provide any history  N./V/D since Monday ---mutiple staff and clients at Facility has GI bugs   Sister [name] at bedside     Review of systems:    In addition to the HPI above,   A full Review of  Systems was done, all other systems reviewed are negative except as noted above in HPI , .    Social History:  Reviewed by me    Social History   Tobacco Use   Smoking status: Never   Smokeless tobacco: Never  Substance Use Topics   Alcohol use: Not Currently    Comment: anything she can get her hands on  Family History :  Reviewed by me    Family History  Problem Relation Age of Onset   Emphysema Mother    Hypertension Sister    Heart attack Brother    ********   Home Medications:   Prior to Admission medications   Medication Sig Start Date End Date Taking? Authorizing Provider  acetaminophen (TYLENOL) 325 MG tablet Take 650 mg by mouth every 6 (six) hours as needed for mild pain.   Yes [provider]  atorvastatin (LIPITOR) 20 MG tablet Take 20 mg by mouth every evening. 12/05/17  Yes [provider]  divalproex (DEPAKOTE SPRINKLE) 125 MG capsule Take 250 mg by mouth 3 (three) times daily. 01/24/23  Yes [provider]  Emollient (EUCERIN) lotion Apply 1 Application topically as needed for dry skin.   Yes [provider]  folic acid (FOLVITE) 1 MG tablet Take 1 tablet (1 mg total) by mouth daily. 09/13/17  Yes Zannie Cove, MD  loratadine (CLARITIN) 10 MG tablet Take 10 mg by mouth daily.   Yes [provider]  losartan (COZAAR) 25 MG tablet Take 25 mg by mouth daily.   Yes [provider]  sertraline (ZOLOFT) 50 MG tablet Take 25 mg  by mouth daily.   Yes [provider]  thiamine (VITAMIN B-1) 100 MG tablet Take 100 mg by mouth daily.   Yes [provider]  traMADol (ULTRAM) 50 MG tablet Take 50 mg by mouth every 8 (eight) hours as needed for severe pain.   Yes [provider]  traZODone (DESYREL) 50 MG tablet Take 25 mg by mouth at bedtime.   Yes [provider]     Allergies:    No Known Allergies   Physical Exam:   Vitals  Blood pressure 98/67, pulse 77, temperature (!) 100.5 F (38.1 C), temperature source Rectal, resp. rate 16, height 5\' 2"  (1.575 m), weight 54.9 kg, SpO2 99 %.  Physical Examination: General appearance - alert,  in no distress and *** Mental status - alert, oriented to person, place, and time, *** Eyes - sclera anicteric Neck - supple, no JVD elevation , Chest - clear  to auscultation bilaterally, symmetrical air movement, *** Heart - S1 and S2 normal, regular *** Abdomen - soft, nontender, nondistended, +BS Neurological - screening mental status exam normal, neck supple without rigidity, cranial nerves II through XII intact, DTR's normal and symmetric Extremities - no pedal edema noted, intact peripheral pulses *** Skin - warm, dry     Data Review:    CBC Recent Labs  Lab 01/30/23 0945  WBC 8.1  HGB 13.5  HCT 43.1  PLT 174  MCV 84.3  MCH 26.4  MCHC 31.3  RDW 14.6  LYMPHSABS 0.5*  MONOABS 0.6  EOSABS 0.0  BASOSABS 0.0   ------------------------------------------------------------------------------------------------------------------  Chemistries  Recent Labs  Lab 01/30/23 0945  NA 137  K 3.4*  CL 101  CO2 24  GLUCOSE 168*  BUN 29*  CREATININE 1.06*  CALCIUM 9.0  AST 32  ALT 25  ALKPHOS 54  BILITOT 0.7   ------------------------------------------------------------------------------------------------------------------ estimated creatinine clearance is 37.4 mL/min (A) (by C-G formula based on SCr of 1.06 mg/dL  (H)). ------------------------------------------------------------------------------------------------------------------   Coagulation profile Recent Labs  Lab 01/30/23 0945  INR 1.1   -------------------------------------------------------------------------------------------------------------------  ---------------------------------------------------------------------------------------------------------------  Urinalysis    Component Value Date/Time   COLORURINE YELLOW 01/30/2023 1134   APPEARANCEUR CLEAR 01/30/2023 1134   LABSPEC 1.020 01/30/2023 1134   PHURINE 5.0 01/30/2023 1134  GLUCOSEU NEGATIVE 01/30/2023 1134   HGBUR SMALL (A) 01/30/2023 1134   BILIRUBINUR NEGATIVE 01/30/2023 1134   KETONESUR NEGATIVE 01/30/2023 1134   PROTEINUR NEGATIVE 01/30/2023 1134   NITRITE NEGATIVE 01/30/2023 1134   LEUKOCYTESUR NEGATIVE 01/30/2023 1134    ----------------------------------------------------------------------------------------------------------------   Imaging Results:    DG Chest Port 1 View  Result Date: 01/30/2023 CLINICAL DATA:  Emesis and possible altered mental status. EXAM: PORTABLE CHEST 1 VIEW COMPARISON:  Chest radiograph 09/07/2017 FINDINGS: The cardiomediastinal silhouette is stable. Mitral annular calcifications are noted. There is no focal consolidation or pulmonary edema. There is no pleural effusion or pneumothorax There is no acute osseous abnormality. IMPRESSION: No radiographic evidence of acute cardiopulmonary process. Electronically Signed   By: Lesia Hausen M.D.   On: 01/30/2023 10:17    Radiological Exams on Admission: DG Chest Port 1 View  Result Date: 01/30/2023 CLINICAL DATA:  Emesis and possible altered mental status. EXAM: PORTABLE CHEST 1 VIEW COMPARISON:  Chest radiograph 09/07/2017 FINDINGS: The cardiomediastinal silhouette is stable. Mitral annular calcifications are noted. There is no focal consolidation or pulmonary edema. There is no pleural  effusion or pneumothorax There is no acute osseous abnormality. IMPRESSION: No radiographic evidence of acute cardiopulmonary process. Electronically Signed   By: Lesia Hausen M.D.   On: 01/30/2023 10:17    DVT Prophylaxis -SCD/heparin AM Labs Ordered, also please review Full Orders  Family Communication: Admission, patients condition and plan of care including tests being ordered have been discussed with the patient awho indicate understanding and agree with the plan   Condition   -stable  Shon Hale M.D on 01/30/2023 at 5:01 PM Go to [email or web address] -  for contact info  [facility] - Office  [phone]

## 2023-01-30 NOTE — ED Notes (Signed)
Pt has not had any episodes of emesis or diarrhea since arrival.

## 2023-01-31 DIAGNOSIS — Z8249 Family history of ischemic heart disease and other diseases of the circulatory system: Secondary | ICD-10-CM | POA: Diagnosis not present

## 2023-01-31 DIAGNOSIS — I10 Essential (primary) hypertension: Secondary | ICD-10-CM | POA: Diagnosis present

## 2023-01-31 DIAGNOSIS — R6510 Systemic inflammatory response syndrome (SIRS) of non-infectious origin without acute organ dysfunction: Secondary | ICD-10-CM | POA: Diagnosis present

## 2023-01-31 DIAGNOSIS — F0153 Vascular dementia, unspecified severity, with mood disturbance: Secondary | ICD-10-CM | POA: Diagnosis present

## 2023-01-31 DIAGNOSIS — Z96643 Presence of artificial hip joint, bilateral: Secondary | ICD-10-CM | POA: Diagnosis present

## 2023-01-31 DIAGNOSIS — Z79899 Other long term (current) drug therapy: Secondary | ICD-10-CM | POA: Diagnosis not present

## 2023-01-31 DIAGNOSIS — G9341 Metabolic encephalopathy: Secondary | ICD-10-CM | POA: Diagnosis present

## 2023-01-31 DIAGNOSIS — R6520 Severe sepsis without septic shock: Secondary | ICD-10-CM | POA: Diagnosis present

## 2023-01-31 DIAGNOSIS — J690 Pneumonitis due to inhalation of food and vomit: Secondary | ICD-10-CM | POA: Diagnosis present

## 2023-01-31 DIAGNOSIS — Z66 Do not resuscitate: Secondary | ICD-10-CM | POA: Diagnosis present

## 2023-01-31 DIAGNOSIS — F88 Other disorders of psychological development: Secondary | ICD-10-CM | POA: Diagnosis present

## 2023-01-31 DIAGNOSIS — Z1152 Encounter for screening for COVID-19: Secondary | ICD-10-CM | POA: Diagnosis not present

## 2023-01-31 DIAGNOSIS — E785 Hyperlipidemia, unspecified: Secondary | ICD-10-CM | POA: Diagnosis present

## 2023-01-31 DIAGNOSIS — F32A Depression, unspecified: Secondary | ICD-10-CM | POA: Diagnosis present

## 2023-01-31 DIAGNOSIS — A0811 Acute gastroenteropathy due to Norwalk agent: Secondary | ICD-10-CM | POA: Diagnosis present

## 2023-01-31 DIAGNOSIS — A419 Sepsis, unspecified organism: Secondary | ICD-10-CM | POA: Diagnosis present

## 2023-01-31 DIAGNOSIS — F0154 Vascular dementia, unspecified severity, with anxiety: Secondary | ICD-10-CM | POA: Diagnosis present

## 2023-01-31 LAB — COMPREHENSIVE METABOLIC PANEL
ALT: 26 U/L (ref 0–44)
AST: 27 U/L (ref 15–41)
Albumin: 2.7 g/dL — ABNORMAL LOW (ref 3.5–5.0)
Alkaline Phosphatase: 41 U/L (ref 38–126)
Anion gap: 7 (ref 5–15)
BUN: 19 mg/dL (ref 8–23)
CO2: 24 mmol/L (ref 22–32)
Calcium: 7.8 mg/dL — ABNORMAL LOW (ref 8.9–10.3)
Chloride: 106 mmol/L (ref 98–111)
Creatinine, Ser: 0.82 mg/dL (ref 0.44–1.00)
GFR, Estimated: >60 mL/min (ref >60)
Glucose, Bld: 72 mg/dL (ref 70–99)
Potassium: 3 mmol/L — ABNORMAL LOW (ref 3.5–5.1)
Sodium: 137 mmol/L (ref 135–145)
Total Bilirubin: 0.5 mg/dL (ref 0.3–1.2)
Total Protein: 5.3 g/dL — ABNORMAL LOW (ref 6.5–8.1)

## 2023-01-31 LAB — CBC
HCT: 35.1 % — ABNORMAL LOW (ref 36.0–46.0)
Hemoglobin: 10.9 g/dL — ABNORMAL LOW (ref 12.0–15.0)
MCH: 26.5 pg (ref 26.0–34.0)
MCHC: 31.1 g/dL (ref 30.0–36.0)
MCV: 85.2 fL (ref 80.0–100.0)
Platelets: 129 10*3/uL — ABNORMAL LOW (ref 150–400)
RBC: 4.12 MIL/uL (ref 3.87–5.11)
RDW: 14.6 % (ref 11.5–15.5)
WBC: 12.1 10*3/uL — ABNORMAL HIGH (ref 4.0–10.5)
nRBC: 0 % (ref 0.0–0.2)

## 2023-01-31 LAB — CULTURE, BLOOD (ROUTINE X 2)

## 2023-01-31 LAB — MAGNESIUM: Magnesium: 1.3 mg/dL — ABNORMAL LOW (ref 1.7–2.4)

## 2023-01-31 MED ORDER — MAGNESIUM SULFATE 4 GM/100ML IV SOLN
4.0000 g | Freq: Once | INTRAVENOUS | Status: AC
  Administered 2023-01-31: 4 g via INTRAVENOUS
  Filled 2023-01-31: qty 100

## 2023-01-31 MED ORDER — ASPIRIN 81 MG PO TBEC
81.0000 mg | DELAYED_RELEASE_TABLET | Freq: Every day | ORAL | Status: DC
  Administered 2023-01-31 – 2023-02-02 (×3): 81 mg via ORAL
  Filled 2023-01-31 (×3): qty 1

## 2023-01-31 MED ORDER — POTASSIUM CHLORIDE CRYS ER 20 MEQ PO TBCR
40.0000 meq | EXTENDED_RELEASE_TABLET | ORAL | Status: AC
  Administered 2023-01-31 (×2): 40 meq via ORAL
  Filled 2023-01-31 (×2): qty 2

## 2023-01-31 NOTE — Progress Notes (Signed)
PROGRESS NOTE  Norma Ayers, is a 72 y.o. female, DOB - [birth year], ZOX:[id number]  Admit date - 01/30/2023   Admitting Physician Berenise Hunton Mariea Clonts, MD  Outpatient Primary MD for the patient is Orrick, [name]  LOS - 0  Chief Complaint  Patient presents with   Emesis      Brief Narrative:   72 y.o. female with past medical history relevant for global developmental delay, chronic alcoholism, depression anxiety and Vascular Dementia who presents from [facility] facility with concerns about being somewhat altered compared to baseline and is found to have fevers and SIRS  in the ED in the setting of nausea/vomiting and diarrhea.  -As per pt's Sister pt has had N/V/D since Monday on 01/28/23 ---mutiple staff and clients at [facility] has had GI bugs/Gi symptoms over last few days    -Assessment and Plan: 1)SIRS--- fevers without identifiable etiology at this time in the setting of vomiting and diarrhea -- Continue IV cefepime  pending blood culture data -Okay to discontinue vancomycin --Chest x-ray without acute findings -WBC 8.1  --UA is not suggestive of UTI -COVID, influenza and RSV negative -Lactic acid 2.3, repeat lactic acid 2.4 -IV fluids as ordered until oral intake improves especially given recent vomiting or diarrhea   2)N/V/D --- Nausea and Vomiting since 01/28/2023  -As per pt's Sister pt has had N/V/D since Monday on 01/28/23 ---mutiple staff and clients at [facility] has had GI bugs/Gi symptoms over last few days --Apparently emesis was without blood or bile -Stools were watery without blood or mucus -Sister reports no further emesis or diarrhea over the last 12 hours. = Check-Stool for C. difficile and stool culture/GI pathogen when able 01/31/23 -No further vomiting or diarrhea, tolerating oral intake well   3)Vascular Dementia---Patient has advanced vascular dementia and at baseline she can make vocal sounds but she is really  nonverbal -Patient has a history of global developmental delay -Atorvastatin and Aspirin as ordered   4)Acute Metabolic Encephalopathy--- due to above Sister [name] at bedside and states that pt is improving from a mentation standpoint but not back to her usual self.   5)Depression/Anxiety--- continue Depakote, and Zoloft   6)HTN-stable, hold Losartan in the setting of vomiting and diarrhea with risk for AKI/dehydration  7)Social/Ethics---Discussed with sister [name]...Marland Kitchen  pt is a DNR/DNI without limitation of treatment   Status is: Inpatient   Disposition: The patient is from: SNF              Anticipated d/c is to: SNF              Anticipated d/c date is: 1 day              Patient currently is not medically stable to d/c. Barriers: Not Clinically Stable-   Code Status :  -  Code Status: DNR   Family Communication:   Discussed with sister [name]  DVT Prophylaxis  :   - SCDs  heparin injection 5,000 Units Start: 01/30/23 2200 SCDs Start: 01/30/23 1658 Place TED hose Start: 01/30/23 1658   Lab Results  Component Value Date   PLT 129 (L) 01/31/2023    Inpatient Medications  Scheduled Meds:  aspirin EC  81 mg Oral Q breakfast   atorvastatin  20 mg Oral QPM   divalproex  250 mg Oral TID   folic acid  1 mg Oral Daily   heparin  5,000 Units Subcutaneous Q8H   potassium chloride  40 mEq  Oral Q3H   sertraline  25 mg Oral q1800   sodium chloride flush  3 mL Intravenous Q12H   sodium chloride flush  3 mL Intravenous Q12H   thiamine  100 mg Oral Daily   Continuous Infusions:  sodium chloride     sodium chloride 125 mL/hr at 01/31/23 0331   ceFEPime (MAXIPIME) IV 2 g (01/31/23 0922)   magnesium sulfate bolus IVPB     PRN Meds:.sodium chloride, acetaminophen **OR** acetaminophen, bisacodyl, ondansetron **OR** ondansetron (ZOFRAN) IV, polyethylene glycol, sodium chloride flush, traZODone   Anti-infectives (From admission, onward)    Start     Dose/Rate Route Frequency  Ordered Stop   01/31/23 1000  vancomycin (VANCOREADY) IVPB 750 mg/150 mL  Status:  Discontinued        750 mg 150 mL/hr over 60 Minutes Intravenous Every 24 hours 01/30/23 1109 01/31/23 1016   01/30/23 2000  ceFEPIme (MAXIPIME) 2 g in sodium chloride 0.9 % 100 mL IVPB        2 g 200 mL/hr over 30 Minutes Intravenous Every 12 hours 01/30/23 1109     01/30/23 0930  metroNIDAZOLE (FLAGYL) IVPB 500 mg        500 mg 100 mL/hr over 60 Minutes Intravenous  Once 01/30/23 0915 01/30/23 1146   01/30/23 0930  ceFEPIme (MAXIPIME) 2 g in sodium chloride 0.9 % 100 mL IVPB        2 g 200 mL/hr over 30 Minutes Intravenous  Once 01/30/23 0925 01/30/23 1033   01/30/23 0930  vancomycin (VANCOCIN) IVPB 1000 mg/200 mL premix        1,000 mg 200 mL/hr over 60 Minutes Intravenous  Once 01/30/23 0925 01/30/23 1250         Subjective: Theora Gianotti today has no further fevers, no further emesis,  No chest pain,   - Sister [name] at bedside and states that pt is improving from a mentation standpoint but not back to her usual self.   Objective: Vitals:   01/30/23 1700 01/30/23 2011 01/31/23 0529 01/31/23 1258  BP:  (!) 118/58 (!) 116/53 108/62  Pulse:  73 76 72  Resp:  Temp: 99 F (37.2 C) 97.8 F (36.6 C) 98.1 F (36.7 C) 98 F (36.7 C)  TempSrc:  Oral  Oral  SpO2:   100%   Weight:      Height:        Intake/Output Summary (Last 24 hours) at 01/31/2023 1359 Last data filed at 01/31/2023 0500 Gross per 24 hour  Intake 2202.75 ml  Output 600 ml  Net 1602.75 ml   Filed Weights   01/30/23 0850  Weight: 54.9 kg   Physical Exam Gen:- More Awake, More  Alert, even at baseline patient is not really verbal  HEENT:- [place].AT, No sclera icterus Mouth---Edentulous Supple Neck,No JVD,.  Lungs-  CTAB , fair symmetrical air movement CV- S1, S2 normal, regular  Abd-  +ve B.Sounds, Abd Soft, No tenderness,    Extremity/Skin:- No  edema, pedal pulses present  Psych-affect is appropriate,  baseline cognitive and memory deficits consistent with advanced dementia Neuro-Generalized weakness, no new focal deficits, no tremors  Data Reviewed: I have personally reviewed following labs and imaging studies  CBC: Recent Labs  Lab 01/30/23 0945 01/31/23 0413  WBC 8.1 12.1*  NEUTROABS 7.0  --   HGB 13.5 10.9*  HCT 43.1 35.1*  MCV 84.3 85.2  PLT 174 129*   Basic Metabolic Panel: Recent Labs  Lab 01/30/23  0945 01/31/23 0413  NA 137 137  K 3.4* 3.0*  CL 101 106  CO2 24 24  GLUCOSE 168* 72  BUN 29* 19  CREATININE 1.06* 0.82  CALCIUM 9.0 7.8*  MG  --  1.3*   GFR: Estimated Creatinine Clearance: 48.3 mL/min (by C-G formula based on SCr of 0.82 mg/dL). Liver Function Tests: Recent Labs  Lab 01/30/23 0945 01/31/23 0413  AST 32 27  ALT 25 26  ALKPHOS 54 41  BILITOT 0.7 0.5  PROT 7.1 5.3*  ALBUMIN 3.8 2.7*   Recent Results (from the past 240 hour(s))  Blood Culture (routine x 2)     Status: None (Preliminary result)   Collection Time: 01/30/23  9:45 AM   Specimen: BLOOD  Result Value Ref Range Status   Specimen Description BLOOD BLOOD LEFT ARM  Final   Special Requests   Final    BOTTLES DRAWN AEROBIC AND ANAEROBIC Blood Culture adequate volume   Culture   Final    NO GROWTH < 24 HOURS Performed at [facility], [address] [place], [place] [address]    Report Status PENDING  Incomplete  Blood Culture (routine x 2)     Status: None (Preliminary result)   Collection Time: 01/30/23  9:55 AM   Specimen: BLOOD  Result Value Ref Range Status   Specimen Description BLOOD BLOOD RIGHT ARM  Final   Special Requests   Final    BOTTLES DRAWN AEROBIC AND ANAEROBIC Blood Culture results may not be optimal due to an excessive volume of blood received in culture bottles   Culture   Final    NO GROWTH < 24 HOURS Performed at [facility], [address] [place], [place] [address]    Report Status PENDING  Incomplete  Resp panel by RT-PCR (RSV, Flu A&B, Covid)  Anterior Nasal Swab     Status: None   Collection Time: 01/30/23 10:01 AM   Specimen: Anterior Nasal Swab  Result Value Ref Range Status   SARS Coronavirus 2 by RT PCR NEGATIVE NEGATIVE Final    Comment: (NOTE) SARS-CoV-2 target nucleic acids are NOT DETECTED.  The SARS-CoV-2 RNA is generally detectable in upper respiratory specimens during the acute phase of infection. The lowest concentration of SARS-CoV-2 viral copies this assay can detect is 138 copies/mL. A negative result does not preclude SARS-Cov-2 infection and should not be used as the sole basis for treatment or other patient management decisions. A negative result may occur with  improper specimen collection/handling, submission of specimen other than nasopharyngeal swab, presence of viral mutation(s) within the areas targeted by this assay, and inadequate number of viral copies(<138 copies/mL). A negative result must be combined with clinical observations, patient history, and epidemiological information. The expected result is Negative.  Fact Sheet for Patients:  BloggerCourse.com  Fact Sheet for Healthcare Providers:  SeriousBroker.it  This test is no t yet approved or cleared by the [place] FDA and  has been authorized for detection and/or diagnosis of SARS-CoV-2 by FDA under an Emergency Use Authorization (EUA). This EUA will remain  in effect (meaning this test can be used) for the duration of the COVID-19 declaration under Section 564(b)(1) of the Act, 21 U.S.C.section 360bbb-3(b)(1), unless the authorization is terminated  or revoked sooner.       Influenza A by PCR NEGATIVE NEGATIVE Final   Influenza B by PCR NEGATIVE NEGATIVE Final    Comment: (NOTE) The Xpert Xpress SARS-CoV-2/FLU/RSV plus assay is intended as an aid in  the diagnosis of influenza from Nasopharyngeal swab specimens and should not be used as a sole basis for treatment. Nasal washings  and aspirates are unacceptable for Xpert Xpress SARS-CoV-2/FLU/RSV testing.  Fact Sheet for Patients: BloggerCourse.com  Fact Sheet for Healthcare Providers: SeriousBroker.it  This test is not yet approved or cleared by the [place] FDA and has been authorized for detection and/or diagnosis of SARS-CoV-2 by FDA under an Emergency Use Authorization (EUA). This EUA will remain in effect (meaning this test can be used) for the duration of the COVID-19 declaration under Section 564(b)(1) of the Act, 21 U.S.C. section 360bbb-3(b)(1), unless the authorization is terminated or revoked.     Resp Syncytial Virus by PCR NEGATIVE NEGATIVE Final    Comment: (NOTE) Fact Sheet for Patients: BloggerCourse.com  Fact Sheet for Healthcare Providers: SeriousBroker.it  This test is not yet approved or cleared by the [place] FDA and has been authorized for detection and/or diagnosis of SARS-CoV-2 by FDA under an Emergency Use Authorization (EUA). This EUA will remain in effect (meaning this test can be used) for the duration of the COVID-19 declaration under Section 564(b)(1) of the Act, 21 U.S.C. section 360bbb-3(b)(1), unless the authorization is terminated or revoked.  Performed at [facility], [address] [place], [place] [address]     Radiology Studies: [facility] 1 View  Result Date: 01/30/2023 CLINICAL DATA:  Emesis and possible altered mental status. EXAM: PORTABLE CHEST 1 VIEW COMPARISON:  Chest radiograph 09/07/2017 FINDINGS: The cardiomediastinal silhouette is stable. Mitral annular calcifications are noted. There is no focal consolidation or pulmonary edema. There is no pleural effusion or pneumothorax There is no acute osseous abnormality. IMPRESSION: No radiographic evidence of acute cardiopulmonary process. Electronically Signed   By: Lesia Hausen M.D.   On:  01/30/2023 10:17    Scheduled Meds:  aspirin EC  81 mg Oral Q breakfast   atorvastatin  20 mg Oral QPM   divalproex  250 mg Oral TID   folic acid  1 mg Oral Daily   heparin  5,000 Units Subcutaneous Q8H   potassium chloride  40 mEq Oral Q3H   sertraline  25 mg Oral q1800   sodium chloride flush  3 mL Intravenous Q12H   sodium chloride flush  3 mL Intravenous Q12H   thiamine  100 mg Oral Daily   Continuous Infusions:  sodium chloride     sodium chloride 125 mL/hr at 01/31/23 0331   ceFEPime (MAXIPIME) IV 2 g (01/31/23 0922)   magnesium sulfate bolus IVPB      LOS: 0 days   Shon Hale M.D on 01/31/2023 at 1:59 PM  Go to [email or web address] - for contact info  [facility] - Office  [phone]  If 7PM-7AM, please contact night-coverage [email or web address] 01/31/2023, 1:59 PM

## 2023-02-01 ENCOUNTER — Inpatient Hospital Stay (HOSPITAL_COMMUNITY): Payer: Medicare Other

## 2023-02-01 DIAGNOSIS — A0811 Acute gastroenteropathy due to Norwalk agent: Secondary | ICD-10-CM | POA: Diagnosis present

## 2023-02-01 DIAGNOSIS — R6510 Systemic inflammatory response syndrome (SIRS) of non-infectious origin without acute organ dysfunction: Secondary | ICD-10-CM | POA: Diagnosis not present

## 2023-02-01 LAB — GASTROINTESTINAL PANEL BY PCR, STOOL (REPLACES STOOL CULTURE)

## 2023-02-01 LAB — C DIFFICILE QUICK SCREEN W PCR REFLEX
C Diff antigen: NEGATIVE
C Diff interpretation: NOT DETECTED
C Diff toxin: NEGATIVE

## 2023-02-01 LAB — CULTURE, BLOOD (ROUTINE X 2): Culture: NO GROWTH

## 2023-02-01 LAB — OCCULT BLOOD GASTRIC / DUODENUM (SPECIMEN CUP)
Occult Blood, Gastric: NEGATIVE
pH, Gastric: 7

## 2023-02-01 MED ORDER — SODIUM CHLORIDE 0.9 % IV SOLN
500.0000 mg | INTRAVENOUS | Status: DC
  Administered 2023-02-01: 500 mg via INTRAVENOUS
  Filled 2023-02-01: qty 5

## 2023-02-01 NOTE — Care Management Important Message (Signed)
Important Message  Patient Details  Name: Norma Ayers MRN: [id number] Date of Birth: 11/24/1950   Medicare Important Message Given:  N/A - LOS <3 / Initial given by admissions     Corey Harold 02/01/2023, 2:26 PM

## 2023-02-01 NOTE — Progress Notes (Signed)
Patient vomited x2, smeared it all over her face. Emesis Norma Ayers/coffee grounds in color. She also had loose stool x2. Nursing staff bathed patient and sat her up in bed. Patient coughed and some expiratory wheezing heard bilaterally. Patient's O2 saturation decreased from 97% to 93%. Nurse notified Dr. Thomes Dinning and suggested chest x-ray to rule out aspiration. New orders for occult emesis and chest x-ray. Nurse gave PRN zofran. GI panel/C-diff and emesis occult collected and sent to lab. Chest x-ray results in chart.

## 2023-02-01 NOTE — Progress Notes (Signed)
PROGRESS NOTE  Norma Ayers, is a 72 y.o. female, DOB - 1951-02-25, JWJ:[id number]  Admit date - 01/30/2023   Admitting Physician Kary Colaizzi Mariea Clonts, MD  Outpatient Primary MD for the patient is Norma Ayers  LOS - 1  Chief Complaint  Patient presents with   Emesis      Brief Narrative:   71 y.o. female with past medical history relevant for global developmental delay, chronic alcoholism, depression anxiety and Vascular Dementia who presents from [facility] facility with concerns about being somewhat altered compared to baseline and is found to have fevers and SIRS  in the ED in the setting of nausea/vomiting and diarrhea.  -As per pt's Sister pt has had N/V/D since Monday on 01/28/23 ---mutiple staff and clients at [facility] has had GI bugs/Gi symptoms over last few days   -Assessment and Plan: 1)Sepsis due to PNA --- fevers without identifiable etiology at this time in the setting of vomiting and diarrhea -- Continue IV cefepime  pending blood culture data -Okay to discontinue vancomycin --Chest x-ray without acute findings -WBC 8.1  --UA is not suggestive of UTI -COVID, influenza and RSV negative -Lactic acid 2.3, repeat lactic acid 2.4 -IV fluids as ordered until oral intake improves especially given recent vomiting or diarrhea 02/01/23 -Overnight patient had episode of emesis... And respiratory distress -Chest x-ray suggestive of aspiration pneumonia -Continue cefepime -Okay to stop azithromycin   2)N/V/D --- Nausea and Vomiting since 01/28/2023  -As per pt's Sister pt has had N/V/D since Monday on 01/28/23 ---mutiple staff and clients at [facility] has had GI bugs/Gi symptoms over last few days --Apparently emesis was without blood or bile -Stools were watery without blood or mucus 02/01/23 -- Stool for C. difficile negative =-Stool culture/GI pathogen test with norovirus   3)Vascular Dementia---Patient has advanced vascular dementia and at  baseline she can make vocal sounds but she is really nonverbal -Patient has a history of global developmental delay -Atorvastatin and Aspirin as ordered   4)Acute Metabolic Encephalopathy--- due to above Sister Norma Ayers at bedside and states that pt is improving from a mentation standpoint but not back to her usual self.   5)Depression/Anxiety--- continue Depakote, and Zoloft   6)HTN-stable, hold Losartan in the setting of vomiting and diarrhea with risk for AKI/dehydration  7)Social/Ethics---Discussed with sister Norma Ayers...Marland Kitchen  pt is a DNR/DNI without limitation of treatment   Status is: Inpatient   Disposition: The patient is from: SNF              Anticipated d/c is to: SNF              Anticipated d/c date is: 1 day              Patient currently is not medically stable to d/c. Barriers: Not Clinically Stable-   Code Status :  -  Code Status: DNR   Family Communication:   Discussed with sister Norma Ayers  DVT Prophylaxis  :   - SCDs  heparin injection 5,000 Units Start: 01/30/23 2200 SCDs Start: 01/30/23 1658 Place TED hose Start: 01/30/23 1658   Lab Results  Component Value Date   PLT 129 (L) 01/31/2023    Inpatient Medications  Scheduled Meds:  aspirin EC  81 mg Oral Q breakfast   atorvastatin  20 mg Oral QPM   divalproex  250 mg Oral TID   folic acid  1 mg Oral Daily   heparin  5,000 Units Subcutaneous Q8H  sertraline  25 mg Oral q1800   sodium chloride flush  3 mL Intravenous Q12H   sodium chloride flush  3 mL Intravenous Q12H   thiamine  100 mg Oral Daily   Continuous Infusions:  sodium chloride     sodium chloride 125 mL/hr at 02/01/23 1932   azithromycin 500 mg (02/01/23 1000)   ceFEPime (MAXIPIME) IV 2 g (02/01/23 1935)   PRN Meds:.sodium chloride, acetaminophen **OR** acetaminophen, bisacodyl, ondansetron **OR** ondansetron (ZOFRAN) IV, polyethylene glycol, sodium chloride flush, traZODone   Anti-infectives (From admission, onward)    Start     Dose/Rate  Route Frequency Ordered Stop   02/01/23 1000  azithromycin (ZITHROMAX) 500 mg in sodium chloride 0.9 % 250 mL IVPB        500 mg 250 mL/hr over 60 Minutes Intravenous Every 24 hours 02/01/23 0853     01/31/23 1000  vancomycin (VANCOREADY) IVPB 750 mg/150 mL  Status:  Discontinued        750 mg 150 mL/hr over 60 Minutes Intravenous Every 24 hours 01/30/23 1109 01/31/23 1016   01/30/23 2000  ceFEPIme (MAXIPIME) 2 g in sodium chloride 0.9 % 100 mL IVPB        2 g 200 mL/hr over 30 Minutes Intravenous Every 12 hours 01/30/23 1109     01/30/23 0930  metroNIDAZOLE (FLAGYL) IVPB 500 mg        500 mg 100 mL/hr over 60 Minutes Intravenous  Once 01/30/23 0915 01/30/23 1146   01/30/23 0930  ceFEPIme (MAXIPIME) 2 g in sodium chloride 0.9 % 100 mL IVPB        2 g 200 mL/hr over 30 Minutes Intravenous  Once 01/30/23 0925 01/30/23 1033   01/30/23 0930  vancomycin (VANCOCIN) IVPB 1000 mg/200 mL premix        1,000 mg 200 mL/hr over 60 Minutes Intravenous  Once 01/30/23 0925 01/30/23 1250         Subjective: Norma Ayers today has no further fevers,  No chest pain,   - Overnight patient had episode of emesis... And respiratory distress -Chest x-ray suggestive of aspiration pneumonia   Objective: Vitals:   01/31/23 2112 02/01/23 0526 02/01/23 1223 02/01/23 1959  BP: 120/67 (!) 152/78 133/77 133/82  Pulse:  80 80 81  Resp: 18 20  18   Temp: 98.2 F (36.8 C) 98.6 F (37 C)  98.7 F (37.1 C)  TempSrc:    Oral  SpO2: 95% 94%  96%  Weight:      Height:        Intake/Output Summary (Last 24 hours) at 02/01/2023 2114 Last data filed at 02/01/2023 1500 Gross per 24 hour  Intake 750 ml  Output --  Net 750 ml   Filed Weights   01/30/23 0850  Weight: 54.9 kg   Physical Exam Gen:- More Awake, More  Alert, even at baseline patient is not really verbal  HEENT:- [place].AT, No sclera icterus Mouth---Edentulous Supple Neck,No JVD,.  Lungs-diminished breath sounds with few scattered rhonchi  bilaterally  CV- S1, S2 normal, regular  Abd-  +ve B.Sounds, Abd Soft, No tenderness,    Extremity/Skin:- No  edema, pedal pulses present  Psych-affect is appropriate, baseline cognitive and memory deficits consistent with advanced dementia Neuro-Generalized weakness, no new focal deficits, no tremors  Data Reviewed: I have personally reviewed following labs and imaging studies  CBC: Recent Labs  Lab 01/30/23 0945 01/31/23 0413  WBC 8.1 12.1*  NEUTROABS 7.0  --   HGB 13.5 10.9*  HCT 43.1 35.1*  MCV 84.3 85.2  PLT 174 129*   Basic Metabolic Panel: Recent Labs  Lab 01/30/23 0945 01/31/23 0413  NA 137 137  K 3.4* 3.0*  CL 101 106  CO2 24 24  GLUCOSE 168* 72  BUN 29* 19  CREATININE 1.06* 0.82  CALCIUM 9.0 7.8*  MG  --  1.3*   GFR: Estimated Creatinine Clearance: 48.3 mL/min (by C-G formula based on SCr of 0.82 mg/dL). Liver Function Tests: Recent Labs  Lab 01/30/23 0945 01/31/23 0413  AST 32 27  ALT 25 26  ALKPHOS 54 41  BILITOT 0.7 0.5  PROT 7.1 5.3*  ALBUMIN 3.8 2.7*   Recent Results (from the past 240 hour(s))  Blood Culture (routine x 2)     Status: None (Preliminary result)   Collection Time: 01/30/23  9:45 AM   Specimen: BLOOD  Result Value Ref Range Status   Specimen Description BLOOD BLOOD LEFT ARM  Final   Special Requests   Final    BOTTLES DRAWN AEROBIC AND ANAEROBIC Blood Culture adequate volume   Culture   Final    NO GROWTH 2 DAYS Performed at [facility], [address], [place], [place] [address]    Report Status PENDING  Incomplete  Blood Culture (routine x 2)     Status: None (Preliminary result)   Collection Time: 01/30/23  9:55 AM   Specimen: BLOOD  Result Value Ref Range Status   Specimen Description BLOOD BLOOD RIGHT ARM  Final   Special Requests   Final    BOTTLES DRAWN AEROBIC AND ANAEROBIC Blood Culture results may not be optimal due to an excessive volume of blood received in culture bottles   Culture   Final    NO GROWTH 2  DAYS Performed at [facility], [address], [place], [place] [address]    Report Status PENDING  Incomplete  Resp panel by RT-PCR (RSV, Flu A&B, Covid) Anterior Nasal Swab     Status: None   Collection Time: 01/30/23 10:01 AM   Specimen: Anterior Nasal Swab  Result Value Ref Range Status   SARS Coronavirus 2 by RT PCR NEGATIVE NEGATIVE Final    Comment: (NOTE) SARS-CoV-2 target nucleic acids are NOT DETECTED.  The SARS-CoV-2 RNA is generally detectable in upper respiratory specimens during the acute phase of infection. The lowest concentration of SARS-CoV-2 viral copies this assay can detect is 138 copies/mL. A negative result does not preclude SARS-Cov-2 infection and should not be used as the sole basis for treatment or other patient management decisions. A negative result may occur with  improper specimen collection/handling, submission of specimen other than nasopharyngeal swab, presence of viral mutation(s) within the areas targeted by this assay, and inadequate number of viral copies(<138 copies/mL). A negative result must be combined with clinical observations, patient history, and epidemiological information. The expected result is Negative.  Fact Sheet for Patients:  BloggerCourse.com  Fact Sheet for Healthcare Providers:  SeriousBroker.it  This test is no t yet approved or cleared by the [place] FDA and  has been authorized for detection and/or diagnosis of SARS-CoV-2 by FDA under an Emergency Use Authorization (EUA). This EUA will remain  in effect (meaning this test can be used) for the duration of the COVID-19 declaration under Section 564(b)(1) of the Act, 21 U.S.C.section 360bbb-3(b)(1), unless the authorization is terminated  or revoked sooner.       Influenza A by PCR NEGATIVE NEGATIVE Final   Influenza B by PCR NEGATIVE  NEGATIVE Final    Comment: (NOTE) The Xpert Xpress SARS-CoV-2/FLU/RSV plus  assay is intended as an aid in the diagnosis of influenza from Nasopharyngeal swab specimens and should not be used as a sole basis for treatment. Nasal washings and aspirates are unacceptable for Xpert Xpress SARS-CoV-2/FLU/RSV testing.  Fact Sheet for Patients: BloggerCourse.com  Fact Sheet for Healthcare Providers: SeriousBroker.it  This test is not yet approved or cleared by the [place] FDA and has been authorized for detection and/or diagnosis of SARS-CoV-2 by FDA under an Emergency Use Authorization (EUA). This EUA will remain in effect (meaning this test can be used) for the duration of the COVID-19 declaration under Section 564(b)(1) of the Act, 21 U.S.C. section 360bbb-3(b)(1), unless the authorization is terminated or revoked.     Resp Syncytial Virus by PCR NEGATIVE NEGATIVE Final    Comment: (NOTE) Fact Sheet for Patients: BloggerCourse.com  Fact Sheet for Healthcare Providers: SeriousBroker.it  This test is not yet approved or cleared by the [place] FDA and has been authorized for detection and/or diagnosis of SARS-CoV-2 by FDA under an Emergency Use Authorization (EUA). This EUA will remain in effect (meaning this test can be used) for the duration of the COVID-19 declaration under Section 564(b)(1) of the Act, 21 U.S.C. section 360bbb-3(b)(1), unless the authorization is terminated or revoked.  Performed at [facility], [address], [place], [place] [address]   Gastrointestinal Panel by PCR , Stool     Status: Abnormal   Collection Time: 02/01/23  1:30 AM   Specimen: Stool  Result Value Ref Range Status   Campylobacter species NOT DETECTED NOT DETECTED Final   Plesimonas shigelloides NOT DETECTED NOT DETECTED Final   Salmonella species NOT DETECTED NOT DETECTED Final   Yersinia enterocolitica NOT DETECTED NOT DETECTED Final   Vibrio species NOT  DETECTED NOT DETECTED Final   Vibrio cholerae NOT DETECTED NOT DETECTED Final   Enteroaggregative E coli (EAEC) NOT DETECTED NOT DETECTED Final   Enteropathogenic E coli (EPEC) NOT DETECTED NOT DETECTED Final   Enterotoxigenic E coli (ETEC) NOT DETECTED NOT DETECTED Final   Shiga like toxin producing E coli (STEC) NOT DETECTED NOT DETECTED Final   Shigella/Enteroinvasive E coli (EIEC) NOT DETECTED NOT DETECTED Final   Cryptosporidium NOT DETECTED NOT DETECTED Final   Cyclospora cayetanensis NOT DETECTED NOT DETECTED Final   Entamoeba histolytica NOT DETECTED NOT DETECTED Final   Giardia lamblia NOT DETECTED NOT DETECTED Final   Adenovirus F40/41 NOT DETECTED NOT DETECTED Final   Astrovirus NOT DETECTED NOT DETECTED Final   Norovirus GI/GII DETECTED (A) NOT DETECTED Final    Comment: RESULT CALLED TO, READ BACK BY AND VERIFIED WITH: JUDY BELL AT 1738 ON 02/01/23 BY SS    Rotavirus A NOT DETECTED NOT DETECTED Final   Sapovirus (I, II, IV, and V) NOT DETECTED NOT DETECTED Final    Comment: Performed at [facility], [address] [place], [place] [address]  C Difficile Quick Screen w PCR reflex     Status: None   Collection Time: 02/01/23  1:30 AM   Specimen: Stool  Result Value Ref Range Status   C Diff antigen NEGATIVE NEGATIVE Final   C Diff toxin NEGATIVE NEGATIVE Final   C Diff interpretation No C. difficile detected.  Final    Comment: Performed at [facility], [address] [place], [place] [address]    Radiology Studies: DG ABD ACUTE 2+V W 1V CHEST  Result Date: 02/01/2023 CLINICAL DATA:  Persistent  emesis EXAM: DG ABDOMEN ACUTE WITH 1 VIEW CHEST COMPARISON:  Chest x-ray 01/31/2023 FINDINGS: Chest x-ray is rotated to the left. Patient is tilted. Stable cardiopericardial silhouette with calcified aorta. Chronic appearing interstitial changes. Right-sided pleural thickening. The right-sided rib fractures are again noted. Subtle opacity left lung base. No pneumothorax.  Abdominal x-rays are also with patient rotated and tilted. Gas is seen in nondilated loops of small and large bowel. No obvious free air seen beneath the diaphragm on the upright view. No frank obstruction. Degenerative changes of the spine. Bilateral hip arthroplasties. IMPRESSION: No significant interval change for the chest x-ray. Nonspecific bowel gas pattern with diffuse bowel gas and small and large bowel. Rotated and tilted radiographs Electronically Signed   By: Karen Kays M.D.   On: 02/01/2023 10:36   DG Chest 1 View  Result Date: 02/01/2023 CLINICAL DATA:  10026. Shortness of breath. Admitted for SIRS and vomiting. EXAM: CHEST  1 VIEW COMPARISON:  Portable chest 01/30/2023 FINDINGS: There is mild cardiomegaly. There is mild central vascular fullness but no overt edema. There are mild emphysematous and chronic interstitial changes of the lungs, minimal pleural effusions. There is streaky increased opacity in the retrocardiac left base which could be atelectasis, pneumonia or aspiration. No other new opacity is seen. The mitral ring again noted heavily calcified and there is calcification in the aorta with stable mediastinum. There is no pneumothorax. Again noted is a displaced fracture of the lateral aspect of the right seventh rib with mild underlying pleural reaction, appears fairly recent, and there may also be a recent nondisplaced fracture of the lateral right sixth rib. IMPRESSION: 1. Increased opacity in the retrocardiac left base which could be atelectasis, pneumonia or aspiration. COPD. 2. Cardiomegaly with mild central vascular fullness but no overt edema. 3. Minimally displaced fracture of the lateral aspect of the right seventh rib with mild underlying pleural reaction. Possible nondisplaced fracture of the lateral right sixth rib. 4. Aortic atherosclerosis. Electronically Signed   By: Almira Bar M.D.   On: 02/01/2023 02:08    Scheduled Meds:  aspirin EC  81 mg Oral Q breakfast    atorvastatin  20 mg Oral QPM   divalproex  250 mg Oral TID   folic acid  1 mg Oral Daily   heparin  5,000 Units Subcutaneous Q8H   sertraline  25 mg Oral q1800   sodium chloride flush  3 mL Intravenous Q12H   sodium chloride flush  3 mL Intravenous Q12H   thiamine  100 mg Oral Daily   Continuous Infusions:  sodium chloride     sodium chloride 125 mL/hr at 02/01/23 1932   azithromycin 500 mg (02/01/23 1000)   ceFEPime (MAXIPIME) IV 2 g (02/01/23 1935)    LOS: 1 day   Shon Hale M.D on 02/01/2023 at 9:14 PM  Go to [email or web address] - for contact info  [facility] - Office  [phone]  If 7PM-7AM, please contact night-coverage [email or web address] 02/01/2023, 9:14 PM

## 2023-02-02 DIAGNOSIS — R6510 Systemic inflammatory response syndrome (SIRS) of non-infectious origin without acute organ dysfunction: Secondary | ICD-10-CM | POA: Diagnosis not present

## 2023-02-02 LAB — RENAL FUNCTION PANEL
Albumin: 2.4 g/dL — ABNORMAL LOW (ref 3.5–5.0)
Anion gap: 7 (ref 5–15)
BUN: 13 mg/dL (ref 8–23)
CO2: 25 mmol/L (ref 22–32)
Calcium: 7.9 mg/dL — ABNORMAL LOW (ref 8.9–10.3)
Chloride: 111 mmol/L (ref 98–111)
Creatinine, Ser: 0.74 mg/dL (ref 0.44–1.00)
GFR, Estimated: >60 mL/min (ref >60)
Glucose, Bld: 72 mg/dL (ref 70–99)
Phosphorus: 2.3 mg/dL — ABNORMAL LOW (ref 2.5–4.6)
Potassium: 4 mmol/L (ref 3.5–5.1)
Sodium: 143 mmol/L (ref 135–145)

## 2023-02-02 LAB — CBC
HCT: 33.1 % — ABNORMAL LOW (ref 36.0–46.0)
Hemoglobin: 10 g/dL — ABNORMAL LOW (ref 12.0–15.0)
MCH: 26.3 pg (ref 26.0–34.0)
MCHC: 30.2 g/dL (ref 30.0–36.0)
MCV: 87.1 fL (ref 80.0–100.0)
Platelets: 129 10*3/uL — ABNORMAL LOW (ref 150–400)
RBC: 3.8 MIL/uL — ABNORMAL LOW (ref 3.87–5.11)
RDW: 14.4 % (ref 11.5–15.5)
WBC: 8.3 10*3/uL (ref 4.0–10.5)
nRBC: 0 % (ref 0.0–0.2)

## 2023-02-02 LAB — MAGNESIUM: Magnesium: 1.8 mg/dL (ref 1.7–2.4)

## 2023-02-02 MED ORDER — CEFDINIR 300 MG PO CAPS: 300.0000 mg | ORAL_CAPSULE | Freq: Two times a day (BID) | ORAL | 0 refills | Status: AC

## 2023-02-02 MED ORDER — TRAMADOL HCL 50 MG PO TABS: 50.0000 mg | ORAL_TABLET | Freq: Three times a day (TID) | ORAL | 1 refills | Status: AC | PRN

## 2023-02-02 MED ORDER — ASPIRIN 81 MG PO TBEC: 81.0000 mg | DELAYED_RELEASE_TABLET | Freq: Every day | ORAL | 12 refills | Status: AC

## 2023-02-02 MED ORDER — AZITHROMYCIN 500 MG PO TABS: 500.0000 mg | ORAL_TABLET | Freq: Every day | ORAL | 0 refills | Status: AC

## 2023-02-02 MED ORDER — LACTINEX PO CHEW: 1.0000 | CHEWABLE_TABLET | Freq: Three times a day (TID) | ORAL | 0 refills | Status: AC

## 2023-02-02 NOTE — Progress Notes (Signed)
Pharmacy Antibiotic Note  Norma Ayers is a 72 y.o. female admitted on 01/30/2023 with  altered mental status and rectal temp of 104 .  Pharmacy has been consulted for cefepime dosing.  WBC wnl. Renal function normal.  4/17 Bcx: ngtd 4/17 Cdiff: neg GI panel: + norovirus  Cefepime 4/17>> Vanc 4/17>4/18 Flagyl x1 Azith x1  Plan: Continue Cefepime 2gm IV q 12 F/u cultures and LOT   Temp (24hrs), Avg:98.6 F (37 C), Min:98.5 F (36.9 C), Max:98.7 F (37.1 C)  Recent Labs  Lab 01/30/23 0911 01/30/23 0945 01/30/23 1137 01/31/23 0413 02/02/23 0351  WBC  --  8.1  --  12.1* 8.3  CREATININE  --  1.06*  --  0.82 0.74  LATICACIDVEN 2.3*  --  2.4*  --   --     Estimated Creatinine Clearance: 49.5 mL/min (by C-G formula based on SCr of 0.74 mg/dL).    No Known Allergies  Thank you for allowing pharmacy to be a part of this patient's care.  Caryl Asp, PharmD Clinical Pharmacist 02/02/2023 9:49 AM

## 2023-02-02 NOTE — Progress Notes (Signed)
Nsg Discharge Note  Admit Date:  01/30/2023 Discharge date: 02/02/2023   Norma Ayers to be D/C'd Skilled nursing facility per MD order.  AVS completed Patient/caregiver able to verbalize understanding.  Discharge Medication: Allergies as of 02/02/2023   No Known Allergies      Medication List     STOP taking these medications    losartan 25 MG tablet Commonly known as: COZAAR       TAKE these medications    acetaminophen 325 MG tablet Commonly known as: TYLENOL Take 650 mg by mouth every 6 (six) hours as needed for mild pain.   aspirin EC 81 MG tablet Take 1 tablet (81 mg total) by mouth daily with breakfast. Swallow whole. Start taking on: February 03, 2023   atorvastatin 20 MG tablet Commonly known as: LIPITOR Take 20 mg by mouth every evening.   azithromycin 500 MG tablet Commonly known as: ZITHROMAX Take 1 tablet (500 mg total) by mouth daily for 3 days.   cefdinir 300 MG capsule Commonly known as: OMNICEF Take 1 capsule (300 mg total) by mouth 2 (two) times daily for 5 days.   divalproex 125 MG capsule Commonly known as: DEPAKOTE SPRINKLE Take 250 mg by mouth 3 (three) times daily.   eucerin lotion Apply 1 Application topically as needed for dry skin.   folic acid 1 MG tablet Commonly known as: FOLVITE Take 1 tablet (1 mg total) by mouth daily.   lactobacillus acidophilus & bulgar chewable tablet Chew 1 tablet by mouth 3 (three) times daily with meals.   loratadine 10 MG tablet Commonly known as: CLARITIN Take 10 mg by mouth daily.   sertraline 50 MG tablet Commonly known as: ZOLOFT Take 25 mg by mouth daily.   thiamine 100 MG tablet Commonly known as: Vitamin B-1 Take 100 mg by mouth daily.   traMADol 50 MG tablet Commonly known as: ULTRAM Take 1 tablet (50 mg total) by mouth every 8 (eight) hours as needed for severe pain.   traZODone 50 MG tablet Commonly known as: DESYREL Take 25 mg by mouth at bedtime.        Discharge  Assessment: Vitals:   02/01/23 1959 02/02/23 0355  BP: 133/82 110/86  Pulse: 81 77  Resp: 18 20  Temp: 98.7 F (37.1 C) 98.5 F (36.9 C)  SpO2: 96% 100%   Skin clean, dry and intact without evidence of skin break down, no evidence of skin tears noted. IV catheter discontinued intact. Site without signs and symptoms of complications - no redness or edema noted at insertion site, patient denies c/o pain - only slight tenderness at site.  Dressing with slight pressure applied.  D/c Instructions-Education: Discharge instructions given to patient/family with verbalized understanding. D/c education completed with patient/family including follow up instructions, medication list, d/c activities limitations if indicated, with other d/c instructions as indicated by MD - patient able to verbalize understanding, all questions fully answered. Patient instructed to return to ED, call 911, or call MD for any changes in condition.  Patient escorted via WC, and D/C home via private auto.  Laurena Spies, RN 02/02/2023 2:50 PM

## 2023-02-02 NOTE — Progress Notes (Signed)
Report called to [facility]

## 2023-02-02 NOTE — TOC Transition Note (Addendum)
Transition of Care [facility]) - CM/SW Discharge Note   Patient Details  Name: Norma Ayers MRN: [id number] Date of Birth: 10/21/1950  [facility] of [facility]) CM/SW Contact:  Catalina Gravel, LCSW Phone Number: 02/02/2023, 3:20 PM   Clinical Narrative:     Pt returning to SNF Fax discharge summary to facility at [phone] and email [email or web address]  Final next level of care: Skilled Nursing Facility ([facility]) Barriers to Discharge: No Barriers Identified   Patient Goals and CMS Choice      Discharge Placement    Pt ready for DC and return to [facility]. Faxed DS to [facility] and emailed secure mail address. CSW called facility and advised pt returning.  Called for medical transportation and added to the list. Called pt. Sister and advised. Pt Dc today [facility].                  Patient and family notified of of transfer: 02/02/23 (Called sister [name] left message.)  Discharge Plan and Services Additional resources added to the After Visit Summary for                                       Social Determinants of Health (SDOH) Interventions SDOH Screenings   Food Insecurity: No Food Insecurity (01/30/2023)  Housing: Low Risk  (01/30/2023)  Transportation Needs: No Transportation Needs (01/30/2023)  Utilities: Not At Risk (01/30/2023)  Tobacco Use: Low Risk  (01/30/2023)     Readmission Risk Interventions     No data to display

## 2023-02-02 NOTE — Discharge Summary (Signed)
Norma Ayers, is a 72 y.o. female  DOB [birth year]  MRN [id number].  Admission date:  01/30/2023  Admitting Physician  Shon Hale, MD  Discharge Date:  02/02/2023   Primary MD  Lindaann Pascal  Recommendations for primary care physician for things to follow:   Repeat CBC and BMP blood test within a week advised  Admission Diagnosis  SIRS (systemic inflammatory response syndrome) [R65.10] Febrile illness [R50.9]  Discharge Diagnosis  SIRS (systemic inflammatory response syndrome) [R65.10] Febrile illness [R50.9]    Principal Problem:   SIRS (systemic inflammatory response syndrome) Active Problems:   Enteritis due to Norovirus   History of alcohol abuse      Past Medical History:  Diagnosis Date   Alcoholism 09/08/2017   Anemia    Avascular necrosis of bones of both hips 09/08/2017   Cognitive developmental delay 09/08/2017   Hyperlipidemia    Hypertension    Right orbit fracture 09/08/2017   Tongue lesion 09/08/2017   Vascular dementia with behavioral disturbance     Past Surgical History:  Procedure Laterality Date   gum tumor     removed   TOTAL HIP ARTHROPLASTY Right 10/10/2017   Procedure: RIGHT TOTAL HIP ARTHROPLASTY ANTERIOR APPROACH;  Surgeon: Samson Frederic, MD;  Location: [facility];  Service: Orthopedics;  Laterality: Right;  Needs RNFA   TOTAL HIP ARTHROPLASTY Left 11/21/2017   Procedure: LEFT TOTAL HIP ARTHROPLASTY ANTERIOR APPROACH;  Surgeon: Samson Frederic, MD;  Location: [facility];  Service: Orthopedics;  Laterality: Left;     HPI  from the history and physical done on the day of admission:     Norma Ayers  is a 72 y.o. female with past medical history relevant for global developmental delay, chronic alcoholism, depression anxiety and possible vascular dementia who presents from [facility] facility with concerns about being somewhat altered compared to baseline and is  found to have fevers in the ED    Patient has advanced vascular dementia and at baseline she can make vocal sounds but she is really nonverbal -Patient is unable to provide any history   As per pt's Sister pt has had N/V/D since Monday on 01/28/23 ---mutiple staff and clients at [facility] has had GI bugs/Gi symptoms over last few days   Sister [name] at bedside and states that pt is currently not her usual self -Apparently emesis was without blood or bile -Stools were watery without blood or mucus -Sister reports no further emesis or diarrhea over the last 12 hours. -Chest x-ray without acute findings -WBC 8.1 Hgb 13.5, platelets 174 -Potassium 3.4 creatinine 1.06, LFTs WNL -UA is not suggestive of UTI -COVID, influenza and RSV negative -Lipase 32 -Lactic acid 2.3, repeat lactic acid 2.4 -Blood cultures pending     Hospital Course:     Brief Narrative:   72 y.o. female with past medical history relevant for global developmental delay, chronic alcoholism, depression anxiety and Vascular Dementia who presents from [facility] facility with concerns about being somewhat altered  compared to baseline and is found to have fevers and SIRS  in the ED in the setting of nausea/vomiting and diarrhea.  -As per pt's Sister pt has had N/V/D since Monday on 01/28/23 ---mutiple staff and clients at [facility] has had GI bugs/Gi symptoms over last few days   -Assessment and Plan: 1)Sepsis due to PNA --- POA -Suspect aspiration component in the setting of emesis Initially treated with Vanco, azithromycin and cefepime -Initial chest x-ray on 01/30/2023 was without pneumonia -Repeat chest x-ray on 02/01/2023 was consistent with pneumonia most likely aspiration -WBC is down to 8.3 from 12.1 --UA is not suggestive of UTI -COVID, influenza and RSV negative -Lactic acid 2.3, repeat lactic acid 2.4 -Okay to discharge on azithromycin and Omnicef   2) norovirus  gastroenteritis --with N/V/D --- Nausea and Vomiting since 01/28/2023  -As per pt's Sister pt has had N/V/D since Monday on 01/28/23 ---mutiple staff and clients at [facility] has had GI bugs/Gi symptoms over last few days - emesis was without blood or bile -Stools were watery without blood or mucus -- Stool for C. difficile negative =-Stool culture/GI pathogen test with norovirus   3)Vascular Dementia---Patient has advanced vascular dementia and at baseline she can make vocal sounds but she is really nonverbal -Patient has a history of global developmental delay -Continue atorvastatin and Aspirin    4)Acute Metabolic Encephalopathy--- due to above -Resolved, -From a mentation standpoint patient is back to her usual state   5)Depression/Anxiety--- continue Depakote, and Zoloft   6)HTN-stable, stop losartan in the setting of vomiting and diarrhea with risk for AKI/dehydration   7)Social/Ethics---Discussed with sister [name]...Marland Kitchen  pt is a DNR/DNI without limitation of treatment   Disposition: The patient is from: SNF              Anticipated d/c is to: SNF   Discharge Condition: stable  Follow UP   Contact information for follow-up providers     [place], [place]. Schedule an appointment as soon as possible for a visit.   Specialty: Skilled Nursing Facility Why: Repeat CBC and BMP in week Contact information: [address] [place] [address] [phone]              Contact information for after-discharge care     Destination     [facility] .   Service: Skilled Nursing Contact information: [address] [place] [address] [phone]                    Diet and Activity recommendation:  As advised  Discharge Instructions   * Discharge Instructions     Call MD for:  difficulty breathing, headache or visual disturbances   Complete by: As directed    Call MD for:  persistant dizziness or light-headedness    Complete by: As directed    Call MD for:  persistant nausea and vomiting   Complete by: As directed    Call MD for:  temperature >100.4   Complete by: As directed    Diet - low sodium heart healthy   Complete by: As directed    Discharge instructions   Complete by: As directed    1)Repeat CBC and BMP blood test within a week advised   Increase activity slowly   Complete by: As directed          Discharge Medications     Allergies as of 02/02/2023   No Known Allergies  Medication List     STOP taking these medications    losartan 25 MG tablet Commonly known as: COZAAR       TAKE these medications    acetaminophen 325 MG tablet Commonly known as: TYLENOL Take 650 mg by mouth every 6 (six) hours as needed for mild pain.   aspirin EC 81 MG tablet Take 1 tablet (81 mg total) by mouth daily with breakfast. Swallow whole. Start taking on: February 03, 2023   atorvastatin 20 MG tablet Commonly known as: LIPITOR Take 20 mg by mouth every evening.   azithromycin 500 MG tablet Commonly known as: ZITHROMAX Take 1 tablet (500 mg total) by mouth daily for 3 days.   cefdinir 300 MG capsule Commonly known as: OMNICEF Take 1 capsule (300 mg total) by mouth 2 (two) times daily for 5 days.   divalproex 125 MG capsule Commonly known as: DEPAKOTE SPRINKLE Take 250 mg by mouth 3 (three) times daily.   eucerin lotion Apply 1 Application topically as needed for dry skin.   folic acid 1 MG tablet Commonly known as: FOLVITE Take 1 tablet (1 mg total) by mouth daily.   lactobacillus acidophilus & bulgar chewable tablet Chew 1 tablet by mouth 3 (three) times daily with meals.   loratadine 10 MG tablet Commonly known as: CLARITIN Take 10 mg by mouth daily.   sertraline 50 MG tablet Commonly known as: ZOLOFT Take 25 mg by mouth daily.   thiamine 100 MG tablet Commonly known as: Vitamin B-1 Take 100 mg by mouth daily.   traMADol 50 MG tablet Commonly known as:  ULTRAM Take 1 tablet (50 mg total) by mouth every 8 (eight) hours as needed for severe pain.   traZODone 50 MG tablet Commonly known as: DESYREL Take 25 mg by mouth at bedtime.        Major procedures and Radiology Reports - PLEASE review detailed and final reports for all details, in brief -   DG ABD ACUTE 2+V W 1V CHEST  Result Date: 02/01/2023 CLINICAL DATA:  Persistent emesis EXAM: DG ABDOMEN ACUTE WITH 1 VIEW CHEST COMPARISON:  Chest x-ray 01/31/2023 FINDINGS: Chest x-ray is rotated to the left. Patient is tilted. Stable cardiopericardial silhouette with calcified aorta. Chronic appearing interstitial changes. Right-sided pleural thickening. The right-sided rib fractures are again noted. Subtle opacity left lung base. No pneumothorax. Abdominal x-rays are also with patient rotated and tilted. Gas is seen in nondilated loops of small and large bowel. No obvious free air seen beneath the diaphragm on the upright view. No frank obstruction. Degenerative changes of the spine. Bilateral hip arthroplasties. IMPRESSION: No significant interval change for the chest x-ray. Nonspecific bowel gas pattern with diffuse bowel gas and small and large bowel. Rotated and tilted radiographs Electronically Signed   By: Karen Kays M.D.   On: 02/01/2023 10:36   DG Chest 1 View  Result Date: 02/01/2023 CLINICAL DATA:  [address]. Shortness of breath. Admitted for SIRS and vomiting. EXAM: CHEST  1 VIEW COMPARISON:  Portable chest 01/30/2023 FINDINGS: There is mild cardiomegaly. There is mild central vascular fullness but no overt edema. There are mild emphysematous and chronic interstitial changes of the lungs, minimal pleural effusions. There is streaky increased opacity in the retrocardiac left base which could be atelectasis, pneumonia or aspiration. No other new opacity is seen. The mitral ring again noted heavily calcified and there is calcification in the aorta with stable mediastinum. There is no pneumothorax.  Again noted is a displaced fracture  of the lateral aspect of the right seventh rib with mild underlying pleural reaction, appears fairly recent, and there may also be a recent nondisplaced fracture of the lateral right sixth rib. IMPRESSION: 1. Increased opacity in the retrocardiac left base which could be atelectasis, pneumonia or aspiration. COPD. 2. Cardiomegaly with mild central vascular fullness but no overt edema. 3. Minimally displaced fracture of the lateral aspect of the right seventh rib with mild underlying pleural reaction. Possible nondisplaced fracture of the lateral right sixth rib. 4. Aortic atherosclerosis. Electronically Signed   By: Almira Bar M.D.   On: 02/01/2023 02:08   DG Chest Port 1 View  Result Date: 01/30/2023 CLINICAL DATA:  Emesis and possible altered mental status. EXAM: PORTABLE CHEST 1 VIEW COMPARISON:  Chest radiograph 09/07/2017 FINDINGS: The cardiomediastinal silhouette is stable. Mitral annular calcifications are noted. There is no focal consolidation or pulmonary edema. There is no pleural effusion or pneumothorax There is no acute osseous abnormality. IMPRESSION: No radiographic evidence of acute cardiopulmonary process. Electronically Signed   By: Lesia Hausen M.D.   On: 01/30/2023 10:17    Micro Results   Recent Results (from the past 240 hour(s))  Blood Culture (routine x 2)     Status: None (Preliminary result)   Collection Time: 01/30/23  9:45 AM   Specimen: BLOOD  Result Value Ref Range Status   Specimen Description BLOOD BLOOD LEFT ARM  Final   Special Requests   Final    BOTTLES DRAWN AEROBIC AND ANAEROBIC Blood Culture adequate volume   Culture   Final    NO GROWTH 2 DAYS Performed at [facility], [address], [place], [place] [address]    Report Status PENDING  Incomplete  Blood Culture (routine x 2)     Status: None (Preliminary result)   Collection Time: 01/30/23  9:55 AM   Specimen: BLOOD  Result Value Ref Range Status   Specimen  Description BLOOD BLOOD RIGHT ARM  Final   Special Requests   Final    BOTTLES DRAWN AEROBIC AND ANAEROBIC Blood Culture results may not be optimal due to an excessive volume of blood received in culture bottles   Culture   Final    NO GROWTH 2 DAYS Performed at [facility], [address], [place], [place] [address]    Report Status PENDING  Incomplete  Resp panel by RT-PCR (RSV, Flu A&B, Covid) Anterior Nasal Swab     Status: None   Collection Time: 01/30/23 10:01 AM   Specimen: Anterior Nasal Swab  Result Value Ref Range Status   SARS Coronavirus 2 by RT PCR NEGATIVE NEGATIVE Final    Comment: (NOTE) SARS-CoV-2 target nucleic acids are NOT DETECTED.  The SARS-CoV-2 RNA is generally detectable in upper respiratory specimens during the acute phase of infection. The lowest concentration of SARS-CoV-2 viral copies this assay can detect is 138 copies/mL. A negative result does not preclude SARS-Cov-2 infection and should not be used as the sole basis for treatment or other patient management decisions. A negative result may occur with  improper specimen collection/handling, submission of specimen other than nasopharyngeal swab, presence of viral mutation(s) within the areas targeted by this assay, and inadequate number of viral copies(<138 copies/mL). A negative result must be combined with clinical observations, patient history, and epidemiological information. The expected result is Negative.  Fact Sheet for Patients:  BloggerCourse.com  Fact Sheet for Healthcare Providers:  SeriousBroker.it  This test is no t yet approved or cleared by the [place]  States FDA and  has been authorized for detection and/or diagnosis of SARS-CoV-2 by FDA under an Emergency Use Authorization (EUA). This EUA will remain  in effect (meaning this test can be used) for the duration of the COVID-19 declaration under Section 564(b)(1) of the Act,  21 U.S.C.section 360bbb-3(b)(1), unless the authorization is terminated  or revoked sooner.       Influenza A by PCR NEGATIVE NEGATIVE Final   Influenza B by PCR NEGATIVE NEGATIVE Final    Comment: (NOTE) The Xpert Xpress SARS-CoV-2/FLU/RSV plus assay is intended as an aid in the diagnosis of influenza from Nasopharyngeal swab specimens and should not be used as a sole basis for treatment. Nasal washings and aspirates are unacceptable for Xpert Xpress SARS-CoV-2/FLU/RSV testing.  Fact Sheet for Patients: BloggerCourse.com  Fact Sheet for Healthcare Providers: SeriousBroker.it  This test is not yet approved or cleared by the [place] FDA and has been authorized for detection and/or diagnosis of SARS-CoV-2 by FDA under an Emergency Use Authorization (EUA). This EUA will remain in effect (meaning this test can be used) for the duration of the COVID-19 declaration under Section 564(b)(1) of the Act, 21 U.S.C. section 360bbb-3(b)(1), unless the authorization is terminated or revoked.     Resp Syncytial Virus by PCR NEGATIVE NEGATIVE Final    Comment: (NOTE) Fact Sheet for Patients: BloggerCourse.com  Fact Sheet for Healthcare Providers: SeriousBroker.it  This test is not yet approved or cleared by the [place] FDA and has been authorized for detection and/or diagnosis of SARS-CoV-2 by FDA under an Emergency Use Authorization (EUA). This EUA will remain in effect (meaning this test can be used) for the duration of the COVID-19 declaration under Section 564(b)(1) of the Act, 21 U.S.C. section 360bbb-3(b)(1), unless the authorization is terminated or revoked.  Performed at [facility], [address] [place], [place] [address]   Gastrointestinal Panel by PCR , Stool     Status: Abnormal   Collection Time: 02/01/23  1:30 AM   Specimen: Stool  Result Value Ref Range  Status   Campylobacter species NOT DETECTED NOT DETECTED Final   Plesimonas shigelloides NOT DETECTED NOT DETECTED Final   Salmonella species NOT DETECTED NOT DETECTED Final   Yersinia enterocolitica NOT DETECTED NOT DETECTED Final   Vibrio species NOT DETECTED NOT DETECTED Final   Vibrio cholerae NOT DETECTED NOT DETECTED Final   Enteroaggregative E coli (EAEC) NOT DETECTED NOT DETECTED Final   Enteropathogenic E coli (EPEC) NOT DETECTED NOT DETECTED Final   Enterotoxigenic E coli (ETEC) NOT DETECTED NOT DETECTED Final   Shiga like toxin producing E coli (STEC) NOT DETECTED NOT DETECTED Final   Shigella/Enteroinvasive E coli (EIEC) NOT DETECTED NOT DETECTED Final   Cryptosporidium NOT DETECTED NOT DETECTED Final   Cyclospora cayetanensis NOT DETECTED NOT DETECTED Final   Entamoeba histolytica NOT DETECTED NOT DETECTED Final   Giardia lamblia NOT DETECTED NOT DETECTED Final   Adenovirus F40/41 NOT DETECTED NOT DETECTED Final   Astrovirus NOT DETECTED NOT DETECTED Final   Norovirus GI/GII DETECTED (A) NOT DETECTED Final    Comment: RESULT CALLED TO, READ BACK BY AND VERIFIED WITH: JUDY BELL AT 1738 ON 02/01/23 BY SS    Rotavirus A NOT DETECTED NOT DETECTED Final   Sapovirus (I, II, IV, and V) NOT DETECTED NOT DETECTED Final    Comment: Performed at [facility], [address], [place], [place] [address]  C Difficile Quick Screen w PCR reflex     Status: None  Collection Time: 02/01/23  1:30 AM   Specimen: Stool  Result Value Ref Range Status   C Diff antigen NEGATIVE NEGATIVE Final   C Diff toxin NEGATIVE NEGATIVE Final   C Diff interpretation No C. difficile detected.  Final    Comment: Performed at [facility], [address] [place], [place] [address]   Today   Subjective    Norma Ayers today has no new complaints -No further vomiting of diarrhea -Tolerating pured diet well No fevers -No cough or acute respiratory concerns          Patient has been seen and  examined prior to discharge   Objective   Blood pressure 110/86, pulse 77, temperature 98.5 F (36.9 C), resp. rate 20, height  (1.575 m), weight 54.9 kg, SpO2 100 %.   Intake/Output Summary (Last 24 hours) at 02/02/2023 1448 Last data filed at 02/02/2023 0930 Gross per 24 hour  Intake 570 ml  Output 700 ml  Net -130 ml   Exam Gen:-  Awake,  Alert, , in no acute distress,  at baseline patient is not really verbal  HEENT:- [place].AT, No sclera icterus Mouth---Edentulous Supple Neck,No JVD,.  Lungs-improved air movement, no wheezing CV- S1, S2 normal, regular  Abd-  +ve B.Sounds, Abd Soft, No tenderness,    Extremity/Skin:- No  edema, pedal pulses present  Psych-affect is appropriate, baseline cognitive and memory deficits consistent with advanced dementia Neuro-Generalized weakness, no new focal deficits, no tremors   Data Review   CBC w Diff:  Lab Results  Component Value Date   WBC 8.3 02/02/2023   HGB 10.0 (L) 02/02/2023   HCT 33.1 (L) 02/02/2023   PLT 129 (L) 02/02/2023   LYMPHOPCT 6 01/30/2023   MONOPCT 7 01/30/2023   EOSPCT 0 01/30/2023   BASOPCT 0 01/30/2023    CMP:  Lab Results  Component Value Date   NA 143 02/02/2023   NA 141 10/03/2017   K 4.0 02/02/2023   CL 111 02/02/2023   CO2 25 02/02/2023   BUN 13 02/02/2023   BUN 9 10/03/2017   CREATININE 0.74 02/02/2023   GLU 94 10/03/2017   PROT 5.3 (L) 01/31/2023   ALBUMIN 2.4 (L) 02/02/2023   BILITOT 0.5 01/31/2023   ALKPHOS 41 01/31/2023   AST 27 01/31/2023   ALT 26 01/31/2023  .  Total Discharge time is about 33 minutes  Shon Hale M.D on 02/02/2023 at 2:48 PM  Go to [email or web address] -  for contact info  [facility] - Office  [phone]

## 2023-02-02 NOTE — Discharge Instructions (Signed)
1)Repeat CBC and BMP blood test within a week advised

## 2023-02-03 LAB — CULTURE, BLOOD (ROUTINE X 2)
Culture: NO GROWTH
Special Requests: ADEQUATE

## 2023-05-18 ENCOUNTER — Emergency Department (HOSPITAL_COMMUNITY): Payer: Medicare Other

## 2023-05-18 ENCOUNTER — Emergency Department (HOSPITAL_COMMUNITY)
Admission: EM | Admit: 2023-05-18 | Discharge: 2023-05-19 | Disposition: A | Discharge status: Home / Self Care | Payer: Medicare Other | Attending: Emergency Medicine | Admitting: Emergency Medicine

## 2023-05-18 ENCOUNTER — Encounter (HOSPITAL_COMMUNITY): Payer: Self-pay

## 2023-05-18 ENCOUNTER — Other Ambulatory Visit: Payer: Self-pay

## 2023-05-18 DIAGNOSIS — S0993XA Unspecified injury of face, initial encounter: Secondary | ICD-10-CM | POA: Diagnosis present

## 2023-05-18 DIAGNOSIS — S0990XA Unspecified injury of head, initial encounter: Secondary | ICD-10-CM | POA: Insufficient documentation

## 2023-05-18 DIAGNOSIS — Y92129 Unspecified place in nursing home as the place of occurrence of the external cause: Secondary | ICD-10-CM | POA: Diagnosis not present

## 2023-05-18 DIAGNOSIS — F0390 Unspecified dementia without behavioral disturbance: Secondary | ICD-10-CM | POA: Insufficient documentation

## 2023-05-18 DIAGNOSIS — S0011XA Contusion of right eyelid and periocular area, initial encounter: Secondary | ICD-10-CM | POA: Diagnosis not present

## 2023-05-18 DIAGNOSIS — Z7982 Long term (current) use of aspirin: Secondary | ICD-10-CM | POA: Diagnosis not present

## 2023-05-18 DIAGNOSIS — W01198A Fall on same level from slipping, tripping and stumbling with subsequent striking against other object, initial encounter: Secondary | ICD-10-CM | POA: Diagnosis not present

## 2023-05-18 DIAGNOSIS — S3991XA Unspecified injury of abdomen, initial encounter: Secondary | ICD-10-CM | POA: Diagnosis not present

## 2023-05-18 DIAGNOSIS — W19XXXA Unspecified fall, initial encounter: Secondary | ICD-10-CM

## 2023-05-18 DIAGNOSIS — S299XXA Unspecified injury of thorax, initial encounter: Secondary | ICD-10-CM | POA: Insufficient documentation

## 2023-05-18 MED ORDER — ACETAMINOPHEN 325 MG PO TABS
650.0000 mg | ORAL_TABLET | Freq: Once | ORAL | Status: AC
  Administered 2023-05-18: 650 mg via ORAL
  Filled 2023-05-18: qty 2

## 2023-05-18 NOTE — ED Provider Notes (Signed)
[place] EMERGENCY DEPARTMENT AT [facility] Provider Note   CSN: [id number] Arrival date & time: 05/18/23  2206     History {Add pertinent medical, surgical, social history, OB history to HPI:1} Chief Complaint  Patient presents with   Norma Ayers    SOUNDRA LAMPLEY is a 72 y.o. female.   Fall  Patient presents after a fall.  Medical history includes HLD, cognitive delay, vascular dementia, anemia, arthritis, depression, anxiety.  She arrives via EMS from [facility] memory care unit.  She reportedly tripped and struck her head.  Patient arrives with gibberish speech, which is reportedly her baseline.  History is limited by patient's dementia.     Home Medications Prior to Admission medications   Medication Sig Start Date End Date Taking? Authorizing Provider  acetaminophen (TYLENOL) 325 MG tablet Take 650 mg by mouth every 6 (six) hours as needed for mild pain.    [provider]  aspirin EC 81 MG tablet Take 1 tablet (81 mg total) by mouth daily with breakfast. Swallow whole. 02/03/23   Shon Hale, MD  atorvastatin (LIPITOR) 20 MG tablet Take 20 mg by mouth every evening. 12/05/17   [provider]  divalproex (DEPAKOTE SPRINKLE) 125 MG capsule Take 250 mg by mouth 3 (three) times daily. 01/24/23   [provider]  Emollient (EUCERIN) lotion Apply 1 Application topically as needed for dry skin.    [provider]  folic acid (FOLVITE) 1 MG tablet Take 1 tablet (1 mg total) by mouth daily. 09/13/17   Zannie Cove, MD  loratadine (CLARITIN) 10 MG tablet Take 10 mg by mouth daily.    [provider]  sertraline (ZOLOFT) 50 MG tablet Take 25 mg by mouth daily.    [provider]  thiamine (VITAMIN B-1) 100 MG tablet Take 100 mg by mouth daily.    [provider]  traMADol (ULTRAM) 50 MG tablet Take 1 tablet (50 mg total) by mouth every 8 (eight) hours as needed for severe pain. 02/02/23   Shon Hale, MD   traZODone (DESYREL) 50 MG tablet Take 25 mg by mouth at bedtime.    [provider]      Allergies    Patient has no known allergies.    Review of Systems   Review of Systems  Unable to perform ROS: Dementia    Physical Exam Updated Vital Signs BP (!) 138/90 (BP Location: Left Arm)   Pulse 72   SpO2 98%  Physical Exam Vitals and nursing note reviewed.  Constitutional:      General: She is not in acute distress.    Appearance: Normal appearance. She is well-developed. She is not ill-appearing, toxic-appearing or diaphoretic.  HENT:     Head: Normocephalic.     Right Ear: External ear normal.     Left Ear: External ear normal.     Nose: Nose normal.     Mouth/Throat:     Mouth: Mucous membranes are moist.  Eyes:     Comments: Right-sided periorbital swelling and bruising.  Cardiovascular:     Rate and Rhythm: Normal rate and regular rhythm.     Heart sounds: No murmur heard. Pulmonary:     Effort: Pulmonary effort is normal. No respiratory distress.     Breath sounds: Normal breath sounds.  Chest:     Chest wall: Tenderness present.  Abdominal:     Palpations: Abdomen is soft.     Tenderness: There is abdominal tenderness.  Musculoskeletal:  General: No swelling. Normal range of motion.     Cervical back: Neck supple.  Skin:    General: Skin is warm and dry.     Coloration: Skin is not jaundiced or pale.     Findings: Bruising present.  Neurological:     General: No focal deficit present.     Mental Status: She is alert. Mental status is at baseline. She is disoriented.  Psychiatric:        Mood and Affect: Mood normal.        Behavior: Behavior normal.     ED Results / Procedures / Treatments   Labs (all labs ordered are listed, but only abnormal results are displayed) Labs Reviewed - No data to display  EKG None  Radiology No results found.  Procedures Procedures  {Document cardiac monitor, telemetry assessment procedure when  appropriate:1}  Medications Ordered in ED Medications - No data to display  ED Course/ Medical Decision Making/ A&P   {   Click here for ABCD2, HEART and other calculatorsREFRESH Note before signing :1}                              Medical Decision Making  This patient presents to the ED for concern of ***, this involves an extensive number of treatment options, and is a complaint that carries with it a high risk of complications and morbidity.  The differential diagnosis includes ***   Co morbidities that complicate the patient evaluation  ***   Additional history obtained:  Additional history obtained from *** External records from outside source obtained and reviewed including ***   Lab Tests:  I Ordered, and personally interpreted labs.  The pertinent results include:  ***   Imaging Studies ordered:  I ordered imaging studies including ***  I independently visualized and interpreted imaging which showed *** I agree with the radiologist interpretation   Cardiac Monitoring: / EKG:  The patient was maintained on a cardiac monitor.  I personally viewed and interpreted the cardiac monitored which showed an underlying rhythm of: ***   Consultations Obtained:  I requested consultation with the ***,  and discussed lab and imaging findings as well as pertinent plan - they recommend: ***   Problem List / ED Course / Critical interventions / Medication management  Patient presents after a report of a witnessed fall at her memory care unit.  On arrival in the ED, vital signs are normal.  Patient is awake and alert.  She has gibberish speech which is reportedly her baseline.  She has right-sided periorbital bruising and swelling.  She has evidence of tenderness to chest and abdomen.  Tylenol was ordered for analgesia.  Trauma workup was initiated.***. I ordered medication including ***  for ***  Reevaluation of the patient after these medicines showed that the patient  {resolved/improved/worsened:23923::"improved"} I have reviewed the patients home medicines and have made adjustments as needed   Social Determinants of Health:  ***   Test / Admission - Considered:  ***   {Document critical care time when appropriate:1} {Document review of labs and clinical decision tools ie heart score, Chads2Vasc2 etc:1}  {Document your independent review of radiology images, and any outside records:1} {Document your discussion with family members, caretakers, and with consultants:1} {Document social determinants of health affecting pt's care:1} {Document your decision making why or why not admission, treatments were needed:1} Final Clinical Impression(s) / ED Diagnoses Final diagnoses:  None  Rx / DC Orders ED Discharge Orders     None

## 2023-05-18 NOTE — ED Triage Notes (Signed)
Pt brought to ED from [facility]  Tripped, fell and hit head No LOC, no thinners Pt is at baseline per EMS EMS placed C-collar due to pt only speaking gibberish. "Normal" for pt  Pt was walking on scene  EMS BGL 165  Hematoma noted to RIGHT eye and abrasion to RIGHT knee

## 2023-05-18 NOTE — ED Notes (Signed)
Pt attempted to get out of bed several times Sitter at bedside

## 2023-05-19 DIAGNOSIS — S0011XA Contusion of right eyelid and periocular area, initial encounter: Secondary | ICD-10-CM | POA: Diagnosis not present

## 2023-05-19 LAB — CBC
HCT: 41.1 % (ref 36.0–46.0)
Hemoglobin: 12.7 g/dL (ref 12.0–15.0)
MCH: 26.1 pg (ref 26.0–34.0)
MCHC: 30.9 g/dL (ref 30.0–36.0)
MCV: 84.6 fL (ref 80.0–100.0)
Platelets: 169 10*3/uL (ref 150–400)
RBC: 4.86 MIL/uL (ref 3.87–5.11)
RDW: 14.3 % (ref 11.5–15.5)
WBC: 7.8 10*3/uL (ref 4.0–10.5)
nRBC: 0 % (ref 0.0–0.2)

## 2023-05-19 LAB — COMPREHENSIVE METABOLIC PANEL WITH GFR
ALT: 18 U/L (ref 0–44)
AST: 17 U/L (ref 15–41)
Albumin: 3.5 g/dL (ref 3.5–5.0)
Alkaline Phosphatase: 72 U/L (ref 38–126)
Anion gap: 7 (ref 5–15)
BUN: 23 mg/dL (ref 8–23)
CO2: 27 mmol/L (ref 22–32)
Calcium: 8.8 mg/dL — ABNORMAL LOW (ref 8.9–10.3)
Chloride: 105 mmol/L (ref 98–111)
Creatinine, Ser: 0.83 mg/dL (ref 0.44–1.00)
GFR, Estimated: >60 mL/min (ref >60)
Glucose, Bld: 145 mg/dL — ABNORMAL HIGH (ref 70–99)
Potassium: 4.1 mmol/L (ref 3.5–5.1)
Sodium: 139 mmol/L (ref 135–145)
Total Bilirubin: 0.1 mg/dL — ABNORMAL LOW (ref 0.3–1.2)
Total Protein: 6.6 g/dL (ref 6.5–8.1)

## 2023-05-19 MED ORDER — HALOPERIDOL LACTATE 5 MG/ML IJ SOLN
2.0000 mg | Freq: Once | INTRAMUSCULAR | Status: AC
  Administered 2023-05-19: 2 mg via INTRAVENOUS
  Filled 2023-05-19: qty 1

## 2023-05-19 NOTE — Discharge Instructions (Addendum)
Your CT scans showed no major injuries.  You have some left lung nodules that you should get reimaged in 3 to 6 months.  In the meantime, use ice on area of right eye to minimize swelling.  Take Tylenol as needed for pain and soreness.  Return to the emergency department for any new or worsening symptoms of concern.

## 2023-12-07 ENCOUNTER — Emergency Department (HOSPITAL_COMMUNITY): Payer: Medicare Other

## 2023-12-07 ENCOUNTER — Emergency Department (HOSPITAL_COMMUNITY)
Admission: EM | Admit: 2023-12-07 | Discharge: 2023-12-07 | Disposition: A | Discharge status: Home / Self Care | Payer: Medicare Other | Attending: Emergency Medicine | Admitting: Emergency Medicine

## 2023-12-07 DIAGNOSIS — S0990XA Unspecified injury of head, initial encounter: Secondary | ICD-10-CM | POA: Diagnosis present

## 2023-12-07 DIAGNOSIS — Z79899 Other long term (current) drug therapy: Secondary | ICD-10-CM | POA: Insufficient documentation

## 2023-12-07 DIAGNOSIS — Z7982 Long term (current) use of aspirin: Secondary | ICD-10-CM | POA: Insufficient documentation

## 2023-12-07 DIAGNOSIS — R079 Chest pain, unspecified: Secondary | ICD-10-CM | POA: Diagnosis not present

## 2023-12-07 DIAGNOSIS — W01198A Fall on same level from slipping, tripping and stumbling with subsequent striking against other object, initial encounter: Secondary | ICD-10-CM | POA: Diagnosis not present

## 2023-12-07 DIAGNOSIS — I10 Essential (primary) hypertension: Secondary | ICD-10-CM | POA: Insufficient documentation

## 2023-12-07 DIAGNOSIS — F0390 Unspecified dementia without behavioral disturbance: Secondary | ICD-10-CM | POA: Diagnosis not present

## 2023-12-07 DIAGNOSIS — W19XXXA Unspecified fall, initial encounter: Secondary | ICD-10-CM

## 2023-12-07 NOTE — ED Provider Notes (Signed)
 [place] EMERGENCY DEPARTMENT AT [facility] Provider Note   CSN: [id number] Arrival date & time: 12/07/23  4098     History  Chief Complaint  Patient presents with   Marletta Lor    Norma Ayers is a 73 y.o. female.   Fall  Patient with dementia.  Nonverbal at baseline.  Reportedly had fall backwards.  Hematoma to right occipital area.  Patient is at reported baseline but cannot really provide history.    Past Medical History:  Diagnosis Date   Alcoholism (HCC) 09/08/2017   Anemia    Avascular necrosis of bones of both hips (HCC) 09/08/2017   Cognitive developmental delay 09/08/2017   Hyperlipidemia    Hypertension    Right orbit fracture (HCC) 09/08/2017   Tongue lesion 09/08/2017   Vascular dementia with behavioral disturbance (HCC)     Home Medications Prior to Admission medications   Medication Sig Start Date End Date Taking? Authorizing Provider  acetaminophen (TYLENOL) 325 MG tablet Take 650 mg by mouth every 6 (six) hours as needed for mild pain.    [provider]  aspirin EC 81 MG tablet Take 1 tablet (81 mg total) by mouth daily with breakfast. Swallow whole. 02/03/23   Shon Hale, MD  atorvastatin (LIPITOR) 20 MG tablet Take 20 mg by mouth every evening. 12/05/17   [provider]  divalproex (DEPAKOTE SPRINKLE) 125 MG capsule Take 250 mg by mouth 3 (three) times daily. 01/24/23   [provider]  Emollient (EUCERIN) lotion Apply 1 Application topically as needed for dry skin.    [provider]  folic acid (FOLVITE) 1 MG tablet Take 1 tablet (1 mg total) by mouth daily. 09/13/17   Zannie Cove, MD  loratadine (CLARITIN) 10 MG tablet Take 10 mg by mouth daily.    [provider]  sertraline (ZOLOFT) 50 MG tablet Take 25 mg by mouth daily.    [provider]  thiamine (VITAMIN B-1) 100 MG tablet Take 100 mg by mouth daily.    [provider]  traMADol (ULTRAM) 50 MG tablet Take 1  tablet (50 mg total) by mouth every 8 (eight) hours as needed for severe pain. 02/02/23   Shon Hale, MD  traZODone (DESYREL) 50 MG tablet Take 25 mg by mouth at bedtime.    [provider]      Allergies    Lisinopril    Review of Systems   Review of Systems  Physical Exam Updated Vital Signs BP 137/70   Pulse 72   Temp 97.9 F (36.6 C) (Axillary)   Resp 19   SpO2 99%  Physical Exam Vitals reviewed.  HENT:     Head:     Comments: Right-sided occipital hematoma.  Some dried blood. Cardiovascular:     Rate and Rhythm: Regular rhythm.  Pulmonary:     Comments: May have some mild posterior mid chest tenderness. Musculoskeletal:     Cervical back: No tenderness.     Comments: No clear extremity tenderness.  Neurological:     Mental Status: She is alert. Mental status is at baseline.     Comments: Patient is in bed giggling.     ED Results / Procedures / Treatments   Labs (all labs ordered are listed, but only abnormal results are displayed) Labs Reviewed - No data to display  EKG None  Radiology DG Pelvis 1-2 Views Result Date: 12/07/2023 CLINICAL DATA:  73 year old female status post fall backwards. EXAM: PELVIS - 1-2 VIEW  COMPARISON:  CT Abdomen and Pelvis 05/19/2023 and earlier. FINDINGS: AP pelvis at 0743 hours. Chronic bilateral total hip arthroplasty. Osteopenia. Visible hardware appears intact and aligned. Pelvis appears stable and intact. Symmetric SI joints. Grossly intact proximal femurs. Nonobstructed bowel-gas pattern. IMPRESSION: Chronic bilateral total hip arthroplasty. No acute fracture or dislocation identified about the pelvis. Electronically Signed   By: Odessa Fleming M.D.   On: 12/07/2023 08:04   DG Chest 1 View Result Date: 12/07/2023 CLINICAL DATA:  73 year old female status post fall backwards. EXAM: CHEST  1 VIEW COMPARISON:  CT Chest, Abdomen, and Pelvis 05/19/2023 and earlier. FINDINGS: Portable AP upright view at 0741 hours. Stable lung  volumes, mediastinal contours, mild chronic left lung base hypo ventilation. No pneumothorax, pulmonary edema, acute lung opacity. Visualized tracheal air column is within normal limits. Negative visible bowel gas. No acute osseous abnormality identified. IMPRESSION: No acute cardiopulmonary abnormality or acute traumatic injury identified. Electronically Signed   By: Odessa Fleming M.D.   On: 12/07/2023 08:03   CT Cervical Spine Wo Contrast Result Date: 12/07/2023 CLINICAL DATA:  73 year old female status post fall backwards. EXAM: CT CERVICAL SPINE WITHOUT CONTRAST TECHNIQUE: Multidetector CT imaging of the cervical spine was performed without intravenous contrast. Multiplanar CT image reconstructions were also generated. RADIATION DOSE REDUCTION: This exam was performed according to the departmental dose-optimization program which includes automated exposure control, adjustment of the mA and/or kV according to patient size and/or use of iterative reconstruction technique. COMPARISON:  Head CT today.  Cervical spine CT 05/19/2023. FINDINGS: Study is mildly degraded by motion artifact despite repeated imaging attempts. Alignment: Straightening of cervical lordosis. Cervicothoracic junction alignment is within normal limits. Bilateral posterior element alignment is within normal limits. Skull base and vertebrae: Visualized skull base is intact. No atlanto-occipital dissociation. C1 and C2 appear chronically degenerated but intact and aligned. No acute osseous abnormality identified. Soft tissues and spinal canal: No prevertebral fluid or swelling. No visible canal hematoma. Calcified cervical carotid atherosclerosis. Disc levels: Cervical spine degeneration is generally age-appropriate and appears stable from last year. Upper chest: Visible upper thoracic levels appear intact. Negative lung apices. IMPRESSION: No acute traumatic injury identified in the cervical spine. Electronically Signed   By: Odessa Fleming M.D.   On:  12/07/2023 08:02   CT Head Wo Contrast Result Date: 12/07/2023 CLINICAL DATA:  73 year old female status post fall backwards. EXAM: CT HEAD WITHOUT CONTRAST TECHNIQUE: Contiguous axial images were obtained from the base of the skull through the vertex without intravenous contrast. RADIATION DOSE REDUCTION: This exam was performed according to the departmental dose-optimization program which includes automated exposure control, adjustment of the mA and/or kV according to patient size and/or use of iterative reconstruction technique. COMPARISON:  Head CT 05/19/2023. FINDINGS: Brain: Stable. No midline shift, ventriculomegaly, mass effect, evidence of mass lesion, intracranial hemorrhage or evidence of cortically based acute infarction. Stable chronic encephalomalacia in the left lateral temporal lobe. Elsewhere normal for age gray-white differentiation. Stable probable dural calcification at the right operculum. Vascular: Extensive Calcified atherosclerosis at the skull base. No suspicious intracranial vascular hyperdensity. Skull: No acute osseous abnormality identified. Chronic right orbital floor fracture. Sinuses/Orbits: Visualized paranasal sinuses and mastoids are stable and well aerated. Other: Large right lateral and posterior convexity scalp hematoma measuring up to 2.4 cm in thickness. Underlying calvarium appears stable and intact. No scalp soft tissue gas. Orbits soft tissues now appear within normal limits. IMPRESSION: 1. Large right scalp hematoma.  No acute skull fracture. 2. No acute intracranial abnormality.  Stable non contrast CT appearance of the brain. Electronically Signed   By: Odessa Fleming M.D.   On: 12/07/2023 07:59    Procedures Procedures    Medications Ordered in ED Medications - No data to display  ED Course/ Medical Decision Making/ A&P                                 Medical Decision Making Amount and/or Complexity of Data Reviewed Radiology: ordered.   Patient with fall.   Hit head with hematoma.  Will get head CT and cervical spine CT.  Will also get chest x-ray and pelvic x-ray a screening test.  No definite traumatic injury seen on anywhere except the head, but difficult to get history due to patient's dementia.  Imaging reassuring.  No laceration just some oozing on the hematoma.  Appears stable for discharge home.        Final Clinical Impression(s) / ED Diagnoses Final diagnoses:  Fall, initial encounter  Injury of head, initial encounter    Rx / DC Orders ED Discharge Orders     None         Benjiman Core, MD 12/07/23 1521

## 2023-12-07 NOTE — ED Triage Notes (Signed)
 Pt arrives via [facility] from [facility] after an unwitnessed fall. Pt was reportedly standing in the hallway and fell backwards, striking her head with a loud thud onto the ground. Staff heard the noise and quickly attended pt who was alert. Pt is not on blood thinners. Pt has hx of dementia and is non-verbal at baseline. EMS has c-collar in place and bandages d/t large hematoma and laceration to posterior scalp.

## 2024-06-20 ENCOUNTER — Emergency Department (HOSPITAL_COMMUNITY)
Admission: EM | Admit: 2024-06-20 | Discharge: 2024-06-21 | Disposition: A | Discharge status: Home / Self Care | Attending: Emergency Medicine | Admitting: Emergency Medicine

## 2024-06-20 ENCOUNTER — Other Ambulatory Visit: Payer: Self-pay

## 2024-06-20 ENCOUNTER — Encounter (HOSPITAL_COMMUNITY): Payer: Self-pay

## 2024-06-20 DIAGNOSIS — Z7982 Long term (current) use of aspirin: Secondary | ICD-10-CM | POA: Insufficient documentation

## 2024-06-20 DIAGNOSIS — S0990XA Unspecified injury of head, initial encounter: Secondary | ICD-10-CM | POA: Diagnosis present

## 2024-06-20 DIAGNOSIS — S0003XA Contusion of scalp, initial encounter: Secondary | ICD-10-CM | POA: Diagnosis not present

## 2024-06-20 DIAGNOSIS — Y92129 Unspecified place in nursing home as the place of occurrence of the external cause: Secondary | ICD-10-CM | POA: Insufficient documentation

## 2024-06-20 DIAGNOSIS — W19XXXA Unspecified fall, initial encounter: Secondary | ICD-10-CM | POA: Diagnosis not present

## 2024-06-20 MED ORDER — ACETAMINOPHEN 325 MG PO TABS
650.0000 mg | ORAL_TABLET | Freq: Once | ORAL | Status: AC
  Administered 2024-06-20: 650 mg via ORAL
  Filled 2024-06-20: qty 2

## 2024-06-20 NOTE — ED Provider Notes (Signed)
 [place] EMERGENCY DEPARTMENT AT [facility] Provider Note   CSN: [id number] Arrival date & time: 06/20/24  2203     Patient presents with: Norma Ayers   Norma Ayers is a 73 y.o. female.    Fall   This patient is a 73 year old female presenting to the hospital after having a fall at her nursing facility, she is currently at [facility], she had an unwitnessed fall and was found on the ground with a bump on the back of her head.  She is nonverbal at baseline, the patient has no other findings on her exam according to the paramedics who brought her into the hospital here for evaluation because of the hematoma.  The patient is not able to answer any questions but when I talk to her she does smile and is interactive.    Prior to Admission medications   Medication Sig Start Date End Date Taking? Authorizing Provider  acetaminophen  (TYLENOL ) 325 MG tablet Take 650 mg by mouth every 6 (six) hours as needed for mild pain.    [provider]  aspirin  EC 81 MG tablet Take 1 tablet (81 mg total) by mouth daily with breakfast. Swallow whole. 02/03/23   Pearlean Manus, MD  atorvastatin  (LIPITOR) 20 MG tablet Take 20 mg by mouth every evening. 12/05/17   [provider]  divalproex  (DEPAKOTE  SPRINKLE) 125 MG capsule Take 250 mg by mouth 3 (three) times daily. 01/24/23   [provider]  Emollient (EUCERIN) lotion Apply 1 Application topically as needed for dry skin.    [provider]  folic acid  (FOLVITE ) 1 MG tablet Take 1 tablet (1 mg total) by mouth daily. 09/13/17   Joseph, Preetha, MD  loratadine (CLARITIN) 10 MG tablet Take 10 mg by mouth daily.    [provider]  sertraline  (ZOLOFT ) 50 MG tablet Take 25 mg by mouth daily.    [provider]  thiamine  (VITAMIN B-1) 100 MG tablet Take 100 mg by mouth daily.    [provider]  traMADol  (ULTRAM ) 50 MG tablet Take 1 tablet (50 mg total) by mouth every 8 (eight) hours as  needed for severe pain. 02/02/23   Pearlean Manus, MD  traZODone  (DESYREL ) 50 MG tablet Take 25 mg by mouth at bedtime.    [provider]    Allergies: Lisinopril    Review of Systems  Unable to perform ROS: Patient nonverbal    Updated Vital Signs BP (!) 167/78 (BP Location: Right Arm)   Pulse (!) 53   Temp 97.8 F (36.6 C) (Oral)   Resp 18   Ht 1.575 m (5' 2)   Wt 55.8 kg   SpO2 100%   BMI 22.50 kg/m   Physical Exam Vitals and nursing note reviewed.  Constitutional:      General: She is not in acute distress.    Appearance: She is well-developed.  HENT:     Head: Normocephalic.     Comments: There is a 3 cm hematoma to the right posterior occiput, there is no laceration in this location although there does appear to be a spot bleeding through the skin.  She has no tenderness over the cervical spine    Mouth/Throat:     Pharynx: No oropharyngeal exudate.  Eyes:     General: No scleral icterus.       Right eye: No discharge.        Left eye: No discharge.     Conjunctiva/sclera: Conjunctivae normal.  Pupils: Pupils are equal, round, and reactive to light.  Neck:     Thyroid : No thyromegaly.     Vascular: No JVD.  Cardiovascular:     Rate and Rhythm: Normal rate and regular rhythm.     Heart sounds: Normal heart sounds. No murmur heard.    No friction rub. No gallop.  Pulmonary:     Effort: Pulmonary effort is normal. No respiratory distress.     Breath sounds: Normal breath sounds. No wheezing or rales.  Abdominal:     General: Bowel sounds are normal. There is no distension.     Palpations: Abdomen is soft. There is no mass.     Tenderness: There is no abdominal tenderness.  Musculoskeletal:        General: No tenderness. Normal range of motion.     Cervical back: Normal range of motion and neck supple.     Right lower leg: No edema.     Left lower leg: No edema.     Comments: The patient is able to move all 4 extremities and though she has  some general stiffness of her 4 extremities she is able to move them, she has soft compartments and supple joints diffusely, there is no tenderness over the spine or the neck or the pelvis, there is no leg length discrepancy, there is no shortening or external rotation  Lymphadenopathy:     Cervical: No cervical adenopathy.  Skin:    General: Skin is warm and dry.     Findings: No erythema or rash.  Neurological:     Mental Status: She is alert.     Coordination: Coordination normal.     Comments: The patient is nonverbal, she does look at me, she follows commands with her eyes, she will move both arms and both legs  Psychiatric:        Behavior: Behavior normal.     (all labs ordered are listed, but only abnormal results are displayed) Labs Reviewed - No data to display  EKG: None  Radiology: No results found.   Procedures   Medications Ordered in the ED - No data to display                                  Medical Decision Making  Other than the injury to the back of the head the patient has no other specific injuries that I can see.  She appears to be otherwise comfortable, she has vital signs which are unremarkable.  I do not see any specific focal neurologic deficits.  Of note the patient comes with a request to DO NOT RESUSCITATE.  Because of this I did call the patient's sister Ms. Winton Sage to discuss her wishes.  As this patient would be a very poor surgical candidate even if there was some abnormality on the brain I did talk to the family members and they requested that we do not do any imaging but let her go back to her facility.  They request that we do not do any interventions given her baseline disability and status neurologically.  I think this is very reasonable Reasonable    Final diagnoses:  Contusion of scalp, initial encounter    ED Discharge Orders     None          Cleotilde Rogue, MD 06/20/24 2227

## 2024-06-20 NOTE — ED Triage Notes (Signed)
 Pt BIB EMS from [facility] for an unwitnessed fall. Pt has a hematoma to the back of head with no other injuries noted. Pt does take a baby aspirin  but no other blood thinners.

## 2024-06-20 NOTE — Discharge Instructions (Signed)
 I have spoken with Norma Ayers the patient's sister who request that we do not do any x-rays or CT scans.  You can use ice packs to help with the swelling, Tylenol  for pain  Emergency department for severe worsening symptoms

## 2024-09-27 ENCOUNTER — Emergency Department (HOSPITAL_COMMUNITY)
Admission: EM | Admit: 2024-09-27 | Discharge: 2024-09-27 | Disposition: A | Discharge status: Skilled Nursing Facility | Source: Skilled Nursing Facility | Attending: Emergency Medicine | Admitting: Emergency Medicine

## 2024-09-27 ENCOUNTER — Other Ambulatory Visit: Payer: Self-pay

## 2024-09-27 DIAGNOSIS — W19XXXA Unspecified fall, initial encounter: Secondary | ICD-10-CM

## 2024-09-27 DIAGNOSIS — S0990XA Unspecified injury of head, initial encounter: Secondary | ICD-10-CM

## 2024-09-27 MED ORDER — BACITRACIN ZINC 500 UNIT/GM EX OINT
TOPICAL_OINTMENT | CUTANEOUS | Status: AC
  Filled 2024-09-27: qty 0.9

## 2024-09-27 NOTE — ED Provider Notes (Addendum)
 [place] [facility] AT [facility] Provider Note   CSN: [id number] Arrival date & time: 09/27/24  1740     Patient presents with: Norma Ayers   Norma Ayers is a 73 y.o. female.    Fall   This patient is a 73 year old female with a history of fairly advanced dementia currently living at [facility], she is nonverbal but able to ambulate, report from the nursing facility is that the patient had been found in the hallway with a small hematoma to the lateral side of her left orbit.  There was no injuries to the hand otherwise, she is not anticoagulated other than a baby aspirin .  The patient did not seem to be in distress and when the paramedics got to the scene they were able to help her to her feet and she was able to ambulate with them.  She was sent to the hospital for evaluation of her injuries.  I discussed her care with her sister, Winton Sage who states that she does not want her to have any advanced imaging and that as long as she is in good spirits and happy that she can be discharged back to the nursing facility with some local wound care to the hematoma on the left side of her face.    Prior to Admission medications  Medication Sig Start Date End Date Taking? Authorizing Provider  acetaminophen  (TYLENOL ) 325 MG tablet Take 650 mg by mouth every 6 (six) hours as needed for mild pain.    [provider]  aspirin  EC 81 MG tablet Take 1 tablet (81 mg total) by mouth daily with breakfast. Swallow whole. 02/03/23   Pearlean Manus, MD  atorvastatin  (LIPITOR) 20 MG tablet Take 20 mg by mouth every evening. 12/05/17   [provider]  divalproex  (DEPAKOTE  SPRINKLE) 125 MG capsule Take 250 mg by mouth 3 (three) times daily. 01/24/23   [provider]  Emollient (EUCERIN) lotion Apply 1 Application topically as needed for dry skin.    [provider]  folic acid  (FOLVITE ) 1 MG tablet Take 1 tablet (1 mg total) by mouth daily. 09/13/17    Joseph, Preetha, MD  loratadine (CLARITIN) 10 MG tablet Take 10 mg by mouth daily.    [provider]  sertraline  (ZOLOFT ) 50 MG tablet Take 25 mg by mouth daily.    [provider]  thiamine  (VITAMIN B-1) 100 MG tablet Take 100 mg by mouth daily.    [provider]  traMADol  (ULTRAM ) 50 MG tablet Take 1 tablet (50 mg total) by mouth every 8 (eight) hours as needed for severe pain. 02/02/23   Pearlean Manus, MD  traZODone  (DESYREL ) 50 MG tablet Take 25 mg by mouth at bedtime.    [provider]    Allergies: Lisinopril    Review of Systems  Unable to perform ROS: Dementia    Updated Vital Signs BP (!) 128/90   Pulse 72   Temp 98 F (36.7 C) (Axillary)   Resp 18   Wt 53.7 kg   SpO2 98%   BMI 21.65 kg/m   Physical Exam Vitals and nursing note reviewed.  Constitutional:      General: She is not in acute distress.    Appearance: She is well-developed.  HENT:     Head: Normocephalic.     Comments: 2 cm small hematoma to the left face lateral to the orbit on the left.  There is no injury to the eyelids or the  globe, she has normal range of motion of the eyes with a normal conjugate gaze, able to open and close her mouth without any bruising or swelling to the face there is no trismus or torticollis    Mouth/Throat:     Pharynx: No oropharyngeal exudate.  Eyes:     General: No scleral icterus.       Right eye: No discharge.        Left eye: No discharge.     Conjunctiva/sclera: Conjunctivae normal.     Pupils: Pupils are equal, round, and reactive to light.  Neck:     Thyroid : No thyromegaly.     Vascular: No JVD.  Cardiovascular:     Rate and Rhythm: Normal rate and regular rhythm.     Heart sounds: Normal heart sounds. No murmur heard.    No friction rub. No gallop.  Pulmonary:     Effort: Pulmonary effort is normal. No respiratory distress.     Breath sounds: Normal breath sounds. No wheezing or rales.  Abdominal:     General:  Bowel sounds are normal. There is no distension.     Palpations: Abdomen is soft. There is no mass.     Tenderness: There is no abdominal tenderness.  Musculoskeletal:        General: No tenderness. Normal range of motion.     Cervical back: Normal range of motion and neck supple.     Right lower leg: No edema.     Left lower leg: No edema.     Comments: The patient is able to move all 4 extremities, she is able to lift and flex both of her legs and her arms.  Lymphadenopathy:     Cervical: No cervical adenopathy.  Skin:    General: Skin is warm and dry.     Findings: No erythema or rash.  Neurological:     Mental Status: She is alert.     Coordination: Coordination normal.     Comments: The patient is nonverbal at her baseline but is seemingly happy and engaging with the examiner at the bedside.  Psychiatric:        Behavior: Behavior normal.     (all labs ordered are listed, but only abnormal results are displayed) Labs Reviewed - No data to display  EKG: None  Radiology: No results found.   .Laceration Repair  Date/Time: 09/27/2024 9:21 PM  Performed by: Cleotilde Rogue, MD Authorized by: Cleotilde Rogue, MD   Consent:    Consent obtained:  Emergent situation Universal protocol:    Immediately prior to procedure, a time out was called: yes     Patient identity confirmed:  Arm band Anesthesia:    Anesthesia method:  Local infiltration   Local anesthetic:  Lidocaine  1% WITH epi Laceration details:    Location:  Face   Length (cm):  2   Depth (mm):  4 Pre-procedure details:    Preparation:  Patient was prepped and draped in usual sterile fashion Exploration:    Hemostasis achieved with:  Direct pressure   Imaging outcome: foreign body not noted     Wound exploration: wound explored through full range of motion and entire depth of wound visualized     Wound extent: areolar tissue not violated, fascia not violated, no foreign body, no signs of injury, no nerve  damage, no tendon damage, no underlying fracture and no vascular damage     Contaminated: no   Treatment:    Area cleansed with:  Povidone-iodine    Amount of cleaning:  Standard   Irrigation solution:  Sterile saline   Debridement:  None Skin repair:    Repair method:  Sutures   Suture size:  4-0   Suture material:  Prolene   Suture technique:  Simple interrupted   Number of sutures:  2 Approximation:    Approximation:  Close Repair type:    Repair type:  Simple Post-procedure details:    Dressing:  Adhesive bandage   Procedure completion:  Tolerated well, no immediate complications Comments:          Medications Ordered in the ED - No data to display                                  Medical Decision Making  I see no signs of significant traumatic injury other than a small hematoma to the side of the face.  The patient would be a poor surgical candidate even if there was anything intracranial.  I will respect the family's wishes of not doing advanced imaging as this would not be beneficial in her overall care.  Vitals are normal, patient appears to be atraumatic otherwise and is stable for discharge back to the facility for supportive care.  The patient did have a small laceration lateral to the left eye which was closed with 2 sutures as it did continue to have a small amount of bleeding.    Final diagnoses:  Fall, initial encounter  Injury of head, initial encounter    ED Discharge Orders     None          Cleotilde Rogue, MD 09/27/24 LOMAN    Cleotilde Rogue, MD 09/27/24 2122

## 2024-09-27 NOTE — ED Triage Notes (Signed)
 Pt BIB [facility] for fall at [facility]. Pt has a laceration and bruise over left eyebrow. Pt is nonverbal. Hx of dementia. Pt takes 81mg  Asprin daily.

## 2024-09-27 NOTE — Discharge Instructions (Addendum)
 Respecting the family's wishes not to perform a CT scan, I think this is very reasonable, she appears well, has normal vital signs, you can use ice packs intermittently to help with the swelling, Tylenol  for discomfort, change the dressing twice a day and see your doctor at the facility within 3 days for recheck.  I would not be surprised if there was some spreading of the bruising on the face as this normally happens after these types of injuries
